# Patient Record
Sex: Male | Born: 1940
Health system: Southern US, Community
[De-identification: ages and names within clinical notes are randomized; demographics above are authoritative.]

## PROBLEM LIST (undated history)

## (undated) DIAGNOSIS — Z9889 Other specified postprocedural states: Secondary | ICD-10-CM

## (undated) DIAGNOSIS — I712 Thoracic aortic aneurysm, without rupture: Secondary | ICD-10-CM

## (undated) DIAGNOSIS — I10 Essential (primary) hypertension: Secondary | ICD-10-CM

## (undated) DIAGNOSIS — Z860100 Personal history of colon polyps, unspecified: Secondary | ICD-10-CM

## (undated) DIAGNOSIS — R42 Dizziness and giddiness: Secondary | ICD-10-CM

## (undated) DIAGNOSIS — R55 Syncope and collapse: Secondary | ICD-10-CM

## (undated) DIAGNOSIS — Z8679 Personal history of other diseases of the circulatory system: Secondary | ICD-10-CM

## (undated) DIAGNOSIS — I77810 Thoracic aortic ectasia: Secondary | ICD-10-CM

## (undated) DIAGNOSIS — M1990 Unspecified osteoarthritis, unspecified site: Secondary | ICD-10-CM

## (undated) DIAGNOSIS — I509 Heart failure, unspecified: Secondary | ICD-10-CM

## (undated) DIAGNOSIS — I351 Nonrheumatic aortic (valve) insufficiency: Secondary | ICD-10-CM

## (undated) DIAGNOSIS — Z953 Presence of xenogenic heart valve: Secondary | ICD-10-CM

## (undated) DIAGNOSIS — M549 Dorsalgia, unspecified: Secondary | ICD-10-CM

## (undated) DIAGNOSIS — I340 Nonrheumatic mitral (valve) insufficiency: Secondary | ICD-10-CM

## (undated) DIAGNOSIS — K219 Gastro-esophageal reflux disease without esophagitis: Secondary | ICD-10-CM

## (undated) DIAGNOSIS — N529 Male erectile dysfunction, unspecified: Secondary | ICD-10-CM

## (undated) DIAGNOSIS — M1712 Unilateral primary osteoarthritis, left knee: Secondary | ICD-10-CM

## (undated) DIAGNOSIS — E785 Hyperlipidemia, unspecified: Secondary | ICD-10-CM

## (undated) DIAGNOSIS — S82142Q Displaced bicondylar fracture of left tibia, subsequent encounter for open fracture type I or II with malunion: Secondary | ICD-10-CM

## (undated) DIAGNOSIS — M11262 Other chondrocalcinosis, left knee: Secondary | ICD-10-CM

## (undated) HISTORY — PX: TONSILLECTOMY: SUR1361

## (undated) HISTORY — DX: Nonrheumatic aortic (valve) insufficiency: I35.1

## (undated) HISTORY — PX: LUMBAR LAMINECTOMY: SHX95

## (undated) HISTORY — PX: LEG SURGERY: SHX1003

## (undated) HISTORY — DX: Male erectile dysfunction, unspecified: N52.9

## (undated) HISTORY — DX: Essential (primary) hypertension: I10

## (undated) HISTORY — DX: Nonrheumatic mitral (valve) insufficiency: I34.0

## (undated) HISTORY — PX: LUMBAR DISC SURGERY: SHX700

## (undated) HISTORY — PX: EYE SURGERY: SHX253

## (undated) HISTORY — DX: Syncope and collapse: R55

## (undated) HISTORY — DX: Other specified postprocedural states: Z98.890

## (undated) HISTORY — DX: Hyperlipidemia, unspecified: E78.5

## (undated) HISTORY — PX: COLONOSCOPY: SHX174

## (undated) HISTORY — DX: Thoracic aortic aneurysm, without rupture: I71.2

## (undated) HISTORY — DX: Heart failure, unspecified: I50.9

## (undated) HISTORY — DX: Thoracic aortic ectasia: I77.810

## (undated) HISTORY — DX: Presence of xenogenic heart valve: Z95.3

---

## 2004-07-31 ENCOUNTER — Ambulatory Visit: Payer: Self-pay | Admitting: Family Medicine

## 2005-01-18 ENCOUNTER — Ambulatory Visit: Payer: Self-pay | Admitting: Family Medicine

## 2006-06-20 ENCOUNTER — Ambulatory Visit: Payer: Self-pay | Admitting: Pulmonary Disease

## 2006-07-24 ENCOUNTER — Ambulatory Visit: Payer: Self-pay | Admitting: Cardiovascular Disease

## 2006-07-24 LAB — CONVERTED CEMR LAB
BUN: 15 mg/dL (ref 6–23)
Chloride: 107 meq/L (ref 96–112)
Creatinine, Ser: 0.8 mg/dL (ref 0.4–1.5)
GFR calc non Af Amer: 103 mL/min
INR: 0.9 (ref 0.9–2.0)
Potassium: 4.2 meq/L (ref 3.5–5.1)
Prothrombin Time: 11.5 s (ref 10.0–14.0)
aPTT: 27.8 s (ref 26.5–36.5)

## 2006-07-26 ENCOUNTER — Ambulatory Visit (HOSPITAL_COMMUNITY): Admission: RE | Admit: 2006-07-26 | Discharge: 2006-07-26 | Payer: Self-pay | Admitting: Cardiology

## 2006-07-26 ENCOUNTER — Encounter: Payer: Self-pay | Admitting: Cardiology

## 2006-07-26 ENCOUNTER — Ambulatory Visit: Payer: Self-pay | Admitting: Cardiology

## 2006-07-31 ENCOUNTER — Ambulatory Visit: Payer: Self-pay | Admitting: Cardiovascular Disease

## 2006-07-31 ENCOUNTER — Inpatient Hospital Stay (HOSPITAL_BASED_OUTPATIENT_CLINIC_OR_DEPARTMENT_OTHER): Admission: RE | Admit: 2006-07-31 | Discharge: 2006-07-31 | Payer: Self-pay | Admitting: Cardiovascular Disease

## 2006-07-31 ENCOUNTER — Encounter: Payer: Self-pay | Admitting: Vascular Surgery

## 2006-07-31 ENCOUNTER — Ambulatory Visit (HOSPITAL_COMMUNITY)
Admission: RE | Admit: 2006-07-31 | Discharge: 2006-07-31 | Payer: Self-pay | Admitting: Thoracic Surgery (Cardiothoracic Vascular Surgery)

## 2006-07-31 HISTORY — PX: CARDIAC CATHETERIZATION: SHX172

## 2006-08-05 ENCOUNTER — Ambulatory Visit: Payer: Self-pay | Admitting: Thoracic Surgery (Cardiothoracic Vascular Surgery)

## 2006-08-12 ENCOUNTER — Encounter
Admission: RE | Admit: 2006-08-12 | Discharge: 2006-08-12 | Payer: Self-pay | Admitting: Thoracic Surgery (Cardiothoracic Vascular Surgery)

## 2006-08-13 ENCOUNTER — Ambulatory Visit: Payer: Self-pay | Admitting: Thoracic Surgery (Cardiothoracic Vascular Surgery)

## 2006-08-13 ENCOUNTER — Inpatient Hospital Stay (HOSPITAL_COMMUNITY)
Admission: RE | Admit: 2006-08-13 | Discharge: 2006-08-18 | Payer: Self-pay | Admitting: Thoracic Surgery (Cardiothoracic Vascular Surgery)

## 2006-08-13 ENCOUNTER — Encounter (INDEPENDENT_AMBULATORY_CARE_PROVIDER_SITE_OTHER): Payer: Self-pay | Admitting: Specialist

## 2006-08-13 DIAGNOSIS — Z953 Presence of xenogenic heart valve: Secondary | ICD-10-CM

## 2006-08-13 DIAGNOSIS — Z9889 Other specified postprocedural states: Secondary | ICD-10-CM

## 2006-08-13 HISTORY — PX: MITRAL VALVE REPAIR: SHX2039

## 2006-08-13 HISTORY — DX: Presence of xenogenic heart valve: Z95.3

## 2006-08-13 HISTORY — DX: Other specified postprocedural states: Z98.890

## 2006-08-13 HISTORY — PX: AORTIC VALVE REPLACEMENT: SHX41

## 2006-08-21 ENCOUNTER — Ambulatory Visit: Payer: Self-pay | Admitting: Cardiology

## 2006-08-27 ENCOUNTER — Ambulatory Visit: Payer: Self-pay | Admitting: Cardiology

## 2006-09-02 ENCOUNTER — Ambulatory Visit: Payer: Self-pay | Admitting: Cardiovascular Disease

## 2006-09-02 LAB — CONVERTED CEMR LAB
BUN: 18 mg/dL (ref 6–23)
Basophils Relative: 0.7 % (ref 0.0–1.0)
CO2: 27 meq/L (ref 19–32)
Calcium: 9.6 mg/dL (ref 8.4–10.5)
Chloride: 102 meq/L (ref 96–112)
Creatinine, Ser: 0.9 mg/dL (ref 0.4–1.5)
Eosinophils Relative: 13 % — ABNORMAL HIGH (ref 0.0–5.0)
GFR calc Af Amer: 109 mL/min
Glucose, Bld: 104 mg/dL — ABNORMAL HIGH (ref 70–99)
Hemoglobin: 10.8 g/dL — ABNORMAL LOW (ref 13.0–17.0)
Lymphocytes Relative: 18.3 % (ref 12.0–46.0)
Monocytes Relative: 9.2 % (ref 3.0–11.0)
Neutro Abs: 4.1 10*3/uL (ref 1.4–7.7)
Platelets: 703 10*3/uL — ABNORMAL HIGH (ref 150–400)
RDW: 13.9 % (ref 11.5–14.6)
WBC: 6.8 10*3/uL (ref 4.5–10.5)

## 2006-09-06 ENCOUNTER — Ambulatory Visit: Payer: Self-pay | Admitting: Cardiology

## 2006-09-13 ENCOUNTER — Ambulatory Visit: Payer: Self-pay | Admitting: Cardiovascular Disease

## 2006-09-20 ENCOUNTER — Encounter: Payer: Self-pay | Admitting: Cardiovascular Disease

## 2006-09-20 ENCOUNTER — Ambulatory Visit: Payer: Self-pay | Admitting: Cardiovascular Disease

## 2006-09-20 ENCOUNTER — Ambulatory Visit: Payer: Self-pay

## 2006-09-27 ENCOUNTER — Ambulatory Visit: Payer: Self-pay | Admitting: Cardiology

## 2006-09-27 LAB — CONVERTED CEMR LAB: Prothrombin Time: 31.2 s (ref 10.0–14.0)

## 2006-09-30 ENCOUNTER — Ambulatory Visit: Payer: Self-pay | Admitting: Thoracic Surgery (Cardiothoracic Vascular Surgery)

## 2006-11-18 ENCOUNTER — Ambulatory Visit: Payer: Self-pay | Admitting: Thoracic Surgery (Cardiothoracic Vascular Surgery)

## 2006-11-21 ENCOUNTER — Ambulatory Visit: Payer: Self-pay | Admitting: Cardiovascular Disease

## 2007-02-10 ENCOUNTER — Ambulatory Visit: Payer: Self-pay | Admitting: Cardiovascular Disease

## 2007-05-09 ENCOUNTER — Ambulatory Visit: Payer: Self-pay | Admitting: Cardiovascular Disease

## 2007-12-24 ENCOUNTER — Encounter: Payer: Self-pay | Admitting: Cardiovascular Disease

## 2007-12-24 ENCOUNTER — Ambulatory Visit: Payer: Self-pay

## 2007-12-24 ENCOUNTER — Ambulatory Visit: Payer: Self-pay | Admitting: Cardiovascular Disease

## 2008-06-10 DIAGNOSIS — Z954 Presence of other heart-valve replacement: Secondary | ICD-10-CM

## 2008-06-10 DIAGNOSIS — I509 Heart failure, unspecified: Secondary | ICD-10-CM | POA: Insufficient documentation

## 2008-06-10 DIAGNOSIS — I38 Endocarditis, valve unspecified: Secondary | ICD-10-CM | POA: Insufficient documentation

## 2008-06-10 DIAGNOSIS — I080 Rheumatic disorders of both mitral and aortic valves: Secondary | ICD-10-CM | POA: Insufficient documentation

## 2008-06-10 DIAGNOSIS — Z9889 Other specified postprocedural states: Secondary | ICD-10-CM | POA: Insufficient documentation

## 2008-06-10 DIAGNOSIS — F528 Other sexual dysfunction not due to a substance or known physiological condition: Secondary | ICD-10-CM

## 2008-06-10 DIAGNOSIS — E785 Hyperlipidemia, unspecified: Secondary | ICD-10-CM | POA: Insufficient documentation

## 2008-06-10 DIAGNOSIS — I10 Essential (primary) hypertension: Secondary | ICD-10-CM | POA: Insufficient documentation

## 2008-06-10 DIAGNOSIS — Z9089 Acquired absence of other organs: Secondary | ICD-10-CM

## 2008-06-11 ENCOUNTER — Encounter: Payer: Self-pay | Admitting: Cardiovascular Disease

## 2008-06-11 ENCOUNTER — Ambulatory Visit: Payer: Self-pay | Admitting: Cardiovascular Disease

## 2009-01-17 ENCOUNTER — Ambulatory Visit: Payer: Self-pay | Admitting: Cardiovascular Disease

## 2009-07-05 ENCOUNTER — Ambulatory Visit: Payer: Self-pay | Admitting: Cardiovascular Disease

## 2010-04-23 ENCOUNTER — Encounter: Payer: Self-pay | Admitting: Thoracic Surgery (Cardiothoracic Vascular Surgery)

## 2010-05-04 NOTE — Assessment & Plan Note (Signed)
Summary: f79m   Visit Type:  6 months follow up Primary Provider:  [facility]  CC:  No cardiac complaints.  History of Present Illness: This is a 70 year old gentleman with mixed valvular heart disease. He underwent bioprosthetic aortic valve replacement and mitral valve repair in 2008. The primary valve lesion was aortic insufficiency with mitral regurgitation secondary to annular dilatation. His left ventricular function, which was originally depressed with LVEF 35-45%, has recovered. His most recent assessment by echocardiography September 2009 showed an LVEF of 55-60% with normal functioning bioprosthetic aortic valve and normal gradients across is mitral annular ring.  The patient is doing well at present.  He denies chest pain, dyspnea, orthopnea, PND, edema, palpitations, lightheadedness, or syncope.   Current Medications (verified): 1)  Hydrochlorothiazide 25 Mg Tabs (Hydrochlorothiazide) .... Take One Tablet By Mouth Daily. 2)  Lisinopril 40 Mg Tabs (Lisinopril) .... Take 1 Tablet By Mouth Once A Day 3)  Aspirin 81 Mg Tbec (Aspirin) .... Take One Tablet By Mouth Daily 4)  Metoprolol Succinate 50 Mg Xr24h-Tab (Metoprolol Succinate) .... Take 1/2 Tablet Daily 5)  Simvastatin 20 Mg Tabs (Simvastatin) .... Take One Tablet By Mouth Daily At Bedtime  Allergies (verified): No Known Drug Allergies  Past History:  Past medical history reviewed for relevance to current acute and chronic problems.  Past Medical History: Reviewed history from 01/17/2009 and no changes required. MITRAL REGURGITATION (ICD-396.3), s/p MV repair AORTIC INSUFFICIENCY, HX OF (ICD-424.1), s/p bioprosthetic AVR HYPERTENSION (ICD-401.9) CHF (ICD-428.0) DYSLIPIDEMIA (ICD-272.4) ERECTILE DYSFUNCTION (ICD-302.72) VALVULAR HEART DISEASE (ICD-424.90)    Review of Systems       Negative except as per HPI   Vital Signs:  Patient profile:   70 year old male Height:      66 inches Weight:       209.25 pounds BMI:     33.90 Pulse rate:   84 / minute Pulse rhythm:   regular Resp:     18 per minute BP sitting:   119 / 78  (left arm) Cuff size:   large  Vitals Entered By: Vikki Ports (July 05, 2009 4:21 PM)  Physical Exam  General:  Pt is alert and oriented, in no acute distress. HEENT: normal Neck: normal carotid upstrokes without bruits, JVP normal Lungs: CTA CV: RRR without murmur or gallop Abd: soft, NT, positive BS, no bruit, no organomegaly Ext: no clubbing, cyanosis, or edema. peripheral pulses 2+ and equal Skin: warm and dry without rash    EKG  Procedure date:  07/05/2009  Findings:      Normal sinus rhythm, borderline LVH, heart rate 84 beats per minute   Impression & Recommendations:  Problem # 1:  AORTIC INSUFFICIENCY, HX OF (ICD-424.1) Status post aortic valve replacement with bioprosthetic valve.  He remains New [place] Heart Association class I. Recommend continue current medical program at present. His updated medication list for this problem includes:    Hydrochlorothiazide 25 Mg Tabs (Hydrochlorothiazide) .Marland Kitchen... Take one tablet by mouth daily.    Lisinopril 40 Mg Tabs (Lisinopril) .Marland Kitchen... Take 1 tablet by mouth once a day    Metoprolol Succinate 50 Mg Xr24h-tab (Metoprolol succinate) .Marland Kitchen... Take 1/2 tablet daily  Problem # 2:  HYPERTENSION (ICD-401.9)  Well-controlled on current medical program. His updated medication list for this problem includes:    Hydrochlorothiazide 25 Mg Tabs (Hydrochlorothiazide) .Marland Kitchen... Take one tablet by mouth daily.    Lisinopril 40 Mg Tabs (Lisinopril) .Marland Kitchen... Take 1 tablet by mouth once a day  Aspirin 81 Mg Tbec (Aspirin) .Marland Kitchen... Take one tablet by mouth daily    Metoprolol Succinate 50 Mg Xr24h-tab (Metoprolol succinate) .Marland Kitchen... Take 1/2 tablet daily  Orders: EKG w/ Interpretation (93000)  BP today: 119/78 Prior BP: 114/74 (01/17/2009)  Labs Reviewed: K+: 4.0 (09/02/2006) Creat: : 0.9 (09/02/2006)     Patient  Instructions: 1)  Your physician recommends that you continue on your current medications as directed. Please refer to the Current Medication list given to you today. 2)  Your physician wants you to follow-up in:   1 YEAR. You will receive a reminder letter in the mail two months in advance. If you don't receive a letter, please call our office to schedule the follow-up appointment.

## 2010-07-17 ENCOUNTER — Other Ambulatory Visit: Payer: Self-pay | Admitting: Cardiovascular Disease

## 2010-07-18 ENCOUNTER — Encounter: Payer: Self-pay | Admitting: Cardiovascular Disease

## 2010-07-19 ENCOUNTER — Ambulatory Visit (INDEPENDENT_AMBULATORY_CARE_PROVIDER_SITE_OTHER): Payer: Medicare Other | Admitting: Cardiovascular Disease

## 2010-07-19 ENCOUNTER — Encounter: Payer: Self-pay | Admitting: Cardiovascular Disease

## 2010-07-19 VITALS — BP 114/78 | HR 74 | Resp 18 | Ht 67.0 in | Wt 205.8 lb

## 2010-07-19 DIAGNOSIS — I359 Nonrheumatic aortic valve disorder, unspecified: Secondary | ICD-10-CM

## 2010-07-19 DIAGNOSIS — I10 Essential (primary) hypertension: Secondary | ICD-10-CM

## 2010-07-19 DIAGNOSIS — I38 Endocarditis, valve unspecified: Secondary | ICD-10-CM

## 2010-07-19 NOTE — Assessment & Plan Note (Signed)
Patient with mixed valvular heart disease status post aortic valve replacement and mitral valve repair. He's had normalization of LV function and has no recurrent symptoms. We'll continue his current medical program and followup in one year. I've encouraged him to initiate an exercise program.

## 2010-07-19 NOTE — Patient Instructions (Signed)
Your physician wants you to follow-up in: 1 YEAR.  You will receive a reminder letter in the mail two months in advance. If you don't receive a letter, please call our office to schedule the follow-up appointment.  Your physician recommends that you continue on your current medications as directed. Please refer to the Current Medication list given to you today.  

## 2010-07-19 NOTE — Assessment & Plan Note (Signed)
Blood pressure is well controlled on current medical regimen.

## 2010-07-19 NOTE — Progress Notes (Signed)
HPI:  This is a 70 year old gentleman presented for follow up of valvular heart disease. He initially presented in 2008 with congestive heart failure. He was found to have severely reduced left ventricular systolic dysfunction, severe aortic insufficiency, and moderately severe mitral insufficiency. The patient underwent aortic valve replacement using a 25 mm pericardial tissue valve and also underwent mitral valve repair using a 26 mm annuloplasty ring.  He has done well since surgery. Patient has had no symptoms of recurrent congestive heart failure. He specifically denies chest pain, dyspnea, lightheadedness, edema, or palpitations. He denies leg swelling, orthopnea, or PND. He notes good control of his blood pressure. His lipids have been followed regularly by his primary care physician. He has no complaints today. He has not engaged in any regular exercise.  Outpatient Encounter Prescriptions as of 07/19/2010  Medication Sig Dispense Refill  . aspirin 81 MG tablet Take 81 mg by mouth daily.        . hydrochlorothiazide 25 MG tablet Take 25 mg by mouth daily.        Marland Kitchen lisinopril (PRINIVIL,ZESTRIL) 40 MG tablet Take 40 mg by mouth daily.        . metoprolol (TOPROL-XL) 50 MG 24 hr tablet TAKE ONE-HALF TABLET BY MOUTH EVERY DAY  30 tablet  5  . simvastatin (ZOCOR) 20 MG tablet Take 20 mg by mouth at bedtime.        Marland Kitchen DISCONTD: metoprolol (TOPROL-XL) 50 MG 24 hr tablet Take 25 mg by mouth daily.          No Known Allergies  Past Medical History  Diagnosis Date  . Mitral regurgitation     s/p MV repair  . Aortic insufficiency   . HTN (hypertension)   . CHF (congestive heart failure)   . Dyslipidemia   . ED (erectile dysfunction)   . Valvular heart disease     ROS: Negative except as per HPI  BP 114/78  Pulse 74  Resp 18  Ht 5\' 7"  (1.702 m)  Wt 205 lb 12.8 oz (93.35 kg)  BMI 32.23 kg/m2  PHYSICAL EXAM: Pt is alert and oriented, overweight male, NAD HEENT: normal Neck: JVP -  normal, carotids 2+= without bruits Lungs: CTA bilaterally CV: RRR with a grade 2/6 systolic ejection murmur along the left sternal border Abd: soft, obese, NT, Positive BS, no hepatomegaly Ext: no C/C/E, distal pulses intact and equal Skin: warm/dry no rash  EKG:  Normal sinus rhythm with sinus arrhythmia, 75 beats per minute, moderate voltage criteria for LVH maybe normal variant.  ASSESSMENT AND PLAN:

## 2010-08-15 NOTE — Assessment & Plan Note (Signed)
[facility]                            CARDIOLOGY OFFICE NOTE   JARRIS, KORTZ                       MRN:          [id number]  DATE:02/10/2007                            DOB:          [birth year]    Jhon Mallozzi was seen in followup at the [facility] Cardiology Office on  February 10, 2007.  Mr. Cozzolino is a very nice 70 year old gentleman with  aortic and mitral valve insufficiency status post aortic valve  replacement and mitral valve repair in May of this year.  Mr. Pelzer is  going quite well.  He is asymptomatic.  He specifically denies chest  pain, dyspnea, edema, orthopnea or PND.  His biggest problem has been  that of blood pressure control.  His blood pressure readings have been  markedly elevated with systolic pressures in the 170s and 180s and  diastolic pressures greater than 100.  He has misplaced his  hydrochlorothiazide and has not been taking that for the last 2 weeks.  Otherwise, he has had no problems.  He denies headaches, vision changes  or other complaints.   CURRENT MEDICATIONS:  1. Crestor 10 mg daily.  2. Aspirin 81 mg daily.  3. Hydrochlorothiazide 12.5 mg daily (not taking over past 2 weeks).  4. Metoprolol succinate 100 mg daily.  5. Diovan 160 mg daily.   ALLERGIES:  NKDA.   PHYSICAL EXAM:  The patient is alert and oriented and in no acute  distress.  Weight is 200 pounds.  Blood pressure 184/104, heart rate 51,  respiratory rate 16.  HEENT:  Normal.  NECK:  Normal carotid upstrokes without bruits.  Jugular venous pressure  normal.  LUNGS:  Clear to auscultation bilaterally.  HEART:  Bradycardic and regular.  There is an S4 gallop, there are no  diastolic murmurs.  ABDOMEN:  Soft, nontender, no organomegaly.  EXTREMITIES:  No clubbing, cyanosis or edema.  Peripheral pulses 2+ and  equal throughout.   EKG shows sinus bradycardia with left ventricular hypertrophy.  There  are no significant ST segment or T wave  changes.   ASSESSMENT:  1. Aortic and mitral valve insufficiency.  Patient status post      bioprosthetic aortic valve replacement and mitral valve repair.  He      is doing well from a cardiac standpoint.  His echocardiogram on      June 20th showed mild diffuse left ventricular hypokinesis with a      left ventricular ejection fraction estimated between 35 and 45      percent.  The transvalvular aortic gradient was normal at 8      millimeters of mercury and the mitral valve ring appeared normal      with mild mitral regurgitation.  2. Hypertension with poor control.  Mr. Gauthreaux blood pressures remain      markedly elevated.  I am going to resume hydrochlorothiazide at a      higher dose of 25 milligrams daily.  I also planned on increasing      his Diovan from 160 to 320 milligrams daily.  He tells me that  he      did not tolerate the 320 milligram dose of Diovan due to side      effects.  I am going to switch him to a more potent angiotensin      receptor blocker with Micardis at a dose of 40 milligrams daily to      start.  He will follow up closely next week with his primary care      physician, Dr. Francesca Jewett.  He is going to have labs drawn at that      visit.  Also, he will continue to record his blood pressures at      home, which he has been doing regularly.  I asked him to call the      office if he continues to have significant elevation of his blood      pressure readings.  He will return for followup in 3 months.  3. Dyslipidemia.  He is due for lipids and liver function tests.  He      is currently taking Crestor.  He was written a prescription so that      they could be drawn with his blood work at Dr. Jeanne Ivan office next      week.     [name]. [name], MD  Electronically Signed    MDC/MedQ  DD: 02/10/2007  DT: 02/10/2007  Job #: [id number]   cc:   Lise Auer

## 2010-08-15 NOTE — Assessment & Plan Note (Signed)
[facility]                            CARDIOLOGY OFFICE NOTE   Thomas Henderson, Thomas Henderson                       MRN:          [id number]  DATE:05/09/2007                            DOB:          [birth year]    REASON FOR VISIT:  Thomas Henderson was seen in followup at the [facility]  Cardiology office on May 09, 2007.  Thomas Henderson is a very, nice 70-  year-old gentleman with valvular heart disease and cardiomyopathy.  He  underwent aortic valve replacement of mitral valve repair by Dr. Cornelius Moras in  May 2008.  Thomas Henderson is doing very well from a symptomatic standpoint.  He has had LV dysfunction with an EF in the range of 35-45%.  He has had  difficulty controlling his blood pressure, but more recently has been  doing very well after some changes were made in his medical regimen.  His blood pressure readings have been in good range at home.  He had no  chest pain, dyspnea, orthopnea, PND or edema.  He does complain of some  lower leg pain that is not related to walking.  His pain is most  bothersome at night.   MEDICATIONS:  1. Hydrochlorothiazide 25 mg daily.  2. Lisinopril 40 mg daily.  3. Crestor 10 mg daily.  4. Aspirin 81 mg daily.  5. Metoprolol 50 mg twice daily.   ALLERGIES:  No known drug allergies.   PHYSICAL EXAMINATION:  GENERAL:  The patient is alert and oriented in no  acute distress.  HEENT:  Weight 202, blood pressure 118/70, heart rate 72.  HEENT:  Normal.  NECK:  Normal carotid upstrokes without bruits.  Jugular venous pressure  normal.  LUNGS:  Clear bilaterally.  HEART:  Regular rate and rhythm with a grade 2/6 systolic ejection  murmur along the left sternal border.  There are no gallops or diastolic  murmurs.  ABDOMEN:  Soft, nontender no organomegaly.  EXTREMITIES:  No clubbing, cyanosis or edema.  Peripheral pulses are 2+  and equal.   ASSESSMENT:  1. Valvular heart disease.  Thomas Henderson is stable from a cardiac      standpoint.   He is asymptomatic at present.  Continue current      medical therapy.  Reassess left ventricular function at his next      return visit in 6 months with a 2-D echocardiogram.  2. Congestive heart failure secondary to valvular heart disease.  As      above, he is stable from a cardiac standpoint.  Will continue his      lisinopril and long-acting metoprolol without changes.  3. Hypertension.  Blood pressure under ideal control.  4. Dyslipidemia.  He is treated with low-dose Crestor.  His lipids are      managed by Dr. Rande Lawman.  It is possible that he is having some      myalgias from his Crestor.  They do not seem to be too bothersome      at this point.  I told him he could try some Coenzyme Q-10 as this  can be      helpful.  5. For followup, I will see Thomas Henderson back in 6 months or sooner if      any new problems arise.     [name]. [name], MD  Electronically Signed    MDC/MedQ  DD: 05/09/2007  DT: 05/12/2007  Job #: [id number]   cc:   Lise Auer

## 2010-08-15 NOTE — Assessment & Plan Note (Signed)
OFFICE VISIT   JGUADALUPE, OPIELA  DOB:  [birth year]                                        September 30, 2006  CHART #:  [id number]   HISTORY OF PRESENT ILLNESS:  [name] returns to the office today for  routine followup, status post aortic valve replacement and mitral valve  repair on Aug 13, 2006.  His initial postoperative recovery was  uneventful.  Following hospital discharge, Mr. Heffley had been seen in  followup on 2 occasions by Dr. [name] at the [facility] office.  Initially, he was noted to have mild symptomatic hypotension with sinus  tachycardia, and his diuretics were discontinued and his Diovan was put  on hold.  He was seen again in followup on June 20 by Dr. [name] and  notably feeling much better.  At that point, he was back taking Diovan,  although he was no longer taking any beta blocker.  He was still  somewhat tachycardic and a low dose of Toprol-XL was prescribed at that  time.  Mr. Silveria returns to our office today for further followup.  At  this point, he feels well.  He denies any tachy-palpitations or dizzy  spells.  He has had very mild soreness in his chest.  He has not had any  significant shortness of breath, orthopnea, nor lower extremity edema.  His appetite is good.  He has had problems with getting his Coumadin  adjusted and apparently he has been severely supratherapeutic off and on  on at least 2 occasions.  However, he has not had any bleeding  complications.  Overall, he feels well and looks well.  He has no  complaints.   CURRENT MEDICATIONS:  1. Diovan 320 mg daily.  2. Crestor 10 mg daily.  3. Warfarin as directed.  4. Aspirin 81 mg daily.   Mr. Burgard was given a prescription for Toprol-XL 25 mg to be taken at  bedtime and his dose of Diovan was decreased to 160 mg daily at the time  of his last visit with Dr. [name].  He has still not gotten these  prescriptions in the mail, so he is still not taking a beta  blocker at  this point in time.   PHYSICAL EXAMINATION:  GENERAL/VITAL SIGNS:  Exam is notable for a well-  appearing male with blood pressure of 140/88, pulse 111 and regular,  oxygen saturation 99% on room air.  CHEST:  Examination of the chest reveals a median sternotomy incision  that has healed nicely.  The sternum is stable on palpation.  Breath  sounds are clear to auscultation bilaterally.  No wheezes or rhonchi are  noted.  CARDIOVASCULAR:  Regular rate and rhythm.  No murmurs, rubs, or gallops  are appreciated.  ABDOMEN:  Soft and nontender.  EXTREMITIES:  Warm and well-perfused.  There is no lower extremity  edema.   The remainder of his physical exam is unrevealing.   DIAGNOSTIC TESTS:  Report from 2-D echocardiogram performed at the  [facility] Cardiology office on June 20 is reviewed; this is notable for  moderate left ventricular dysfunction with ejection fraction estimated  between 35% and 45%.  There is a normally-functioning bioprosthetic  tissue valve in the aortic position with transvalvular gradient  estimated at 8 mmHg.  There is a mitral annuloplasty ring in position  with no mitral regurgitation.  No other abnormalities are noted of  significance.   Mr. Cansler also apparently had a chest x-ray performed at the [facility]  Cardiology office on June 20; the results of this test are not currently  available and he did not bring it with him to the office for [place] to  review today.   IMPRESSION:  Satisfactory progress following recent aortic valve  replacement and mitral valve repair.  Mr. Koskela appears to be doing  quite well.  He has still not filled his prescription for Toprol-XL as  previously prescribed by Dr. [name].  He is waiting for a 90-day mail  order supply that he ordered to arrive.  He continues to have trouble  with Coumadin and has had apparently significant trouble trying to get  his INR under control.  He is far enough out from the surgery at this   point that I suspect the potential benefits of Coumadin are less than  the associated risks under the circumstances.  He plans to start the  cardiac rehabilitation program tomorrow.  Overall, he looks well.   PLAN:  I have given Mr. Ratz a 2-week supply of Toprol-XL 25 mg daily  to take at bedtime as previously suggested by Dr. [name]; hopefully,  this will be enough to get him through until he receives his supply of  Toprol-XL in the mail as previously ordered.  I have instructed him to  go ahead and stop taking Coumadin.  Under the circumstances, I feel that  the potential risks probably outweigh the benefits at this point.  I  have encouraged Mr. Mells to continue to gradually increase his physical  activity as tolerated with his only limitation at this point remaining  that he refrain from heavy lifting or strenuous use of his arms or  shoulders for at least  another 6 weeks.  All of his questions have been addressed.  In the  future, he will call and return to see [place] here at [facility] as needed.   Salvatore Decent. Cornelius Moras, M.D.  Electronically Signed   CHO/MEDQ  D:  09/30/2006  T:  10/01/2006  Job:  [id number]   cc:   [name]. [name], MD  Lise Auer

## 2010-08-15 NOTE — Cardiovascular Report (Signed)
NAMEKEANDRE, LINDEN                ACCOUNT NO.:  [id number]   MEDICAL RECORD NO.:  [id number]          PATIENT TYPE:  OIB   LOCATION:  1962                         FACILITY:  [facility]   PHYSICIAN:  [name]. [name], MD  DATE OF BIRTH:  [birth year]   DATE OF PROCEDURE:  07/31/2006  DATE OF DISCHARGE:                            CARDIAC CATHETERIZATION   PROCEDURE:  Right heart catheterization, left heart catheterization,  selective coronary angiography, left ventricular angiography and aortic  root angiography.   INDICATIONS:  [name] is a 70 year old gentleman who was evaluated in  consultation on April23,2008.  He has recently presented with congestive  heart failure and was found to have valvular heart disease.  His  physical exam was suggestive of severe aortic insufficiency.  He  underwent a transesophageal echo on April25,2008 which showed moderate  global LV dysfunction with an LVEF estimated between 30 and 35%.  He was  also found to have moderate aortic regurgitation with an eccentric jet,  moderate aortic root dilatation of 5 cm at the level of the cusps and  moderate mitral regurgitation.  He was referred for right and left heart  catheterization to assess his hemodynamics and better assess his aortic  insufficiency as well as the presence of coronary artery disease in the  setting of his valvular heart disease.   DESCRIPTION OF PROCEDURE:  Risks and indications of the procedure were  explained to the patient.  Informed consent was obtained.  The right  groin was prepped, draped and anesthetized with 1% lidocaine.  Using  modified Seldinger technique a 4-French sheath was placed in the right  femoral artery and a 6-French sheath was placed in the right femoral  vein.  The right heart catheterization was performed using an end-hole  multipurpose catheter.  Pressures were recorded from the right atrium to  the pulmonary capillary wedge position.  Oxygen saturations were  drawn  from the superior vena cava, pulmonary artery and right femoral artery.  Fick cardiac output was calculated.  Following the right heart  catheterization, an angled pigtail catheter was inserted into the left  ventricle and pressures were recorded.  A left ventriculogram was  performed.  Pullback across the aortic valve was done and an aortic root  angiogram was done.  Following this, selective coronary angiography was  performed.  For the left coronary artery a 4-French JL-5 catheter was  used, for the right coronary artery a 4-French 3-D RC catheter was used.  At completion of the procedure, the sheaths were pulled and manual  pressure used for hemostasis.  All catheter exchanges were performed  over a guidewire.  There were no complications.   FINDINGS:  Right atrial pressure A-wave 2, V-wave 1, mean of 0.  Right  ventricular pressure is 35/0 with an end-diastolic pressure of 2.  Pulmonary artery pressure is 38/13 with a mean of 23.  Pulmonary  capillary wedge pressures A-wave 10, V-wave 8, mean of 6.  Left  ventricular pressure is 110/3 with an end-diastolic pressure of 7.  Aortic pressure is 111/49 with a mean of 82.  Oxygen  saturation's:  superior vena cava 72%, pulmonary artery 69%, aorta 96%.  Fick cardiac  output was 5.2 liters per minute.   Coronary angiography.  The left mainstem is large and there is no  disease present.  It bifurcates into the LAD and left circumflex.   The LAD is a large-caliber vessel that courses down and wraps around the  LV apex.  There is a large first diagonal branch present.  There is no  significant angiographic disease throughout the LAD or its diagonal  branches.   The left circumflex is large-caliber.  It is codominant and there are  two small marginal branches followed by a left PDA and a left  posterolateral branch.  There is no significant angiographic disease in  the left circumflex system.   The right coronary artery is also a  large-caliber vessel.  There is  diffuse nonobstructive disease in this vessel.  There is a right PDA as  well that has nonobstructive disease.  The PDA is small in caliber.  There is a very small posterolateral branch from the right coronary  artery.   Left ventriculography performed in a 30 degrees RAO projection  demonstrates moderate global left ventricular systolic dysfunction with  an estimated LVEF of 35%.   Aortic root angiography performed in the LAO projection demonstrates a  dilated aortic root involving the cusps as well as the proximal root of  the aorta.  Angiographically, there is severe aortic insufficiency  present.   ASSESSMENT:  1. Dilated aortic root with severe aortic insufficiency.  2. Moderate global left ventricular systolic dysfunction.  3. Minor nonobstructive coronary artery disease.  4. Normal right heart hemodynamics.   PLAN:  After reviewing Mr. Maye studies, I suspect that he has the LV  dysfunction from longstanding aortic root insufficiency.  He has a  significantly dilated aortic root as well as aortic valve insufficiency.  He has had some degree of mitral regurgitation seen by TEE and this may  be secondary to his dilated ventricle.  I am going to ask for a  consultation by one of our heart surgeons in anticipation of Mr. Revoir  requiring aortic root and valve surgery as well as the possibility of  mitral valve repair.  We will continue his current medical therapy with  a reduction in his Lasix dose as he has very low filling pressures.      [name]. [name], MD  Electronically Signed     MDC/MEDQ  D:  07/31/2006  T:  07/31/2006  Job:  [id number]   cc:   Lise Auer

## 2010-08-15 NOTE — Op Note (Signed)
[name], Thomas Henderson                ACCOUNT NO.:  [id number]   MEDICAL RECORD NO.:  [id number]          PATIENT TYPE:  INP   LOCATION:  2311                         FACILITY:  [facility]   PHYSICIAN:  Salvatore Decent. Cornelius Moras, M.D. DATE OF BIRTH:  Oct 19, 1940   DATE OF PROCEDURE:  08/13/2006  DATE OF DISCHARGE:                               OPERATIVE REPORT   PREOPERATIVE DIAGNOSES:  1. Severe aortic insufficiency.  2. Moderate to severe mitral regurgitation.   POSTOPERATIVE DIAGNOSES:  1. Severe aortic insufficiency.  2. Moderate to severe mitral regurgitation.   PROCEDURE:  Median sternotomy for aortic valve replacement (25-mm  Edwards Magna pericardial tissue valve) and mitral valve repair (26-mm  Edwards physio ring annuloplasty).   SURGEON:  Salvatore Decent. Cornelius Moras, MD   ASSISTANT:  Salvatore Decent. Dorris Fetch, MD   ANESTHESIA:  General.   BRIEF CLINICAL NOTE:  The patient is a 70 year old African American male  with a history of hypertension, who presents with progressive symptoms  of shortness of breath and congestive heart failure.  Echocardiogram  demonstrates severe aortic insufficiency with moderate to severe mitral  regurgitation.  There is left ventricular chamber dilatation and  moderate to severe left ventricular dysfunction.  Left and right heart  catheterization demonstrate no significant coronary artery disease with  normal coronary artery anatomy.  There is pulmonary hypertension.  CT  angiogram of the thoracic aorta demonstrates mild aneurysmal dilatation  of the ascending thoracic aorta and aortic root, but the maximum  transverse diameter and is less than or equal to 3.5 cm.  A full  consultation note has been dictated previously.  The patient and his  family have been counseled at length regarding the indications, risks,  and potential benefits of surgery.  Alternative treatment strategies  have been discussed.  They understand and accept all potential  associated risks  including but not limited to risk of death, stroke,  myocardial infarction, congestive heart failure, respiratory failure,  pneumonia, bleeding requiring blood transfusion, arrhythmia, heart block  or bradycardia requiring permanent pacemaker, late complications related  to valve replacement or valve repair.  After considerable discussion,  the patient specifically requests that a bioprosthetic tissue valve be  utilized for aortic valve replacement in an effort to avoid the need for  long-term anticoagulation with Coumadin.  He understands that associated  with this there is a small but significant risk of late structural valve  deterioration and possible failure.  All of his questions have been  addressed.   OPERATIVE FINDINGS:  1. Moderate left ventricular dysfunction with dilated and      hypertrophied left ventricle, ejection fraction estimated at 40%.  2. Severe aortic insufficiency.  3. Moderate mitral regurgitation.  4. Mild dilatation of the aortic root and the ascending thoracic      aorta.  5. No residual aortic insufficiency and no residual mitral      regurgitation after successful mitral valve repair and the aortic      valve replacement.   OPERATIVE NOTE IN DETAIL:  The patient was brought to the operating room  on the above-mentioned date and  central monitoring was established by  the anesthesia service under the care and direction of Dr. Bedelia Person.  Specifically, a Swan-Ganz catheter is placed through the right internal  jugular approach.  A radial arterial line is placed.  Intravenous  antibiotics are administered.  Following induction with general  endotracheal anesthesia, a Foley catheter is placed.  The patient's  chest, abdomen, both groins, and both lower extremities are prepared and  draped in a sterile manner.   Baseline transesophageal echocardiogram is performed by Dr. Gypsy Balsam.  Findings are similar to the echocardiogram performed preoperatively.  There is  severe aortic insufficiency.  There is moderate mitral  regurgitation.  There is dilatation and enlargement of the left  ventricular chamber with moderate left ventricular hypertrophy as well.  There is no mitral valve prolapse.  There is inadequate coaptation of  the mitral valve leaflets due to annular dilatation.  There is  enlargement of the sinuses of Valsalva and mild enlargement of the  ascending thoracic aorta.  No other abnormalities are noted.   A median sternotomy incision is performed.  The pericardium is opened.  The ascending aorta is mildly enlarged.  There is no atherosclerotic  plaque or calcification appreciated.  The patient is heparinized  systemically.  The ascending aorta is cannulated.  A venous cannula is  placed in the superior vena cava.  A second venous cannula is placed low  in the right atrium with tip extending down the inferior vena cava.  A  retrograde cardioplegia catheter is placed through the right atrium into  the coronary sinus.  Adequate heparinization is verified.   Cardiopulmonary bypass is begun.  A cardioplegia catheter is placed in  the ascending aorta.  The surface of the heart is inspected.  The left  ventricle is enlarged and dilated.  A temperature probe is placed in the  left ventricular septum.  The patient is cooled to 32 degrees systemic  temperature.  The aortic crossclamp is applied and cold blood  cardioplegia is administered initially in an antegrade fashion through  the aortic root.  Cardioplegia is subsequently alternated with  retrograde cardioplegia through the coronary sinus catheter.  Iced  saline slush is applied for topical hypothermia.  The initial  cardioplegic arrest and myocardial cooling is felt to be satisfactory.  Repeat doses of cardioplegia are administered intermittently throughout  the crossclamp portion of the operation through the aortic root, retrograde through the coronary sinus catheter, and ultimately using   handheld cannulas directly into the ostium of the right and left main  coronary arteries to maintain left ventricular septal temperature below  15 degrees centigrade.   The left atriotomy incision is performed posteriorly through the  interatrial groove.  The mitral valve is exposed using a self-retaining  retractor.  The mitral valve annulus is dilated.  The mitral valve  appears somewhat floppy, but there is no prolapse per se involving any  significant segment of either anterior or posterior leaflet.  The  anterior leaflet is relatively small in size.  The functional anatomy is  consistent primarily with annular dilatation (functional class I) as  well as some downward displacement of the papillary muscles due to the  enlarged left ventricular chamber with secondary type III-B restriction  of the posterior leaflet.   Ring annuloplasty is performed using interrupted 2-0 Ethibond horizontal  mattress sutures placed circumferentially around the entire mitral  annulus.  The mitral valve is sized to somewhere between a 26 and a 28  mm  ring based upon the surface area of the anterior leaflet.  This  smaller size is chosen given the functional anatomy.  A 26-mm Edwards  physio ring (model number [id number], serial number [id number]) is secured in  place uneventfully.  After completion of the valve repair, the valve is  tested with iced saline into the left ventricular chamber and appears to  be perfectly competent.   The left atrial appendage is oversewn from within the left atrium using  a two-layer closure of running 3-0 Prolene suture.   The left atriotomy is closed using a two-layer closure of running 3-0  Prolene suture.  A left ventricular vent is left across the mitral valve  to remain during the aortic valve replacement portion of the operation.   An oblique aortotomy incision is performed.  The aortic valve is  exposed.  The sinuses of Valsalva are somewhat dilated but there is no  true  aneurysm present.  The left main and the right coronary artery are  both in their normal anatomical orientation and well away from the  aortic valve.  The aortic valve is tricuspid.  The aortic valve is  severely insufficient primarily due to prolapse of the right cusp of the  aortic valve.  There is also a web and fenestration on the left cusp of  the aortic valve contributing to the aortic insufficiency.  The aortic  valve was excised sharply.  There is no significant calcification in the  aortic annulus.  The aortic annulus is sized to accept a 25-mm stented  bioprosthetic valve.   Aortic valve replacement is performed using interrupted 2-0 Ethibond  horizontal mattress pledgeted sutures with pledgets in the subannular  position.  An [employer] bovine pericardial tissue valve (model  number 3000, serial number [id number]) is secured in place uneventfully. After completion of the valve replacement, the valve is carefully  inspected make sure it is completely seated.  The valve is well away  from the left main and right coronary arteries.  Rewarming is begun.  The aortotomy is closed using a two-layer closure of running 4-0 Prolene  suture.   The patient is placed in Trendelenburg position.  One final dose of warm  retrograde hot-shot cardioplegia is administered.  The lungs are  ventilated and the heart allowed to fill to evacuate any residual air  through the aortic root.  The aortic crossclamp is removed after a total  crossclamp time of 129 minutes.   The heart begins to beat spontaneously without need for cardioversion.  The left ventricular vent is removed.  Epicardial pacing wires are fixed  to the right ventricular free wall and to the right atrial appendage.  The patient is rewarmed to 37 degrees centigrade temperature.  Normal  sinus rhythm resumes spontaneously.  The IVC cannula is removed and its  cannulation site oversewn with Prolene suture.  The patient is weaned  from  cardiopulmonary bypass without difficulty.  The patient's rhythm at  separation from bypass is normal sinus rhythm.  Total cardiopulmonary  bypass time for the operation is 151 minutes.  The patient is weaned  from bypass on low-dose dopamine and milrinone infusions.   Follow-up transesophageal echocardiogram performed by Dr. Gypsy Balsam after  separation from bypass demonstrates preserved left ventricular function  with no significant wall motion abnormalities.  There is a normally-  functioning bioprosthetic valve in the aortic position.  There is no  sign of aortic insufficiency, nor is there any sign of any perivalvular  leak.  There is a well-seated annuloplasty ring in the mitral valve.  There is no residual mitral regurgitation at all.  There is no  significant residual air.  No other abnormalities are noted.   The venous and arterial cannulae are removed uneventfully.  Protamine is  administered to reverse the anticoagulation.  The mediastinum is  irrigated with saline solution containing vancomycin.  Meticulous  surgical hemostasis is ascertained.  The mediastinum is drained with two  chest tubes exited through separate stab incisions inferiorly.  The  pericardium and soft tissues anterior to the aorta are reapproximated  loosely.  The On-Q continuous pain management system is utilized to  facilitate postoperative pain control.  Two 10-inch catheters supplied  with the On-Q kit are tunneled into the deep subcutaneous tissues and  positioned just lateral to the lateral border of the sternum on either  side.  Each catheter is flushed with 5 mL of 0.5% bupivacaine solution  and ultimately they are connected to a continuous infusion pump.  The  sternum is closed with double-strength sternal wire.  The soft tissues  anterior to the sternum are closed in multiple layers and the skin is  closed with a running subcuticular skin closure.  The patient tolerated the procedure well and is  transported to the  surgical intensive care unit in stable condition.  There are no  intraoperative complications.  All sponge, instrument and needle counts  are verified correct at completion of the operation.  No blood products  were administered.      Salvatore Decent. Cornelius Moras, M.D.  Electronically Signed     CHO/MEDQ  D:  08/13/2006  T:  08/13/2006  Job:  [id number]   cc:   [name]. [name], MD  Lise Auer

## 2010-08-15 NOTE — Assessment & Plan Note (Signed)
[facility]                            CARDIOLOGY OFFICE NOTE   Thomas, Henderson                       MRN:          [id number]  DATE:09/20/2006                            DOB:          21-Jan-1941    Thomas Henderson was seen in followup at the [facility] Cardiology office on  September 20, 2006. He is a 70 year old gentleman who is status post aortic  valve replacement and mitral valve repair in the setting of severe  aortic insufficiency with a dilated left ventricle and moderate mitral  insufficiency. His preop LVEF was 30%. I saw him just a few weeks ago in  clinic and at that time he was complaining of fatigue and light-  headedness. He was hypotensive and tachycardic and I cut back on his  antihypertensive medications. He is currently feeling much better. He  does not have any complaints at this time. He specifically denies chest  pain, dyspnea, orthopnea, PND, edema or light-headedness.   CURRENT MEDICATIONS:  1. Diovan 320 mg daily.  2. Crestor 10 mg daily.  3. Warfarin as directed.  4. Aspirin 81 mg daily.   ALLERGIES:  No known drug allergies.   PHYSICAL EXAMINATION:  VITAL SIGNS:  Weight is 177 pounds, blood  pressure is 120/80, heart rate is 100.  GENERAL:  He is alert and oriented, no acute distress.  HEENT:  Normal.  NECK:  Normal carotid upstrokes without bruits. Jugular venous pressure  is normal.  LUNGS:  Clear to auscultation bilaterally.  HEART:  Regular rate and rhythm. No murmurs or gallops.  ABDOMEN:  Soft, nontender. No organomegaly.  EXTREMITIES:  No clubbing, cyanosis or edema.   Echocardiogram performed in the office today demonstrated mild to  moderate reduction of LV systolic function with an EF estimated in the  range of 35-45% with mild diffuse left ventricular hypokinesis and  mildly increased wall thickness. The bioprosthetic aortic valve appeared  normal with a transaortic valve gradient of 8 mmHg. The mitral  annuloplasty ring had normal gradients as well.   ASSESSMENT:  Thomas Henderson is currently stable from a cardiac standpoint  following his valve surgery. His heart rate is 100 today which is still  higher than I would like to see it but it is better than at the time of  his last visit. His postoperative echo to reestablish a new baseline  looks good. I have asked him to reduce his Diovan to 160 mg daily so  that we can make room to add in metoprolol ER 25 mg at bedtime. I think  this combination will be beneficial in treating his left ventricular  dysfunction as well as adding a beta blocker to control his heart rate  better. He has Lasix at home but is not regularly taking it. At this  point, I do not see any need to have him on daily diuretic therapy but  he certainly could use it on an as needed basis. I would like to see Mr.  Henderson back in 2 months for followup. He is scheduled to see Dr. Cornelius Moras at  the end  of this month. I suspect his warfarin will be discontinued in  the near future under the care of Dr. Cornelius Moras.     [name]. [name], MD  Electronically Signed    MDC/MedQ  DD: 09/20/2006  DT: 09/20/2006  Job #: [id number]   cc:   Salvatore Decent. Cornelius Moras, M.D.  Lise Auer

## 2010-08-15 NOTE — Assessment & Plan Note (Signed)
[facility]                            CARDIOLOGY OFFICE NOTE   Thomas Henderson, Thomas Henderson                       MRN:          [id number]  DATE:09/02/2006                            DOB:          [birth year]    Thomas Henderson returns for hospital followup after his recent cardiac  surgery. He is a 70 year old African American gentleman who was  initially seen here on April 23rd with severe aortic insufficiency,  moderate mitral insufficiency, and congestive heart failure with reduced  left ventricular ejection fraction. He underwent a TEE as well as a  cardiac catheterization in anticipation of requiring valve surgery. He  had an estimated ejection fraction of 30% with severe eccentric aortic  insufficiency and moderate mitral regurgitation from a dilated annulus.  He had no obstructive coronary artery disease. He underwent valve  surgery on May 13th and had an aortitic valve replacement with a 25 mm  pericardial tissue valve and mitral valve repair with a 26 mm ring  annuloplasty. His postoperative course was relatively uneventful and he  has returned home and is on the road to recovery.   He complains of some fatigue and lightheadedness. He has not had any  chest pain or dyspnea. He complains of some symptoms of gastroesophageal  reflux and has vomited a few times after eating. He has no other  complaints at this point.   CURRENT MEDICATIONS:  Include;  1. Diovan/hydrochlorothiazide 160/12.5 mg daily.  2. Crestor 20 mg daily.  3. Furosemide 40 mg daily.  4. Warfarin as directed.   ALLERGIES:  No known drug allergies.   PHYSICAL EXAMINATION:  The patient is alert and oriented. He is in no  acute distress. Weight is 175 pounds, blood pressure was 94/70 and the  right arm, 84/70 in the left arm, heart rate is 115.  HEENT: Normal.  NECK: Normal carotid upstrokes without bruits. Jugular venous pressure  is normal.  LUNGS: Clear to auscultation  bilaterally.  HEART: Tachycardic and regular without murmurs or gallops.  ABDOMEN: Soft, nontender. No organomegaly.  EXTREMITIES: No clubbing, cyanosis, or edema. Peripheral pulses are 2 +  and equal throughout.   EKG: Shows sinus tachycardia with left ventricular hypertrophy and  associated repolarization changes.   ASSESSMENT:  Thomas Henderson is recovering from aortic valve replacement and  mitral valve repair. He has the following cardiac issues;  1. Sinus tachycardia. I am not sure of the etiology of this. This      could be in response to his low blood pressure and I have elected      to hold all of his antihypertensive medications in the setting of      symptomatic hypotension. He will continue only on Crestor and      warfarin and will not take furosemide or Diovan/hydrochlorothiazide      until he is seen back in follow up. He also had some postoperative      anemia and his hemoglobin at discharge was 9 milligrams per      deciliter. We will recheck a hemoglobin here in the office today.  Finally, I would like to check an echocardiogram to establish his      postoperative baseline. This will be done within the next few      weeks.  2. Symptomatic hypotension. As above, will hold antihypertensive      medications. I suspect his blood pressure will rebound nicely as he      otherwise is feeling fairly well. We will make sure that he does      not have anemia as detailed above.  3. His follow up, we will follow up with Thomas Henderson by telephone after      his lab work is complete. I would like to see him back within 2      weeks to recheck his blood pressure off of medication. He will have      a chest x-ray performed in the office today and will follow up with      Dr. Cornelius Moras within the next week. We will recheck an echocardiogram      for postoperative baseline as well. He was directed to call if he      has any further symptomatic problems. In addition I asked him to      resume  Prilosec which he has at home to try to improve with his      gastroesophageal reflux symptoms.     Thomas Henderson. [name], MD  Electronically Signed    MDC/MedQ  DD: 09/02/2006  DT: 09/02/2006  Job #: [id number]

## 2010-08-15 NOTE — Assessment & Plan Note (Signed)
[facility]                            CARDIOLOGY OFFICE NOTE   CARVEL, HUSKINS                       MRN:          [id number]  DATE:12/24/2007                            DOB:          Oct 21, 1940    REASON FOR VISIT:  Evaluate the patient with nonischemic cardiomyopathy  and valvular heart disease.   HISTORY OF PRESENT ILLNESS:  Mr. Vacha is a 70 year old gentleman who  underwent aortic valve replacement and mitral valve repair by Dr. Cornelius Moras  just over 1 year ago.  He had moderate LV dysfunction with an LVEF of 35-  45% prior to surgery.  His primary valvular lesion was aortic  insufficiency, and he likely had mitral regurgitation from annular  dilatation.  He is doing very well from a symptomatic standpoint.  He  denies chest pain, dyspnea, orthopnea, PND, or edema.  He complains of  generalized fatigue that he attributes to medication.  He also complains  of erectile dysfunction.  He has no other complaints today.   MEDICATIONS:  Hydrochlorothiazide 25 mg daily, lisinopril 40 mg daily,  Crestor 10 mg daily, aspirin 81 mg daily, and metoprolol ER 50 mg twice  daily.   ALLERGIES:  NKDA.   PHYSICAL EXAMINATION:  GENERAL:  On exam, the patient is alert and  oriented.  He is in no acute distress.  VITAL SIGNS:  Weight is 209 pounds, blood pressure 130/90, heart rate  50, respiratory rate 16.  HEENT:  Normal.  NECK:  Normal carotid upstrokes.  No bruits.  JVP normal.  LUNGS:  Clear bilaterally.  HEART:  Regular rate and rhythm.  No murmurs or gallops.  ABDOMEN:  Soft, nontender.  No organomegaly.  EXTREMITIES:  No clubbing, cyanosis, or edema.   EKG shows sinus bradycardia with LVH.  There are no significant ST or T-  wave changes.   ASSESSMENT:  1. Valvular heart disease status post aortic valve replacement with      pericardial tissue valve and mitral valve repair with ring      annuloplasty.  Mr. Kluth had an echocardiogram done today.   It has      not been officially interpreted, but upon my review he appears to      have completely recovered his LV function.  I would estimate his      LVEF at 60%.  I am very pleased with his recovery.  Continue      current medical program with lisinopril and long-acting metoprolol.      I have asked him to reduce his metoprolol dose to 50 mg once daily      and to be taken at bedtime.  This probably is contributing to his      fatigue since he has significant bradycardia.  I would expect his      heart rate to increase with this dose reduction.  2. Erectile dysfunction.  Prescribed Cialis 10 mg as needed.  3. Dyslipidemia.  The patient is on Crestor.  He is followed by Dr.      Francesca Jewett.   For followup, I would like  to see Mr. Murch back in 6 months.     [name]. [name], MD  Electronically Signed    MDC/MedQ  DD: 12/24/2007  DT: 12/25/2007  Job #: [id number]   cc:   Lise Auer

## 2010-08-15 NOTE — H&P (Signed)
NAMEFERD, HORRIGAN                ACCOUNT NO.:  [id number]   MEDICAL RECORD NO.:  [id number]            PATIENT TYPE:   LOCATION:                                 FACILITY:   PHYSICIAN:  Salvatore Decent. Cornelius Moras, M.D. DATE OF BIRTH:  08/13/2004   DATE OF ADMISSION:  DATE OF DISCHARGE:                              HISTORY & PHYSICAL   PRESENTING CHIEF COMPLAINT:  Shortness of breath.   HISTORY OF PRESENT ILLNESS:  Mr. Abee is a 70 year old African-American  male from [place] with longstanding history of a heart murmur as well  as history of hypertension.  The patient states that for the last 1-1/2  to 2 years he has had intermittent symptoms of worsening exertional  shortness of breath and occasional orthopnea and PND.  He had a severe  episode of shortness of breath last October for which he was evaluated  in the emergency room.  It was thought that this was probably related to  seasonal allergies.  His symptoms seemed to improve, and he saw a  variety of specialists, at one time being told he probably had GE reflux  disease.  The symptoms got better but then again eventually got worse.  Eventually he presented to [facility] emergency department on  March 24 where he was noted to be in class IV congestive heart failure.  He was given intravenous Lasix and started on oral Lasix as an  outpatient.  His symptoms dramatically improved.  Since then, he has  continued to do well, although he does report some mild residual  exertional shortness of breath.  He underwent a followup echocardiogram  on March 25 that reportedly showed dilated left ventricle with severe  left ventricular dysfunction, severe mitral regurgitation, and aortic  insufficiency.  He was referred to Dr. Tonny Bollman who saw him in  consultation on April 23.  He was subsequently scheduled for  transesophageal echocardiogram and left and right heart catheterization.  Transesophageal echocardiogram was performed April  25 by Dr. Jens Som.  This was notable for the presence of moderate to severe aortic  insufficiency with moderate mitral regurgitation and moderate to severe  left ventricular dysfunction with dilated left ventricle.  The aortic  root was dilated, measuring 5.0 cm in transverse diameter.  He  subsequently underwent left and right heart catheterization by Dr.  [name] on April 30.  This was notable for the absence of any significant  coronary artery disease.  This confirmed the presence of severe aortic  insufficiency with a dilated aortic root.  He was found to have moderate  global systolic left ventricular dysfunction.  Right heart pressures  were fairly normal with PA pressures measured 38/13 and a pulmonary  capillary wedge pressure of 6.  Cardiac output was normal.  Mr. Plotts  has now been referred to consider elective surgical intervention.   REVIEW OF SYSTEMS:  GENERAL:  The patient reports normal appetite.  He  has not been gaining or losing weight recently.  CARDIAC:  Notable for  stable exertional shortness of breath on medical treatment.  The patient  reported  severe exertional shortness of breath as well as PND and  orthopnea that prompted presentation in late March.  These symptoms have  gotten much better on medical treatment, although he still has some  exertional shortness of breath.  He denies any history of substernal  chest pain or chest tightness either with activity or at rest.  He has  had occasional palpitations, but he denies any dizzy spells or syncopal  episodes.  He denies any lower extremity edema.  RESPIRATORY:  Notable  for a cough that has improved with diuretic treatment.  Initially he was  coughing up some white, frothy sputum at the time of his presentation in  March, but this has improved.  He denies any purulent sputum production  or hemoptysis.  GASTROINTESTINAL:  Negative.  The patient has no  difficulty swallowing.  He denies any CT, hematemesis,  melena.  MUSCULOSKELETAL:  Negative.  The patient denies significant problems  with arthritis or arthralgias other than mild pain in his right  shoulder.  GENITOURINARY:  Negative.  The patient denies urinary urgency  or frequency.  PERIPHERAL VASCULAR:  Negative.  NEUROLOGIC:  Negative.  The patient denies symptoms suggestive of previous TIA or stroke.  HEENT:  Negative.  The patient sees his dentist regularly and reports  that he always is given routine antibiotics prior to any dental cleaning  or work.   PAST MEDICAL HISTORY:  1. Hypertension  2. Congestive heart failure   PAST SURGICAL HISTORY:  1. Lumbar laminectomy and diskectomy x2.  2. Left eye surgery.  3. Left leg surgery.  4. Tonsillectomy   FAMILY HISTORY:  Noncontributory.   SOCIAL HISTORY:  The patient is married and lives with his wife in  [place].  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  He is a nonsmoker, and he  denies alcohol consumption.  He denies any known history of rheumatic  fever.   CURRENT MEDICATIONS:  1. Crestor 20 mg daily  2. Lasix 40 mg daily  3. Diovan/hydrochlorothiazide 160/12.5 one tablet daily.   DRUG ALLERGIES:  None known.   PHYSICAL EXAMINATION:  GENERAL:  The patient is a well-appearing African-  American male who appears his stated age in no acute distress.  VITAL SIGNS:  Blood pressure 140/54, pulse 97 and regular with  occasional ectopic beat. Oxygen saturation 99% on room air.  HEENT: Exam is essentially unrevealing.  NECK:  The neck is supple.  There is no cervical or supraclavicular  lymphadenopathy.  There is no jugular venous distension.  No carotid  bruits are noted.  CHEST:  Auscultation of the chest demonstrates clear breath sounds which  are symmetrical bilaterally.  No wheezes or rhonchi are noted. CARDIOVASCULAR:  Exam includes regular rate and rhythm with frequent  ectopic beats.  There is a  prominent systolic murmur as well as  prominent early diastolic murmur heard best along the sternal border  with radiation towards the axilla into the apex.  ABDOMEN:  Soft, nontender.  The liver edge is not palpable.  There is no  ascites.  Bowel sounds are present.  EXTREMITIES:  Warm and adequately perfused.  There is no lower extremity  edema.  Distal pulses are palpable in both lower legs at the ankle.  SKIN:  The skin is clean, dry, and healthy appearing throughout.  RECTAL AND GU:  Exams are both deferred.   DIAGNOSTIC TESTS:  Transesophageal echocardiogram performed by Dr.  Crenshaw April 25 is reviewed.  This demonstrates dilated left ventricle  with severe left ventricular dysfunction, ejection fraction estimated  30%.  There is severe aortic insufficiency with an eccentric jet of  aortic insufficiency skirting across the ventricular surface of the  mitral valve anterior leaflet.  The aortic root itself appears somewhat  dilated, although the aortic annulus appears fairly normal in dimension.  There is moderate mitral regurgitation that appears to be completely  related to dilated mitral annulus with perhaps some systolic restriction  of the posterior leaflet.  This is a broad central jet of mitral  regurgitation.  The left atrium was enlarged.  No other significant  abnormalities are noted.   Left and right heart catheterization performed April 30 by Dr. [name] is  reviewed.  This is notable for normal coronary arteries with no  significant coronary artery disease.  There is severe aortic  insufficiency and moderate mitral regurgitation.  Pulmonary artery  pressures are as noted previously.   IMPRESSION:  Likely longstanding aortic insufficiency, now with severe  left ventricular dysfunction, dilated left ventricle and secondary  functional mitral regurgitation.  Long-term prognosis without surgical  intervention would be poor.  The risks associated with surgery are not   trivial due to the significant underlying left ventricular dysfunction.  However, clearly Mr. Wierzbicki best chance for long-term survival is with  surgery, and given findings of his recent catheterization, I suspect he  will probably do acceptably well.  We will need CT angiogram of the  thoracic aorta to further evaluate the size of the ascending aorta and  aortic root to see if concomitant aortic root replacement and/or  resection and grafting of the ascending thoracic aorta will be  necessary.   PLAN:  I have discussed options at length with Mr. Haq and his wife  here in the office today.  Alternative treatment strategies have been  discussed.  He understands and accepts all associated risks of surgery  including but not limited to risk of death, stroke, myocardial  infarction, congestive heart failure, respiratory failure, renal failure, pneumonia, bleeding requiring blood transfusion, arrhythmia,  heart block with bradycardia requiring permanent pacemaker.  We have  also discussed alternatives with respect to aortic valve replacement,  and after considerable discussion, Mr. Murdoch specifically requests that  we use a bioprosthetic tissue valve of some type to avoid the need for  long-term anticoagulation with Coumadin.  He understands that there may  be a significant associated risk of late structural valve deterioration  and failure depending upon his longevity.  However, given his age and  degree of left ventricular dysfunction, I support his decision, and the  risks associated with Coumadin if a mechanical valve were utilized are  not trivial.  I suspect that his mitral valve to be repaired, likely  using a simple annuloplasty ring.  All of his questions have been  addressed.  We tentatively plan to proceed with surgery on Tuesday, May  13.  We will obtain CT angiogram of the thoracic aorta later this week.  All of his questions have been addressed.      Salvatore Decent. Cornelius Moras,  M.D.  Electronically Signed     CHO/MEDQ  D:  08/05/2006  T:  08/05/2006  Job:  [id number]   cc:   [name]. [name], MD  Lise Auer

## 2010-08-15 NOTE — Op Note (Signed)
[name], Thomas Henderson                ACCOUNT NO.:  [id number]   MEDICAL RECORD NO.:  [id number]          PATIENT TYPE:  INP   LOCATION:  2311                         FACILITY:  [facility]   PHYSICIAN:  Bedelia Person, M.D.        DATE OF BIRTH:  05/13/1940   DATE OF PROCEDURE:  08/13/2006  DATE OF DISCHARGE:                               OPERATIVE REPORT   PROCEDURE:  Intraoperative transesophageal echocardiography.   Prior to the surgical procedure, the patient was quizzed about possible  esophageal or gastric medical conditions that would preclude the use of  transesophageal probe.  He denied all such conditions.  The patient  induced with general anesthesia.  The area was secured with an  endotracheal tube.  The gastric contents were suctioned with an  orogastric tube which was then removed.  The transesophageal probe was  placed in a sleeve, heavily lubricated and placed down the oropharynx  blindly with essentially no resistance.  The probe remained in the  neutral unflexed position at approximately the 45 cm mark throughout the  case.  It obtained the various Omniplane views.  The prebypass  examination revealed the left ventricle to be thickened with left  ventricular hypertrophy of 1.4 cm size measurement.  There was overall  left ventricular global dysfunction with an estimated ejection fraction  of 40-45%.  There were no segmental defects.  The left atrium was  moderately enlarged.  The appendage was clean.  The septum was intact.  The mitral valve appearance was normal with free motion of the anterior  and posterior leaflets.  Color Doppler revealed a diffuse central  regurgitant jet.  Pulse continuous wave Doppler of the pulmonary veins  revealed no reversal of flow.  The aortic valve had three leaflets that  opened and closed appropriately.  Annular measurement was 2.4 cm.  The  aortic root was slightly more dilated at 4.8 cm.  Thoracic aorta was  measured at 3.2 cm.  The aortic  valve had three leaflets that opened and  closed appropriately.  There was significant aortic insufficiency.  The  insufficient jet road under the anterior leaflet of the mitral valve.  There was no significant stenosis as measured by flow of the aortic  valve.  There was no calcification.  Tricuspid valve was normal in  appearance and function.  Swan-Ganz catheter was across the valve.  There was trace tricuspid insufficiency.  The patient was placed on  cardiopulmonary bypass and underwent aortic valve replacement and mitral  valve repair with ring.  At completion of the bypass period, there were  numerous air bubbles which were quickly cleared with the left  ventricular vent.  The aortic valve showed free three freely movable  leaflets.  They were opening and closing appropriately.  The valve  appeared to be well seated.  There was no perivalvular leaks.  There was  no aortic insufficiency.  The mitral valve was now significantly smaller  in diameter with the ring in place.  Color Doppler revealed on  regurgitant mitral flow.  The left ventricular function was essentially  unchanged from the prebypass period with mild to moderate overall left  ventricular dysfunction.  There were no other significant changes from  the prebypass period.           ______________________________  Bedelia Person, M.D.     LK/MEDQ  D:  08/13/2006  T:  08/13/2006  Job:  [id number]

## 2010-08-15 NOTE — Assessment & Plan Note (Signed)
OFFICE VISIT   Thomas Henderson, Thomas Henderson  DOB:  July 15, 1940                                        November 18, 2006  CHART #:  [id number]   HISTORY OF PRESENT ILLNESS:  The patient returns for followup status  post aortic valve replacement and mitral valve repair on Aug 13, 2006.  He was last seen here in the office on September 30, 2006. Since then, he has  continued to do well. His only issue at this point remains that he has  continued to have to go up on blood pressure medications to try to get  his hypertension under control. He is now taking Diovan 160 mg daily and  Toprol XL 100 mg daily. Otherwise, he has no problems or complaints.   He no longer has any soreness in his chest. He denies any shortness of  breath. He has not had any palpitations or dizzy spells. The remainder  of his review of systems is unrevealing. The remainder of his past  medical history is unchanged.   PHYSICAL EXAMINATION:  Is notable for a well-appearing male with blood  pressure 159/84, pulse 85. Oxygen saturation is 99% on room air.  Examination of the chest reveals a median sternotomy incision that has  healed nicely. The sternum is stable on palpation. Breath sounds are  clear to auscultation and symmetrically bilaterally. No wheezes or  rhonchi are noted. CARDIOVASCULAR: Includes a regular rate and rhythm.  No murmurs, rubs or gallops are noted.  ABDOMEN: Soft and nontender. EXTREMITIES: Are warm and well perfused.  There is no lower extremity edema.   IMPRESSION:  Continued progress now approximately three months following  aortic valve replacement and mitral valve repair. The patient seems to  be doing quite well. His blood pressure is still a little bit on the  high side. He plans to see Dr.  [name] later this week for further  followup.   PLAN:  I have instructed the patient that he can continue to gradually  increase his physical activity as tolerated without any specific  limitations at this point in time. I think he can go back to starting to  lift things and doing things more strenuous as long as he slowly  increases over time. Ultimately, he should have no physical restrictions  from the standpoint of his previous surgery. We will leave any further  adjustment in his medications to control his hypertension to the care  and discretion of Dr.  [name]. In the future, the patient will call or  return to see [place] here only should further problems or difficulties  arise.   Salvatore Decent. Cornelius Moras, M.D.  Electronically Signed   CHO/MEDQ  D:  11/18/2006  T:  11/18/2006  Job:  [id number]   cc:   [name]. [name], MD

## 2010-08-15 NOTE — Assessment & Plan Note (Signed)
[facility]                            CARDIOLOGY OFFICE NOTE   STEPHFON, BOVEY                       MRN:          [id number]  DATE:11/21/2006                            DOB:          September 28, 1940    Cindy Brindisi returns for followup at the [facility] Cardiology office on  November 21, 2006.  Mr. Happ is a very nice, 70 year old gentleman with  aortic and mitral valve insufficiency, who was treated surgically with  an aortic valve replacement and mitral valve repair by Dr. Cornelius Moras on Aug 13, 2006.  He continues to improve postoperatively.  Mr. Teare has been  doing very well recently and has been asymptomatic.  His activity level  has increased.  He is not engaged in regular exercise, but with normal  daily activities, he has no chest pain, dyspnea, orthopnea, PND, light-  headedness, syncope or edema.  He has been monitoring his blood pressure  closely at home and he has had elevated blood pressure readings, ranging  from the 140s up to a high of 170 systolic, and diastolic blood  pressures from the 80s up to 100.  His heart rate has been improved,  ranging from the 60s to 80s.   CURRENT MEDICATIONS INCLUDE:  1. Crestor 10 mg daily.  2. Aspirin 81 mg daily.  3. Hydrochlorothiazide 12.5 mg daily.  4. Metoprolol 100 mg daily.   ALLERGIES:  NKDA.   ON EXAM:  He is alert and oriented, in no acute distress.  Weight is 184 pounds, blood pressure is 140/90, heart rate was 100 on  presentation and on my check was 82.  HEENT:  Normal.  NECK:  Normal carotid upstrokes without bruits.  Jugular venous pressure  is normal.  LUNGS:  Clear to auscultation bilaterally.  HEART:  Regular rate and rhythm with a soft systolic ejection murmur at  the right upper sternal border.  There is a soft S4 gallop, as well, no  diastolic murmurs.  ABDOMEN:  Soft, nontender, no organomegaly.  EXTREMITIES:  No clubbing, cyanosis or edema.  Peripheral pulses are  intact and  equal throughout.   ASSESSMENT:  1. Multivalvular heart disease, status post aortic valve replacement      with a tissue valve and mitral valve repair.  His postoperative      echo showed mild to moderate reduction in LV systolic function with      an LVEF of 35-40%.  His postoperative valve gradients were within      the expected ranges.  Recommend serial followup and SBE      prophylaxis.  I would like to see Mr. Nabor back in three months to      see how he is progressing.  2. Hypertension with suboptimal control.  Currently, he is on a      combination of long-acting Metoprolol and low-dose      hydrochlorothiazide.  He has been on Diovan in the past and I am      going to resume this today at a dose of 160 mg.  He will continue  to monitor his blood pressure closely.  3. Dyslipidemia.  He is on Crestor 10 mg.  I would like to follow up      with lipids and LFTs in three months, at the time of his return      visit.   FOLLOWUP:  As above, I will see Mr. Nieto back in three months.     [name]. [name], MD  Electronically Signed    MDC/MedQ  DD: 11/21/2006  DT: 11/22/2006  Job #: [id number]   cc:   Lise Auer

## 2010-08-15 NOTE — Discharge Summary (Signed)
NAMEGABRIELL, Henderson                ACCOUNT NO.:  [id number]   MEDICAL RECORD NO.:  [id number]          PATIENT TYPE:  INP   LOCATION:  2001                         FACILITY:  [facility]   PHYSICIAN:  Salvatore Decent. Cornelius Moras, M.D. DATE OF BIRTH:  10/07/1940   DATE OF ADMISSION:  08/13/2006  DATE OF DISCHARGE:                               DISCHARGE SUMMARY   PRIMARY DIAGNOSES:  1. Severe aortic insufficiency.  2. Moderate to severe mitral regurgitation.   IN-HOSPITAL DIAGNOSES:  1. Acute blood loss anemia postoperatively.  2. Volume overload postoperatively.   SECONDARY DIAGNOSES:  1. Hypertension.  2. Congestive heart failure.  3. Lumbar laminectomy and diskectomy x2.  4. Status post left eye surgery.  5. Status post left leg surgery.  6. Status post tonsillectomy.   IN-HOSPITAL OPERATIONS AND PROCEDURES:  And also  1. Aortic valve replacement using a 25 mm Edwards Magna pericardial      tissue valve.  2. Mitral valve repair using a 26-mm Edwards physio ring annuloplasty.   HISTORY AND PHYSICAL AND HOSPITAL COURSE:  The patient is a 66-year  [place] male with a history of hypertension, who presents with  progressive symptoms of shortness of breath and congestive heart  failure.  Echocardiogram demonstrates severe aortic insufficiency with  moderate to severe mitral regurgitation.  There is left ventricular  chamber dilatation and moderate to severe left ventricular dysfunction.  Left and right heart catheterization demonstrated no significant  coronary artery disease with normal coronary artery anatomy.  There is  noted to be pulmonary hypertension.  CT angiogram of thoracic aorta  demonstrated mild aneurysmal dilatation of the ascending thoracic aorta  and aortic root but the maximum transverse diameter is less than or  equal to 3.5 cm.  Following all these studies, Dr. Cornelius Moras was consulted.  Dr. Cornelius Moras saw and evaluated the patient.  He discussed with the patient  undergoing  aortic valve replacement as well as mitral valve  repair/replacement and possible placement and grafting of the ascending  aorta.  Dr. Cornelius Moras discussed the risks and benefits of the procedure with  the patient.  The patient acknowledged understanding and agreed to  proceed.  Surgery was scheduled for Aug 13, 2006.   The patient was taken to the operating room on Aug 13, 2006, where he  underwent aortic valve replacement using a 25 mm Edwards Magna  pericardial tissue valve with mitral valve repair using a 26-mm Edwards  physio ring annuloplasty.  The patient tolerated this procedure well and  was transferred to the intensive care unit in stable condition.  Postoperatively the patient was noted to be hemodynamically stable.  The  patient was extubated the evening of surgery.  Following extubation, the  patient was noted to be alert and oriented x4, neuro intact.  The  patient's postoperative course was pretty much unremarkable.  Postop day  #1, the patient's cardiac standpoint was stable.  He was able to be  weaned off all drips.  The patient was noted to be in normal sinus  rhythm.  A postoperative chest x-ray was stable with minimal chest tube  drainage.  Chest tubes and lines were discontinued in the normal  fashion.  The patient was transferred out to 2000 in stable condition.  Postoperatively the patient did develop acute blood loss anemia with  hemoglobin and hematocrit dropping down to 8.4 and 25% postop day #1.  The patient was asymptomatic and this was followed.  By postop day #3 it  had increased to 9.0 and 26.6%.  Also, postoperatively the patient did  develop volume overload.  He was started on diuretics.  The patient's  weight was followed daily and was back near his baseline prior to  discharge home.  The patient's vital signs were also followed closely  postoperatively.  They remained stable.  The patient remained afebrile.  He was able to be weaned off oxygen, saturating  greater than 90% on room  air.  The patient's cardiac status remained stable.  He remained in  normal sinus rhythm.  The patient's incisions were clean, dry and intact  and healing well.  He was out of bed ambulating well postoperatively.  He the patient was tolerating a diet well, no nausea or vomiting noted.   Labs postop day #3 showed a white count of 12.7, hemoglobin of 9.0,  hematocrit 26.6% with platelet count of 165.  Sodium of 136, potassium  of 4.1, chloride of 104, bicarb of 27, BUN of 21, creatinine of 0.95,  glucose of 148, with an INR of 1.3.  The patient was started on Coumadin  secondary to undergoing mitral valve repair.  Daily PT/INRs were  obtained and Coumadin adjusted appropriately.   The patient is tentatively ready for discharge home in the next 1-2  days.   FOLLOW-UP APPOINTMENTS:  A follow-up appointment will be arranged with  Dr. Cornelius Moras for in 3 weeks.  Our office will contact the patient with this  information.  The patient will need to follow up with Dr. Earmon Phoenix  office in 2 weeks.  He will need to contact Dr. Earmon Phoenix office to make  these arrangements.  The patient will need to obtain PT/INR blood work  done 2 days post discharge at [facility].  He will need to  call to arrange this appointment.   ACTIVITY:  Patient instructed no driving until released to do so, no  heavy lifting over 10 pounds.  The patient is told to ambulate 3-4 times  per day, progress as tolerated, and continue his breathing exercises.   DIET:  Patient educated on a diet to be low-fat, low-salt.   INCISIONAL CARE:  The patient was told he is allowed to shower, washing  his incisions using soap and water.  He is to contact the office if he  develops any drainage or opening from any of his incision sites.   DISCHARGE MEDICATIONS:  1. Aspirin 81 mg daily.  2. Lopressor 25 mg b.i.d.  3. Crestor 10 mg at night.  4. Diovan 160 mg daily. 5. Lasix 40 mg daily x5 days.  6.  Potassium chloride 20 mEq daily x5 days.  7. Coumadin will be dosed based on the patient's discharge PT/INR      level.  8. Oxycodone 5 mg 1-2 tablets q.4-6h. p.r.n. pain.      Theda Belfast, PA      Salvatore Decent. Cornelius Moras, M.D.  Electronically Signed    KMD/MEDQ  D:  08/16/2006  T:  08/16/2006  Job:  [id number]   cc:   [name]. [name], MD

## 2010-08-18 NOTE — Assessment & Plan Note (Signed)
[facility]                             PULMONARY OFFICE NOTE   WALLIS, VANCOTT                       MRN:          [id number]  DATE:06/20/2006                            DOB:          05/11/1940    REFERRING PHYSICIAN:  Lise Auer   PULMONARY CONSULTATION   I saw Mr. Kenley today for evaluation of his cough.   He says this has been going on for the last 1 year.  He says it comes  and goes in spurts.  He also has been feeling like he is getting short  of breath with exertion, although it does not appear that his dyspnea is  actually causing him any significant limitation in his activity.  He  said that he feels like he gets a globus sensation in his throat and he  has to keep clearing his throat.  He notices that his symptoms are worse  at night when he is lying flat.  He only feels like he has something  draining down the back of his throat.  When he wakes up, he has to cough  it up.  He says it is usually clear, although it has occasionally been  yellowish in color.  He denies having any hemoptysis.  He denies having  any fever, chills, or sweats.  He has no recent sick exposures.  He did  go to [facility] approximately a month ago, but his symptoms predated  this.  He was on lisinopril previously and had this discontinued, but  there was no change in his symptoms with cough.  He said he has lost  about 10 pounds recently.  He says he gets an occasional wheeze, but  this is more coming from his throat rather than his lungs.  He denies  any chest pain or chest tightness.  He denies any symptoms of reflux.  He has been on several medications for reflux without significant change  in his cough symptoms.  He had also been tried on Asmanex and Proventil  without significant benefit for his cough.  He did try using Mucinex,  which he said helped to some degree.  Per the patient, he has been  evaluated by an allergist and the otolaryngologist in  [place].  He  thinks he may have had a chest x-ray and a breathing test done with  them, but cannot remember for sure, and he does not recall what their  eventual impression was.   PAST MEDICAL HISTORY:  Otherwise, significant for hypertension and  enlarged heart.  He has had back surgery x2, tonsillectomy, and a left  ocular implant.   He has no known drug allergies.   CURRENT MEDICATIONS:  1. Diovan hydrochlorothiazide 160/12.5 once daily.  2. Omeprazole 20 mg b.i.d.  3. Zantac 300 mg nightly.  4. Crestor 20 mg daily  5. Pulmicort 2 puffs b.i.d., which he has not used for the last      several weeks.  6. Proventil inhaler q.i.d. p.r.n., which he uses on a daily basis,      but does not really notice  any difference with his symptoms.   SOCIAL HISTORY:  He is married.  He has 3 children.  He is a retired  Curator.  He also used to work in [employer].  There is  no history of tobacco or alcohol use.   FAMILY HISTORY:  Significant for his mother who had heart problems.  Father had a brain aneurysm.  Sister had pancreatic cancer.   REVIEW OF SYSTEMS:  Essentially negative, except as stated above.   PHYSICAL EXAM:  He is 5 feet 8 inches tall, 189 pounds, temperature  97.9, blood pressure 160/86, heart rate is 96.  Oxygen saturation 99% on  room air.  HEENT:  Pupils are reactive.  Extraocular muscles are intact.  He has no  sinus tenderness.  He has a clear nasal discharge.  He has mild  cobblestoning of the posterior pharynx.  Otherwise, no oral lesions.  No  lymphadenopathy.  No thyromegaly.  HEART:  S1, S2.  CHEST:  No wheezing or rales.  ABDOMEN:  Soft, nontender.  Positive bowel sounds.  EXTREMITIES:  There was no edema, cyanosis, or clubbing.  NEUROLOGIC:  He is alert and oriented x3 with 5/5 strength.  No  cerebellar deficits were appreciated.   IMPRESSION:  Chronic cough.  I will attempt to obtain his old records  with the evaluations from his allergist  and otolaryngologist.  In the  meantime, I would have him repeat his chest x-ray and pulmonary function  test.  The primary concern that I have is that he may in fact have some  degree of post-nasal drip, which is exacerbating his cough symptoms.  I  have given him a sample and instructed him on the use of Nasonex, which  he is to use 2 sprays each nostril once daily.  I have also instructed  him on the use of nasal irrigation.  I have advised him that he can  discontinue the use of his Pulmicort for now.  He can continue to use  his albuterol on as needed basis, but certainly does not need to use  this on a regular basis.  I would also have him continue his reflux  medicines for now.  I then plan on following up with him in  approximately 2 to 3 weeks to assess his symptom response to his nasal  regimen and also to determine if any further interventions would be  necessary.     Coralyn Helling, MD  Electronically Signed    VS/MedQ  DD: 06/20/2006  DT: 06/20/2006  Job #: [id number]   cc:   Lise Auer
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
## 2011-05-31 ENCOUNTER — Encounter: Payer: Self-pay | Admitting: Cardiovascular Disease

## 2011-06-18 ENCOUNTER — Encounter: Payer: Self-pay | Admitting: Cardiovascular Disease

## 2011-07-17 ENCOUNTER — Ambulatory Visit: Payer: Medicare Other | Admitting: Cardiovascular Disease

## 2011-07-25 ENCOUNTER — Encounter: Payer: Self-pay | Admitting: Nurse Practitioner

## 2011-07-25 ENCOUNTER — Ambulatory Visit (INDEPENDENT_AMBULATORY_CARE_PROVIDER_SITE_OTHER): Payer: Medicare Other | Admitting: Nurse Practitioner

## 2011-07-25 VITALS — BP 108/70 | HR 74 | Ht 68.0 in | Wt 202.8 lb

## 2011-07-25 DIAGNOSIS — I340 Nonrheumatic mitral (valve) insufficiency: Secondary | ICD-10-CM

## 2011-07-25 DIAGNOSIS — I059 Rheumatic mitral valve disease, unspecified: Secondary | ICD-10-CM

## 2011-07-25 DIAGNOSIS — I77810 Thoracic aortic ectasia: Secondary | ICD-10-CM

## 2011-07-25 DIAGNOSIS — I77819 Aortic ectasia, unspecified site: Secondary | ICD-10-CM

## 2011-07-25 DIAGNOSIS — I351 Nonrheumatic aortic (valve) insufficiency: Secondary | ICD-10-CM

## 2011-07-25 DIAGNOSIS — I358 Other nonrheumatic aortic valve disorders: Secondary | ICD-10-CM

## 2011-07-25 DIAGNOSIS — I359 Nonrheumatic aortic valve disorder, unspecified: Secondary | ICD-10-CM

## 2011-07-25 NOTE — Progress Notes (Signed)
Patient Name: Thomas Henderson Date of Encounter: 07/25/2011  Primary Care Provider:  Jerry Caras, RN, RN Level II Primary Cardiologist:  Thomas Petit. [name], MD  Patient Profile  71 y/o male s/p AVR/MVR in 2008 who presents for annual f/u.  Problem List   Past Medical History  Diagnosis Date  . Mitral regurgitation     a. 2008 - MVR w/ 26mm annuloplasty ring;  b. 12/2007 Echo Triv MR  . Aortic insufficiency     a. 2008 - AVR w/ 25mm pericardial tissue valve;  a. 12/2007 Echo No AI.  Marland Kitchen HTN (hypertension)   . CHF (congestive heart failure)     a. h/o severe LV dysfxn in setting of valvular dzs in 2008;  b. 12/2007 Echo: EF 55-60%, No RWMA, No AI, Mod Ao Root Dil, Triv MR/PR/TR, NL RV.  Marland Kitchen Dyslipidemia   . ED (erectile dysfunction)   . Aortic root dilatation     a. 12/2007 Echo: Ao Root 48mm   Past Surgical History  Procedure Date  . Leg surgery     left  . Eye surgery     left  . Tonsillectomy   . Lumbar laminectomy   . Lumbar disc surgery   . Aortic valve replacement   . Cardiac catheterization 07/31/06    Allergies  No Known Allergies  HPI  71 y/o male with the above problem list.  Over the past year, he has done well.  He tries to remain active around the house and in the yard and has had no problems with c/p, sob, pnd, orthopnea, n, v, dizziness, syncope, or edema.  His wt has remained pretty stable (205 last year, 202 today).  Home Medications  Prior to Admission medications   Medication Sig Start Date End Date Taking? Authorizing Provider  aspirin 81 MG tablet Take 81 mg by mouth daily.     Yes Historical Provider, MD  hydrochlorothiazide 25 MG tablet Take 25 mg by mouth daily.     Yes Historical Provider, MD  lisinopril (PRINIVIL,ZESTRIL) 40 MG tablet Take 40 mg by mouth daily.     Yes Historical Provider, MD  metoprolol (TOPROL-XL) 50 MG 24 hr tablet TAKE ONE-HALF TABLET BY MOUTH EVERY DAY 07/17/10  Yes Thomas Bollman, MD  niacin 500 MG tablet Take 500 mg by  mouth daily with breakfast.   Yes Historical Provider, MD  pantoprazole (PROTONIX) 40 MG tablet Take 40 mg by mouth daily.   Yes Historical Provider, MD  predniSONE (DELTASONE) 5 MG tablet Take 5 mg by mouth daily.   Yes Historical Provider, MD  simvastatin (ZOCOR) 20 MG tablet Take 20 mg by mouth at bedtime.     Yes Historical Provider, MD   Review of Systems No chest pain, sob, n, v, dizziness, syncope, edema, early satiety, dysuria, dark stools, blood in stools, diarrhea, rash/skin changes, fevers, chills, wt loss/gain.  Otherwise all systems reviewed and negative.   Physical Exam  Blood pressure 108/70, pulse 74, height 5\' 8"  (1.727 m), weight 202 lb 12.8 oz (91.989 kg).  General: Pleasant, NAD Psych: Normal affect. Neuro: Alert and oriented X 3. Moves all extremities spontaneously. HEENT: Normal  Neck: Supple without bruits or JVD. Lungs:  Resp regular and unlabored, CTA. Heart: RRR no s3, s4.  2/6 SEM @ RUSB. Abdomen: Soft, non-tender, non-distended, BS + x 4.  Extremities: No clubbing, cyanosis or edema. DP/PT/Radials 2+ and equal bilaterally.  Onychomycosis noted.  Accessory Clinical Findings  ECG - sinus arrhythmia, 74,  w/o acute st/t changes.  Assessment & Plan  1.  AI/MR:  S/p bioprosthetic AVR and MVR (ring).  Doing well as above.  He does have a soft systolic murmur - will repeat echo (it's been 3 1/2 yrs) to f/u valves and Ao Root dilatation (48mm in 12/2007).  2.  Ao Root Dilatation: f/u echo as above.  3.  HTN:  Stable.  4.  HL:  On statin.  Followed by pcp.  5.  Arthritis:  Undergoing Rheum eval.  Currently on prednisone.  6.  Dispo:  F/u echo.  F/U Dr. Excell Henderson in 1 yr or sooner if necessary.   Thomas Ducking, NP 07/25/2011, 3:28 PM

## 2011-07-25 NOTE — Patient Instructions (Signed)
Your physician wants you to follow-up in: 1 year with Dr [name].  You will receive a reminder letter in the mail two months in advance. If you don't receive a letter, please call our office to schedule the follow-up appointment.  Your physician has requested that you have an echocardiogram. Echocardiography is a painless test that uses sound waves to create images of your heart. It provides your doctor with information about the size and shape of your heart and how well your heart's chambers and valves are working. This procedure takes approximately one hour. There are no restrictions for this procedure.

## 2011-08-07 ENCOUNTER — Ambulatory Visit (HOSPITAL_COMMUNITY): Payer: Medicare Other | Attending: Cardiology

## 2011-08-07 ENCOUNTER — Other Ambulatory Visit: Payer: Self-pay

## 2011-08-07 DIAGNOSIS — E785 Hyperlipidemia, unspecified: Secondary | ICD-10-CM | POA: Insufficient documentation

## 2011-08-07 DIAGNOSIS — I10 Essential (primary) hypertension: Secondary | ICD-10-CM | POA: Insufficient documentation

## 2011-08-07 DIAGNOSIS — I358 Other nonrheumatic aortic valve disorders: Secondary | ICD-10-CM

## 2011-08-07 DIAGNOSIS — I079 Rheumatic tricuspid valve disease, unspecified: Secondary | ICD-10-CM | POA: Insufficient documentation

## 2011-08-07 DIAGNOSIS — I359 Nonrheumatic aortic valve disorder, unspecified: Secondary | ICD-10-CM | POA: Insufficient documentation

## 2011-08-07 DIAGNOSIS — R011 Cardiac murmur, unspecified: Secondary | ICD-10-CM | POA: Insufficient documentation

## 2011-08-07 DIAGNOSIS — I059 Rheumatic mitral valve disease, unspecified: Secondary | ICD-10-CM | POA: Insufficient documentation

## 2011-08-07 DIAGNOSIS — I509 Heart failure, unspecified: Secondary | ICD-10-CM | POA: Insufficient documentation

## 2011-08-10 ENCOUNTER — Encounter: Payer: Self-pay | Admitting: *Deleted

## 2011-08-10 ENCOUNTER — Other Ambulatory Visit: Payer: Self-pay

## 2011-08-10 DIAGNOSIS — I77810 Thoracic aortic ectasia: Secondary | ICD-10-CM

## 2011-08-10 NOTE — Progress Notes (Signed)
This encounter was created in error - please disregard.

## 2011-08-13 ENCOUNTER — Telehealth: Payer: Self-pay | Admitting: *Deleted

## 2011-08-13 NOTE — Telephone Encounter (Signed)
Left message for patient to call me to have labs drawn and to schedule MRI of CHEST per Dr. [name].

## 2011-08-15 ENCOUNTER — Encounter: Payer: Self-pay | Admitting: *Deleted

## 2011-08-17 ENCOUNTER — Encounter: Payer: Self-pay | Admitting: Cardiovascular Disease

## 2011-08-23 ENCOUNTER — Ambulatory Visit (HOSPITAL_COMMUNITY)
Admission: RE | Admit: 2011-08-23 | Discharge: 2011-08-23 | Disposition: A | Discharge status: Home / Self Care | Payer: Medicare Other | Source: Ambulatory Visit | Attending: Cardiovascular Disease | Admitting: Cardiovascular Disease

## 2011-08-23 ENCOUNTER — Other Ambulatory Visit (HOSPITAL_COMMUNITY): Payer: Medicare Other

## 2011-08-23 DIAGNOSIS — I77810 Thoracic aortic ectasia: Secondary | ICD-10-CM

## 2011-08-23 DIAGNOSIS — I77819 Aortic ectasia, unspecified site: Secondary | ICD-10-CM | POA: Insufficient documentation

## 2011-08-23 MED ORDER — GADOBENATE DIMEGLUMINE 529 MG/ML IV SOLN
20.0000 mL | Freq: Once | INTRAVENOUS | Status: AC | PRN
  Administered 2011-08-23: 19 mL via INTRAVENOUS

## 2011-09-03 NOTE — Progress Notes (Signed)
Patient ID: Thomas Henderson, male   DOB: October 03, 1940, 71 y.o.   MRN: [id number]  The patient's MRA was reviewed. His ascending aorta is now 6 cm. I spoke with the patient on the telephone and he will followup with Dr. Cornelius Moras. We will make a formal referral. Dr. Cornelius Moras has preliminarily reviewed the patient's imaging studies.  Tonny Bollman 09/03/2011 4:51 PM

## 2011-09-04 NOTE — Progress Notes (Signed)
Addended by: Iona Coach on: 09/04/2011 09:27 AM   Modules accepted: Orders

## 2011-09-07 ENCOUNTER — Encounter: Payer: Self-pay | Admitting: Thoracic Surgery (Cardiothoracic Vascular Surgery)

## 2011-09-07 ENCOUNTER — Institutional Professional Consult (permissible substitution) (INDEPENDENT_AMBULATORY_CARE_PROVIDER_SITE_OTHER): Payer: Medicare Other | Admitting: Thoracic Surgery (Cardiothoracic Vascular Surgery)

## 2011-09-07 VITALS — BP 132/86 | HR 69 | Resp 18 | Ht 67.0 in | Wt 205.0 lb

## 2011-09-07 DIAGNOSIS — Z9889 Other specified postprocedural states: Secondary | ICD-10-CM

## 2011-09-07 DIAGNOSIS — I7121 Aneurysm of the ascending aorta, without rupture: Secondary | ICD-10-CM

## 2011-09-07 DIAGNOSIS — Z954 Presence of other heart-valve replacement: Secondary | ICD-10-CM

## 2011-09-07 DIAGNOSIS — Q2544 Congenital dilation of aorta: Secondary | ICD-10-CM | POA: Insufficient documentation

## 2011-09-07 DIAGNOSIS — I77810 Thoracic aortic ectasia: Secondary | ICD-10-CM

## 2011-09-07 DIAGNOSIS — Z953 Presence of xenogenic heart valve: Secondary | ICD-10-CM

## 2011-09-07 DIAGNOSIS — Q2549 Other congenital malformations of aorta: Secondary | ICD-10-CM | POA: Insufficient documentation

## 2011-09-07 DIAGNOSIS — I712 Thoracic aortic aneurysm, without rupture: Secondary | ICD-10-CM

## 2011-09-07 HISTORY — DX: Aneurysm of the ascending aorta, without rupture: I71.21

## 2011-09-07 HISTORY — DX: Thoracic aortic aneurysm, without rupture: I71.2

## 2011-09-07 NOTE — Progress Notes (Signed)
[address]            [name] [address]          [phone]     CARDIOTHORACIC SURGERY CONSULTATION REPORT  Referring Provider is Tonny Bollman, MD PCP is Irena Reichmann, DO, DO  Chief Complaint  Patient presents with  . Thoracic Aortic Aneurysm    Referral from Dr [name] for eval on dilatation of the aortic root, MRA Chest 08/23/11     HPI:  Patient is a 71 year old African American male from [place] underwent aortic valve replacement and mitral valve repair in May of 2008 for severe aortic regurgitation with moderate to severe mitral regurgitation associated with moderate left ventricular dysfunction and congestive heart failure. At the time he was noted to have only mild aneurysmal dilatation of the ascending thoracic aorta and aortic root. Postoperatively the patient did quite well. He has been followed since then on a regular basis by Dr. [name]. Recent followup echocardiogram suggested interval increase size of his aortic root. This prompted MRA of the thoracic aorta which also reveals findings suggestive of the interval increase size of this ascending thoracic aorta and aortic root. The patient has been referred for cardiothoracic surgical consultation.  Clinically the patient has been doing quite well from a cardiovascular standpoint. He denies any exertional shortness of breath. He has not had any chest pain either with activity or at rest. He has struggled with long-standing hypertension, but he claims his blood pressure has been under fairly good control recently.  Past Medical History  Diagnosis Date  . Mitral regurgitation     a. 2008 - MVR w/ 26mm annuloplasty ring;  b. 12/2007 Echo Triv MR  . Aortic insufficiency     a. 2008 - AVR w/ 25mm pericardial tissue valve;  a. 12/2007 Echo No AI.  Marland Kitchen HTN (hypertension)   . CHF (congestive heart failure)     a. h/o severe LV dysfxn in setting of valvular dzs in 2008;  b. 12/2007 Echo: EF 55-60%,  No RWMA, No AI, Mod Ao Root Dil, Triv MR/PR/TR, NL RV.  Marland Kitchen Dyslipidemia   . ED (erectile dysfunction)   . Aortic root dilatation     a. 12/2007 Echo: Ao Root 48mm  . S/P aortic valve replacement with bioprosthetic valve 08/13/2006    #9mm [facility] pericardial tissue valve  . S/P mitral valve repair 08/13/2006    #47mm [facility] ring annuloplasty  . Thoracic ascending aortic aneurysm 09/07/2011    Past Surgical History  Procedure Date  . Leg surgery     left  . Eye surgery     left  . Tonsillectomy   . Lumbar laminectomy   . Lumbar disc surgery   . Aortic valve replacement 08/13/2006    #32mm [facility] pericardial tissue valve  . Cardiac catheterization 07/31/06  . Mitral valve repair 08/13/2006    #58mm [facility] ring annuloplasty    Family History  Problem Relation Age of Onset  . Cardiomyopathy    . Heart attack Brother 65  . Other Mother 48    Enlarged heart    Social History History  Substance Use Topics  . Smoking status: Never Smoker   . Smokeless tobacco: Not on file  . Alcohol Use: No    Current Outpatient Prescriptions  Medication Sig Dispense Refill  . aspirin 81 MG tablet Take 81 mg  by mouth daily.        . hydrochlorothiazide 25 MG tablet Take 25 mg by mouth daily.        Marland Kitchen lisinopril (PRINIVIL,ZESTRIL) 40 MG tablet Take 40 mg by mouth daily.        . metoprolol (TOPROL-XL) 50 MG 24 hr tablet TAKE ONE-HALF TABLET BY MOUTH EVERY DAY  30 tablet  5  . niacin 500 MG tablet Take 500 mg by mouth daily with breakfast.      . pantoprazole (PROTONIX) 40 MG tablet Take 40 mg by mouth daily.      . simvastatin (ZOCOR) 20 MG tablet Take 20 mg by mouth at bedtime.          No Known Allergies  Review of Systems:  General:  normal appetite, normal energy   Respiratory:  no cough, no wheezing, no hemoptysis, no pain with inspiration or cough, no shortness of breath  Cardiac:   no chest pain or tightness, no exertional SOB, no resting SOB, no PND, no  orthopnea, no LE edema, no palpitations, no syncope  GI:   no difficulty swallowing, no hematochezia, no hematemesis, no melena, no constipation, no diarrhea   GU:   no dysuria, no urgency, no frequency   Musculoskeletal: + arthritis and arthralgia involving primarily lower back and fingers of both hands  Vascular:  no pain suggestive of claudication   Neuro:   no symptoms suggestive of TIA's, no seizures, no headaches, no peripheral neuropathy   Endocrine:  Negative   HEENT:  no loose teeth or painful teeth,  no recent vision changes  Psych:   no anxiety, no depression    Physical Exam:   BP 132/86  Pulse 69  Resp 18  Ht 5\' 7"  (1.702 m)  Wt 205 lb (92.987 kg)  BMI 32.11 kg/m2  SpO2 98%  General:    well-appearing  HEENT:  Unremarkable   Neck:   no JVD, no bruits, no adenopathy   Chest:   clear to auscultation, symmetrical breath sounds, no wheezes, no rhonchi   CV:   RRR, soft grade I-I/VI systolic murmur best at LLSB  Abdomen:  soft, non-tender, no masses   Extremities:  warm, well-perfused, pulses palpable, no LE edema  Rectal/GU  Deferred  Neuro:   Grossly non-focal and symmetrical throughout  Skin:   Clean and dry, no rashes, no breakdown   Diagnostic Tests:  Transthoracic Echocardiography  Patient:    Thomas Henderson, [place] MR #:       [id number] Study Date: 08/07/2011 ------------------------------------------------------------ LV EF: 65% ------------------------------------------------------------ ------------------------------------------------------------ Study Conclusions  - Left ventricle: The cavity size was normal. Wall thickness   was increased in a pattern of mild LVH. The estimated   ejection fraction was 65%. Wall motion was normal; there   were no regional wall motion abnormalities. Findings   consistent with left ventricular diastolic dysfunction.   Doppler parameters are consistent with high ventricular   filling pressure. - Aortic valve: The aortic  prosthesis is working well. Mean   gradient: 13mm Hg (S). Peak gradient: 29mm Hg (S). - Aorta: The sino-tobular junction and ascending aorta   measurements are 52mm. - Mitral valve: There was no evidence for stenosis. No   significant regurgitation. Mean gradient: 4mm Hg (D). Peak   gradient: 12mm Hg (D). Valve area by pressure half-time:   2.16cm^2. - Left atrium: The atrium was mildly to moderately dilated. - Right ventricle: The cavity size was mildly to moderately   dilated.  Systolic function was mildly reduced. - Right atrium: The atrium was mildly dilated. ------------------------------------------------------------ ------------------------------------------------------------ ----------------------------------------------------------- Left ventricle:  The cavity size was normal. Wall thickness was increased in a pattern of mild LVH. The estimated ejection fraction was 65%. Wall motion was normal; there were no regional wall motion abnormalities. Findings consistent with left ventricular diastolic dysfunction. Doppler parameters are consistent with high ventricular filling pressure. ------------------------------------------------------------ Aortic valve:  The aortic prosthesis is working well. Doppler:   No significant regurgitation.    VTI ratio of LVOT to aortic valve: 0.41. Peak velocity ratio of LVOT to aortic valve: 0.44.    Mean gradient: 13mm Hg (S). Peak gradient: 29mm Hg (S). ------------------------------------------------------------ Aorta:  The sino-tobular junction and ascending aorta measurements are 52mm. ------------------------------------------------------------ Mitral valve:   Doppler:   There was no evidence for stenosis.    No significant regurgitation.    Valve area by pressure half-time: 2.16cm^2. Indexed valve area by pressure half-time: 1.05cm^2/m^2.    Mean gradient: 4mm Hg (D). Peak gradient: 12mm Hg  (D). ------------------------------------------------------------ Left atrium:  The atrium was mildly to moderately dilated. ------------------------------------------------------------ Right ventricle:  The cavity size was mildly to moderately dilated. Systolic function was mildly reduced. ------------------------------------------------------------ Pulmonic valve:    The valve appears to be grossly normal.  Doppler:   No significant regurgitation. ------------------------------------------------------------ Tricuspid valve:   The valve appears to be grossly normal.  Doppler:   Mild regurgitation. ------------------------------------------------------------ Right atrium:  The atrium was mildly dilated. ------------------------------------------------------------ Pericardium:  There was no pericardial effusion. ------------------------------------------------------------ Post procedure conclusions Ascending Aorta:  - The sino-tobular junction and ascending aorta measurements   are 52mm.  ------------------------------------------------------------ Prepared and Electronically Authenticated by  Willa Rough, MD, [facility] 2013-05-07T18:15:05.277     MRA CHEST WITH OR WITHOUT CONTRAST  (08/23/2011)   Contrast: 19mL MULTIHANCE GADOBENATE DIMEGLUMINE 529 MG/ML IV SOLN   Comparison: CTA of chest dated 08/12/2006   Findings: There is significant interval dilatation of the proximal thoracic aorta and beginning just above the valve plane and reaching maximal caliber of 6 cm.  Dilatation does involve the sinuses of Valsalva.  Just beyond the sinotubular junction, the aorta measures approximately 5 cm in diameter.  Proximal arch measures 3.5 cm.  Descending thoracic aorta measures 2.7 cm.   There is no evidence of aortic dissection or intramural hemorrhage. Proximal great vessels show no significant occlusive disease. There is bovine anatomy present.  The innominate artery and  left common carotid artery are significantly tortuous.  No mediastinal or pericardial fluid collections are identified.  Gross appearance of the cardiac chambers is unremarkable. No pleural effusions are identified.  No incidental masses or enlarged lymph nodes.   Postcontrast MRA sequences visualize much of the abdominal aorta which shows normal caliber and no evidence of aneurysmal disease. Visualized origin of the celiac axis demonstrates approximately 50% stenosis.  The superior mesenteric artery trunk is normally patent. Single renal arteries are demonstrated and show normal patency.   IMPRESSION: Significant interval dilatation of the aortic root which now measures 6 cm in greatest diameter.  The dilatation involves the  sinuses of Valsalva and sinotubular junction.  The aortic arch and descending thoracic aorta show no evidence of aneurysmal disease.   Original Report Authenticated By: Reola Calkins, M.D.   Impression:  The patient has moderate aneurysmal enlargement of the aortic root and ascending thoracic aorta with history of previous aortic valve replacement and mitral valve repair for severe aortic insufficiency and mitral regurgitation. The patient's original aortic valve pathology  was consistent with type II dysfunction related to prolapse of the right cusp of aortic valve.  The patient's valve was tricuspid, and he did not have annuloaortic ectasia.  He was noted to have only mild dilatation of the sinuses of Valsalva at the time of his original surgery.  Followup echocardiogram and MRA do suggest interval increase size of the aortic root and proximal ascending thoracic aorta. However, there was considerable motion artifact on the MRA and I believe the maximum diameter is probably between 5.0 and 5.2 cm, which is consistent with findings of the echocardiogram. In my opinion this is not large enough to warrant surgical intervention at this time, but the fact that there has  been interval increase size since 2009 is certainly important and mandates close followup.   Plan:  I've discussed these findings at length with the patient here in the office today. We discussed the importance of continued close followup for long-term attention to management of hypertension and we discussed the attendant risk of acute aortic dissection. We will plan to have him return in 6 months for followup CT angiogram. I favor CT angiogram rather than MRA for imaging the aortic root do to concerns related to motion artifact and the difficulty ascertaining accurate measurements of the aortic root. It might be best to perform the CT angiogram using a cardiac gated study to eliminate all associated motion artifact. All of his questions been addressed.    Salvatore Decent. Cornelius Moras, MD 09/07/2011 1:51 PM

## 2011-09-07 NOTE — Patient Instructions (Signed)
A very close attention to management of high blood pressure and contact her primary care physician and/or cardiologist if your blood pressure is consistently remaining greater than 140 mm mercury systolic

## 2011-09-10 ENCOUNTER — Encounter: Payer: Medicare Other | Admitting: Thoracic Surgery (Cardiothoracic Vascular Surgery)

## 2011-10-24 ENCOUNTER — Other Ambulatory Visit: Payer: Self-pay | Admitting: Cardiovascular Disease

## 2012-02-18 ENCOUNTER — Other Ambulatory Visit: Payer: Self-pay | Admitting: Thoracic Surgery (Cardiothoracic Vascular Surgery)

## 2012-02-18 DIAGNOSIS — I712 Thoracic aortic aneurysm, without rupture: Secondary | ICD-10-CM

## 2012-02-25 ENCOUNTER — Other Ambulatory Visit: Payer: Self-pay | Admitting: Thoracic Surgery (Cardiothoracic Vascular Surgery)

## 2012-02-29 LAB — CREATININE, SERUM: Creat: 1.26 mg/dL (ref 0.50–1.35)

## 2012-02-29 LAB — BUN: BUN: 23 mg/dL (ref 6–23)

## 2012-03-10 ENCOUNTER — Ambulatory Visit (HOSPITAL_COMMUNITY)
Admission: RE | Admit: 2012-03-10 | Discharge: 2012-03-10 | Disposition: A | Discharge status: Home / Self Care | Payer: Medicare Other | Source: Ambulatory Visit | Attending: Thoracic Surgery (Cardiothoracic Vascular Surgery) | Admitting: Thoracic Surgery (Cardiothoracic Vascular Surgery)

## 2012-03-10 ENCOUNTER — Encounter (HOSPITAL_COMMUNITY): Payer: Self-pay

## 2012-03-10 DIAGNOSIS — I712 Thoracic aortic aneurysm, without rupture, unspecified: Secondary | ICD-10-CM | POA: Insufficient documentation

## 2012-03-10 DIAGNOSIS — I2510 Atherosclerotic heart disease of native coronary artery without angina pectoris: Secondary | ICD-10-CM | POA: Insufficient documentation

## 2012-03-10 MED ORDER — NITROGLYCERIN 0.4 MG SL SUBL
0.4000 mg | SUBLINGUAL_TABLET | Freq: Once | SUBLINGUAL | Status: AC
  Administered 2012-03-10: 0.4 mg via SUBLINGUAL

## 2012-03-10 MED ORDER — NITROGLYCERIN 0.4 MG SL SUBL
SUBLINGUAL_TABLET | SUBLINGUAL | Status: AC
  Filled 2012-03-10: qty 25

## 2012-03-10 MED ORDER — IOHEXOL 350 MG/ML SOLN
99.0000 mL | Freq: Once | INTRAVENOUS | Status: AC | PRN
  Administered 2012-03-10: 99 mL via INTRAVENOUS

## 2012-03-10 MED ORDER — METOPROLOL TARTRATE 1 MG/ML IV SOLN
5.0000 mg | Freq: Once | INTRAVENOUS | Status: AC
  Administered 2012-03-10: 5 mg via INTRAVENOUS

## 2012-03-10 MED ORDER — METOPROLOL TARTRATE 1 MG/ML IV SOLN
INTRAVENOUS | Status: AC
  Filled 2012-03-10: qty 5

## 2012-03-10 NOTE — Progress Notes (Signed)
Discharged walking to drive self home. States feels fine

## 2012-03-17 ENCOUNTER — Ambulatory Visit (INDEPENDENT_AMBULATORY_CARE_PROVIDER_SITE_OTHER): Payer: Medicare Other | Admitting: Thoracic Surgery (Cardiothoracic Vascular Surgery)

## 2012-03-17 ENCOUNTER — Encounter: Payer: Self-pay | Admitting: Thoracic Surgery (Cardiothoracic Vascular Surgery)

## 2012-03-17 VITALS — BP 134/88 | HR 80 | Resp 16 | Ht 67.0 in | Wt 205.0 lb

## 2012-03-17 DIAGNOSIS — I77810 Thoracic aortic ectasia: Secondary | ICD-10-CM

## 2012-03-17 DIAGNOSIS — I712 Thoracic aortic aneurysm, without rupture: Secondary | ICD-10-CM

## 2012-03-17 NOTE — Patient Instructions (Signed)
Avoid heavy lifting or straining.  Keep an eye on your blood pressure and keep it under good control.  Call or go directly to ER for any persistent pain in chest or upper back.

## 2012-03-17 NOTE — Progress Notes (Signed)
[address]            [name] [address]          [phone]     CARDIOTHORACIC SURGERY OFFICE NOTE  Referring Provider is Tonny Bollman, MD PCP is Irena Reichmann, DO   HPI:  Patient returns to the office for followup of ascending thoracic aortic aneurysm. He recently underwent aortic valve replacement and mitral valve repair in 2008. Last spring he underwent a followup echocardiogram that suggested interval increase size of the patient's aortic root. This prompted MRA of the thoracic aorta and subsequent referral here to our office. I saw the patient in consultation on 09/07/2011. He underwent followup CT angiogram last week and returns to our office for followup today. The patient reports that he continues to do very well from a cardiovascular standpoint. He reports no history of any chest pain or pain in his upper back. He denies any exertional shortness of breath. He reports that his blood pressure has been under fairly good control. His activity level is good and he is working several days a week.   Current Outpatient Prescriptions  Medication Sig Dispense Refill  . aspirin 81 MG tablet Take 81 mg by mouth daily.        . hydrochlorothiazide 25 MG tablet Take 25 mg by mouth daily.        Marland Kitchen lisinopril (PRINIVIL,ZESTRIL) 40 MG tablet Take 40 mg by mouth daily.        . metoprolol succinate (TOPROL-XL) 50 MG 24 hr tablet TAKE ONE-HALF TABLET BY MOUTH EVERY DAY  30 tablet  11  . niacin 500 MG tablet Take 500 mg by mouth daily with breakfast.      . pantoprazole (PROTONIX) 40 MG tablet Take 40 mg by mouth daily.      . simvastatin (ZOCOR) 20 MG tablet Take 20 mg by mouth at bedtime.            Physical Exam:   BP 134/88  Pulse 80  Resp 16  Ht 5\' 7"  (1.702 m)  Wt 205 lb (92.987 kg)  BMI 32.11 kg/m2  SpO2 100%  General:  Well-appearing  Chest:   Clear to auscultation with symmetrical breath sounds  CV:   Regular rate and rhythm with soft  grade 2/6 systolic murmur heard best at the right upper sternal border  Incisions:  Well-healed sternotomy scar  Abdomen:  Soft and nontender  Extremities:  Warm and well-perfused  Diagnostic Tests:  *RADIOLOGY REPORT*  INDICATION: 71 year old male with history of thoracic aortic  aneurysm.  CT ANGIOGRAPHY OF THE HEART, CORONARY ARTERY, STRUCTURE, AND  MORPHOLOGY  COMPARISON: Chest MRA 08/23/2011.  CONTRAST: 99mL OMNIPAQUE IOHEXOL 350 MG/ML SOLN  TECHNIQUE: CT angiography of the thoracic aorta and coronary  vessels was performed on a 256 channel system using prospective ECG  gating. A scout and ECG-gated noncontrast exam (for calcium  scoring) were performed. Appropriate delay was determined by bolus  tracking after injection of iodinated contrast, and an ECG-gated  coronary CTA was performed with sub-mm slice collimation during  late diastole. Imaging post processing was performed on an  independent workstation creating multiplanar and 3-D images,  allowing for quantitative analysis of the heart and coronary  arteries. Note that this exam targets the heart and the chest was  not imaged in its entirety.  PREMEDICATION:  Lopressor 5 mg, IV  Nitroglycerin 400 mcg, sublingual.  FINDINGS:  Technical quality: Excellent.  Heart rate: 53.  CORONARY ARTERIES:  Left Main: Mildly diseased with mixed calcified and  noncalcified atherosclerotic plaque resulting in only 0-25% ostial  stenosis.  LAD: Mildly diseased with a mixed calcified  noncalcified atherosclerotic plaque resulting in no significant  stenosis. Proximal LAD is aneurysmal measuring 9 mm in diameter.  Diagonals: Large D1 with two branches, and small  diagonals to 2, 3 and 4. Minimally diseased without any  significant stenosis.  LCx: Minimally diseased with calcified plaque  proximally resulting in no significant stenosis.  OMs: OM 1-4 are small caliber and negative for  significant disease.  RCA: Ectatic vessel with  minimal calcified plaque, but  no significant stenosis.  PDA: Negative.  Dominance: Codominant.  CORONARY CALCIUM:  Total Agatston Score: 189  MESA database percentile: 76th  AORTA AND PULMONARY MEASUREMENTS:  Aortic root (21 - 40 mm): 25 mm at the annulus  44 mm at the sinuses of Valsalva  52 mm at the sinotubular junction  (effaced)  Ascending aorta: (< 40 mm): 60 x 58 mm at the level 3.6 cm above  the annulus  Descending aorta: (< 40 mm): 29 mm  Main pulmonary artery: (< 30 mm): 23 mm  OTHER FINDINGS:  In the lateral aspect of the left upper lobe (image 7 of series 5)  there is a subsolid nodule measuring 11 x 11 mm. This is only  slightly larger compared to prior examination 08/12/2006 (at which  point it measured only 9 mm). However, today's study demonstrates  a central 4 mm solid component within the nodule (image 7 of series  4). No other suspicious appearing pulmonary nodules or masses are  otherwise noted. Some ground glass attenuation is noted in the  dependent portions of the lower lobes of the lungs bilaterally,  favored to represent some subsegmental atelectasis. No acute  consolidative air space disease. No pleural effusions. No  definite pathologically enlarged mediastinal or hilar lymph nodes  are identified. There are several small foci of high attenuation  associated with the wall of the ascending thoracic aorta which are  high attenuation on the noncontrast images, compatible with  pledgets at sites of prior cannulation. Small hiatal hernia. There  are no aggressive appearing lytic or blastic lesions noted in the  visualized portions of the skeleton. Sternotomy wires. Status  post aortic valve replacement with a stented bioprosthesis and  mitral annuloplasty. Aortic arch was incompletely visualized,  however, there appears to be a bovine type arch (normal variant).  IMPRESSION:  1. Ascending thoracic aortic aneurysm measuring up to 6.0 x 5.8 cm  at a level  approximately 3.6 cm above the aortic annulus.  2. Aneurysmal dilatation of the aortic root with complete  effacement of the sided tubular junction, as detailed above.  3. Mild nonobstructive coronary artery disease, as detailed above.  In addition, the proximal LAD is aneurysmal (9 mm), and there is  mild diffuse ectasia of the right coronary artery. The patient's  total coronary artery calcium score is 189 which is 76 percentile  for patient's of matched age, gender and race/ethnicity.  4. Interval enlargement of a mixed subsolid and solid nodule in  the left upper lobe. This has shown only slow growth compared to  remote prior study from 08/12/2006, however, the interval growth of  the subsolid portion, and the development of the central solid  nodule is concerning for potential slow-growing adenocarcinoma.  Initial follow-up by chest  CT without contrast is recommended in 3  months to confirm persistence. This recommendation follows the  consensus statement: Recommendations for the Management of Subsolid  Pulmonary Nodules Detected at CT: A Statement from the Fleischner  Society as published in Radiology 2013; 266:304-317.  5. Additional incidental findings, as above.  Original Report Authenticated By: Trudie Reed, M.D.    Impression:  The patient has asymptomatic aneurysmal enlargement of the aortic root and proximal ascending thoracic aorta with maximal transverse diameter approaching 6 cm. Comparison with the MRA performed last spring the aneurysm appears essentially stable or perhaps slightly larger in size, and the size is clearly confirmed on this cardiac gated imaging study.  There is only mild nonobstructive coronary artery disease in the proximal coronary arteries.  As an incidental finding the patient also was noted to have a small indeterminant pulmonary nodule in the left upper lobe which will need to be followed in the future.   Plan:  I've discussed the implications  of the patient's aneurysm involving the aortic root and proximal ascending thoracic aorta at length with Mr. Creeden here in the office today. I explained that because of the size of the aneurysm and the fact that it has perhaps increase slightly further in size over the past 8 months that there is a small but significant risk of acute aortic dissection or rupture which would care with it very risk high mortality.  Surgical repair would likely require aortic root replacement in this patient who has previously undergone aortic valve replacement with mitral valve repair. Risks of surgery will be considerable but certainly not insurmountable. The patient does not wish to consider elective surgery at this time.   Under the circumstances this is reasonable, but I warned him that there remains a small but significant risk of sudden death. I've also suggested that if the aneurysm continues to enlarge on subsequent followup exams we will more emphatically recommend urgent surgical intervention. In addition, we have discussed how important it will remain for him to keep his blood pressure under very tight control.  I have counseled him regarding signs and symptoms to be watchful for during the interval period of time. We will have him return in 3 months for repeat CT angiogram.   Salvatore Decent. Cornelius Moras, MD 03/17/2012 5:33 PM

## 2012-05-12 ENCOUNTER — Other Ambulatory Visit: Payer: Self-pay | Admitting: *Deleted

## 2012-05-12 DIAGNOSIS — I712 Thoracic aortic aneurysm, without rupture: Secondary | ICD-10-CM

## 2012-06-10 ENCOUNTER — Encounter: Payer: Self-pay | Admitting: Thoracic Surgery (Cardiothoracic Vascular Surgery)

## 2012-06-13 ENCOUNTER — Ambulatory Visit (INDEPENDENT_AMBULATORY_CARE_PROVIDER_SITE_OTHER): Payer: Medicare Other | Admitting: Thoracic Surgery (Cardiothoracic Vascular Surgery)

## 2012-06-13 ENCOUNTER — Encounter: Payer: Self-pay | Admitting: Thoracic Surgery (Cardiothoracic Vascular Surgery)

## 2012-06-13 ENCOUNTER — Ambulatory Visit
Admission: RE | Admit: 2012-06-13 | Discharge: 2012-06-13 | Disposition: A | Discharge status: Home / Self Care | Payer: Medicare Other | Source: Ambulatory Visit | Attending: Thoracic Surgery (Cardiothoracic Vascular Surgery) | Admitting: Thoracic Surgery (Cardiothoracic Vascular Surgery)

## 2012-06-13 VITALS — BP 124/78 | HR 65 | Resp 16 | Ht 67.0 in | Wt 205.0 lb

## 2012-06-13 DIAGNOSIS — I719 Aortic aneurysm of unspecified site, without rupture: Secondary | ICD-10-CM

## 2012-06-13 DIAGNOSIS — I712 Thoracic aortic aneurysm, without rupture: Secondary | ICD-10-CM

## 2012-06-13 DIAGNOSIS — R911 Solitary pulmonary nodule: Secondary | ICD-10-CM

## 2012-06-13 DIAGNOSIS — I77810 Thoracic aortic ectasia: Secondary | ICD-10-CM

## 2012-06-13 MED ORDER — IOHEXOL 300 MG/ML  SOLN
80.0000 mL | Freq: Once | INTRAMUSCULAR | Status: AC | PRN
  Administered 2012-06-13: 80 mL via INTRAVENOUS

## 2012-06-13 NOTE — Patient Instructions (Signed)
Call or go directly to the emergency room if you develop any significant pain in your chest or back

## 2012-06-13 NOTE — Progress Notes (Signed)
[address]            [name] [address]          [phone]     CARDIOTHORACIC SURGERY OFFICE NOTE  Referring Provider is Tonny Bollman, MD PCP is Irena Reichmann, DO   HPI:  Patient returns for followup of the ascending thoracic aortic aneurysm. He originally underwent aortic valve replacement and mitral valve repair in 2008. At that time his aorta was only mildly dilated. Followup echocardiogram performed last year suggested the interval increase in size of the patient's aortic root. This was confirmed on MRA of the thoracic aorta and I saw the patient in consultation on 09/07/2011. I last saw the patient on 03/17/2012 at which time he did not wish to consider elective surgical repair despite concerns regarding the risk of aortic dissection or rupture. He returns to the office today for further followup. He reports that over the last 3 months he has felt well clinically other problems that he is having is with his knee. He specifically denies any chest pain or back pain that could be in any way related to his aortic aneurysm. He has not had any shortness of breath either with activity or at rest. His activity level is pretty good and he continues to work. The remainder of his review of systems is unchanged. He reports that he has kept a close eye on his blood pressure management.   Current Outpatient Prescriptions  Medication Sig Dispense Refill  . aspirin 81 MG tablet Take 81 mg by mouth daily.        . hydrochlorothiazide 25 MG tablet Take 25 mg by mouth daily.        Marland Kitchen lisinopril (PRINIVIL,ZESTRIL) 40 MG tablet Take 40 mg by mouth daily.        . metoprolol succinate (TOPROL-XL) 50 MG 24 hr tablet TAKE ONE-HALF TABLET BY MOUTH EVERY DAY  30 tablet  11  . naproxen sodium (ANAPROX) 220 MG tablet Take 220 mg by mouth 2 (two) times daily with a meal.      . niacin 500 MG tablet Take 500 mg by mouth daily with breakfast.      . pantoprazole (PROTONIX)  40 MG tablet Take 40 mg by mouth daily.      . simvastatin (ZOCOR) 20 MG tablet Take 20 mg by mouth at bedtime.         No current facility-administered medications for this visit.      Physical Exam:   BP 124/78  Pulse 65  Resp 16  Ht 5\' 7"  (1.702 m)  Wt 205 lb (92.987 kg)  BMI 32.1 kg/m2  SpO2 98%  General:  Well-appearing  Chest:   Clear to auscultation  CV:   Regular rate and rhythm  Incisions:  n/a  Abdomen:  Soft and nontender  Extremities:  Warm and well-perfused  Diagnostic Tests:  *RADIOLOGY REPORT*  Clinical Data: Ascending aortic aneurysm  CT ANGIOGRAPHY CHEST  Technique: Multidetector CT imaging of the chest using the  standard protocol during bolus administration of intravenous  contrast. Multiplanar reconstructed images including MIPs were  obtained and reviewed to evaluate the vascular anatomy.  Contrast: 80mL OMNIPAQUE IOHEXOL 300 MG/ML SOLN  Comparison: 03/10/2012  Findings: Prosthetic aortic valve remains in place. Aortic  diameters at the sinus of Valsalva, sinotubular junction, and  ascending aorta are 4.7 cm, 5.2 cm, and  6.5 cm. This is not  appreciably changed compared the prior study and likely reflects a  variation in measurement technique. Using a similar technique, my  direct measurements of the maximal diameter of the ascending aorta  on the prior study was 6.5 cm.  Innominate artery, right common carotid artery, left common carotid  artery with a bovine arch, and left subclavian artery are patent.  Vertebral arteries are not clearly identified on today's study.  Negative abnormal mediastinal adenopathy.  Negative pericardial effusion.  No pneumothorax. No pleural effusion.  The irregular focal parenchymal density in the left upper lobe  measures 10 x 10 mm and is not significantly changed. No new  pulmonary nodule. Dependent atelectasis is present at the lung  bases.  No destructive bony process.  Simple cyst in the upper pole of the  right kidney. Cholelithiasis.  Punctate hyperdensity in the right lobe of the liver at table  position negative 203.75 may simply represent a calcification.  IMPRESSION:  Stable aneurysmal dilatation of the ascending aorta status post  prosthetic graft placement. Maximal diameter of the ascending  aorta based on my measuring technique is 6.5 cm and is without  change.  Vertebral arteries are not clearly opacified or visualized on this  study.  Stable left upper lobe irregular pulmonary parenchymal density.  Stability over 2 years supports benign etiology. Continued  followup at the 39-month interval is recommended.  Cholelithiasis.  Original Report Authenticated By: Jolaine Click, M.D.     Impression:  Stable aneurysmal enlargement of the ascending thoracic aorta. Over the past 3 months there does not appear to be any significant interval change in size. The patient remains asymptomatic year however, because of the somewhat large size of this aneurysm there is no question the patient is at risk for acute aortic dissection and/or rupture. At present if we assume the maximum transverse diameter of the aneurysm to measure 6.5 cm than his calculated aortic size index normalized to his right surface area would be 3.186, placing him that moderate risk for acute event with perhaps as high as 8% per year risk of acute rupture or dissection.  The left upper lobe irregular lung parenchymal lesion has remained stable.    Plan:  I spent in excess of 30 minutes again discussing matters with Mr. Jean in the office today. He clearly understands that there is risk of acute aortic dissection or rupture. We also discussed the associated risks of surgical intervention which although significant would still be less than the risk of not fixing his aneurysm in my opinion.  He is not interested in considering elective surgery at this time and he requests to continue to follow this closely with repeat CT angiogram in  3 months. He will call and return sooner if he develops any symptoms of chest pain or back pain or if he has further questions and desires to talk about surgery any further. All of his questions been addressed.   Salvatore Decent. Cornelius Moras, MD 06/13/2012 2:01 PM

## 2012-06-16 ENCOUNTER — Ambulatory Visit: Payer: Medicare Other | Admitting: Thoracic Surgery (Cardiothoracic Vascular Surgery)

## 2012-08-22 ENCOUNTER — Other Ambulatory Visit: Payer: Self-pay | Admitting: *Deleted

## 2012-08-22 DIAGNOSIS — I712 Thoracic aortic aneurysm, without rupture: Secondary | ICD-10-CM

## 2012-09-26 ENCOUNTER — Other Ambulatory Visit: Payer: Medicare Other

## 2012-09-29 ENCOUNTER — Ambulatory Visit (INDEPENDENT_AMBULATORY_CARE_PROVIDER_SITE_OTHER): Payer: Medicare Other | Admitting: Thoracic Surgery (Cardiothoracic Vascular Surgery)

## 2012-09-29 ENCOUNTER — Other Ambulatory Visit: Payer: Self-pay | Admitting: *Deleted

## 2012-09-29 ENCOUNTER — Ambulatory Visit
Admission: RE | Admit: 2012-09-29 | Discharge: 2012-09-29 | Disposition: A | Discharge status: Home / Self Care | Payer: Medicare Other | Source: Ambulatory Visit | Attending: Thoracic Surgery (Cardiothoracic Vascular Surgery) | Admitting: Thoracic Surgery (Cardiothoracic Vascular Surgery)

## 2012-09-29 ENCOUNTER — Encounter: Payer: Self-pay | Admitting: Thoracic Surgery (Cardiothoracic Vascular Surgery)

## 2012-09-29 VITALS — BP 126/84 | HR 84 | Resp 20 | Ht 67.0 in | Wt 214.0 lb

## 2012-09-29 DIAGNOSIS — R0602 Shortness of breath: Secondary | ICD-10-CM

## 2012-09-29 DIAGNOSIS — I712 Thoracic aortic aneurysm, without rupture, unspecified: Secondary | ICD-10-CM | POA: Insufficient documentation

## 2012-09-29 MED ORDER — IOHEXOL 350 MG/ML SOLN
80.0000 mL | Freq: Once | INTRAVENOUS | Status: AC | PRN
  Administered 2012-09-29: 80 mL via INTRAVENOUS

## 2012-09-29 NOTE — Progress Notes (Signed)
[address]       [name] [address]             [phone]     CARDIOTHORACIC SURGERY OFFICE NOTE  Referring Provider is Tonny Bollman, MD PCP is Irena Reichmann, DO   HPI:  Patient returns for followup of ascending thoracic aortic aneurysm. He originally underwent aortic valve replacement and mitral valve repair in 2008. At that time his proximal ascending aorta was only mildly dilated. Followup echocardiograms suggested the interval increase in size of the patient's aortic root, which was confirmed on MRA of the thoracic aorta.  I saw the patient in consultation on 09/07/2011 and have been following him closely ever since, most recently on 06/13/2012.  Thomas Henderson has remained reluctant to consider elective surgical repair despite concerns regarding the risk of aortic dissection or rupture. He has remained clinically stable and he specifically denies any pain in his chest or back that might be any way related to his ascending thoracic aortic aneurysm. He reports that his blood pressure has remained under reasonably good control, although he notes that time systolic pressure may get up to a size 140 or 150 mmHg.  He reports stable symptoms of chronic exertional shortness of breath and fatigue. He continues to work and overall he is getting along fairly well. He returns to the office today for followup with his wife present, per my request at the time of his last office visit.    Current Outpatient Prescriptions  Medication Sig Dispense Refill  . aspirin 81 MG tablet Take 81 mg by mouth daily.        Marland Kitchen gabapentin (NEURONTIN) 300 MG capsule Take 300 mg by mouth 3 (three) times daily.       . hydrochlorothiazide 25 MG tablet Take 25 mg by mouth daily.        . hydroxychloroquine (PLAQUENIL) 200 MG tablet Take 200 mg by mouth daily.       Marland Kitchen lisinopril (PRINIVIL,ZESTRIL) 40 MG tablet Take 40 mg by mouth daily.        . metoprolol succinate (TOPROL-XL) 50 MG 24 hr tablet  TAKE ONE-HALF TABLET BY MOUTH EVERY DAY  30 tablet  11  . niacin 500 MG tablet Take 500 mg by mouth daily with breakfast.      . pantoprazole (PROTONIX) 40 MG tablet Take 40 mg by mouth daily.      . simvastatin (ZOCOR) 20 MG tablet Take 20 mg by mouth at bedtime.         No current facility-administered medications for this visit.      Physical Exam:   BP 126/84  Pulse 84  Resp 20  Ht 5\' 7"  (1.702 m)  Wt 214 lb (97.07 kg)  BMI 33.51 kg/m2  SpO2 97%  General:  Well-appearing  Chest:   Clear to auscultation  CV:   Regular rate and rhythm without murmur  Incisions:  n/a  Abdomen:  Soft and nontender  Extremities:  Warm and well-perfused  Diagnostic Tests:  *RADIOLOGY REPORT*  Clinical Data: 68-month follow-up for thoracic aortic aneurysm.  CT ANGIOGRAPHY CHEST  Technique: Multidetector CT imaging of the chest using the  standard protocol during bolus administration of intravenous  contrast. Multiplanar reconstructed images including MIPs were  obtained and reviewed to evaluate the vascular anatomy.  Contrast: 80mL OMNIPAQUE IOHEXOL 350 MG/ML SOLN  Comparison: CTA of the thorax 06/13/2012.  Findings:  Mediastinum: Postoperative changes of median sternotomy for aortic  valve replacement and mitral valve annuloplasty noted. There is  persistent aneurysmal dilatation of the aortic root. This was  measured on the double oblique images through the aortic root, with  estimated measurements on this non gated CT examination of 30 mm at  the annulus, the 58 mm at the sinuses of Valsalva, and  approximately 54 mm at the sinotubular junction. No evidence of  thoracic aortic dissection at this time. The arch and descending  thoracic aorta are normal in caliber measuring 2.8 and 2.6 cm in  diameter respectively. Additionally, the ascending thoracic aorta  measures approximately 6.5 x 5.6 cm when measured orthogonal to the  direction of blood flow at the level of the pulmonic try Heart  size  is normal. There is no significant pericardial fluid, thickening or  pericardial calcification. Multiple reactive size mediastinal lymph  nodes, similar to the prior examination, the largest of which  measures up to 1 cm in short axis in the low right paratracheal  region, presumably reactive. No definite pathologic mediastinal or  hilar lymphadenopathy. Small hiatal hernia.  Lungs/Pleura: No acute consolidative airspace disease. No pleural  effusions. No suspicious appearing pulmonary nodules or masses.  Multifocal areas of mild pleural thickening, presumably some mild  postoperative scarring. A small Bochdalek hernia in the medial  aspect of the right hemithorax incidentally noted.  Upper Abdomen: A small gallstone noted gallbladder. 4.1 cm simple  cyst in the upper pole of the right kidney again noted.  Musculoskeletal: Sternotomy wires. There are no aggressive  appearing lytic or blastic lesions noted in the visualized portions  of the skeleton.  IMPRESSION:  1. Aneurysmal dilatation of the aortic root and descending thoracic  aorta, as above. Direct comparison with prior examinations is  challenging on this non gated CT examination, however, the size of  the aneurysm is favored to be unchanged.  2. Small hiatal hernia.  3. Cholelithiasis.  4. Additional incidental findings, as above.  Original Report Authenticated By: Trudie Reed, M.D.    Impression:  Stable aneurysmal enlargement of the aortic root and proximal ascending thoracic aorta with maximal transverse diameter approaching 6 cm. Over the past 3 months there does not appear to be any significant interval change in size and the patient remains asymptomatic.  However, because of the somewhat large size of this aneurysm there is no question the patient is at risk for acute aortic dissection and/or rupture.  Blood pressure control seems acceptable.   Plan:  I've discussed matters at length with Thomas Henderson and his  wife in the office today. He is now more amenable to the idea of elective surgical replacement of his aortic root and proximal ascending thoracic aorta.  Cardiac gated CT angiogram of the heart performed last December did not reveal any signs of significant proximal coronary artery disease, and in the distant past cardiac catheterization was notable for nonobstructive disease.  We will plan to discuss with Dr. [name] in colleagues whether or not diagnostic catheterization should be performed with elective surgical intervention were planned. We will obtain a followup echocardiogram to reassess the function of his aortic valve prosthesis, mitral valve repair, and left ventricular function. The patient will return in 2 weeks to discuss options further and possibly make final plans for surgery.   Thomas Decent. Cornelius Moras, MD 09/29/2012 3:49 PM

## 2012-10-01 ENCOUNTER — Ambulatory Visit (HOSPITAL_COMMUNITY)
Admission: RE | Admit: 2012-10-01 | Discharge: 2012-10-01 | Disposition: A | Discharge status: Home / Self Care | Payer: Medicare Other | Source: Ambulatory Visit | Attending: Cardiothoracic Surgery | Admitting: Cardiothoracic Surgery

## 2012-10-01 ENCOUNTER — Telehealth: Payer: Self-pay | Admitting: Cardiovascular Disease

## 2012-10-01 DIAGNOSIS — I509 Heart failure, unspecified: Secondary | ICD-10-CM | POA: Insufficient documentation

## 2012-10-01 DIAGNOSIS — E785 Hyperlipidemia, unspecified: Secondary | ICD-10-CM | POA: Insufficient documentation

## 2012-10-01 DIAGNOSIS — I10 Essential (primary) hypertension: Secondary | ICD-10-CM | POA: Insufficient documentation

## 2012-10-01 DIAGNOSIS — I712 Thoracic aortic aneurysm, without rupture, unspecified: Secondary | ICD-10-CM | POA: Insufficient documentation

## 2012-10-01 DIAGNOSIS — I519 Heart disease, unspecified: Secondary | ICD-10-CM

## 2012-10-01 DIAGNOSIS — R0602 Shortness of breath: Secondary | ICD-10-CM

## 2012-10-01 NOTE — Telephone Encounter (Signed)
Message sent to Dr [name] for review of previous cath films.

## 2012-10-01 NOTE — Progress Notes (Signed)
  Echocardiogram 2D Echocardiogram has been performed.  Thomas Henderson 10/01/2012, 11:43 AM 

## 2012-10-01 NOTE — Telephone Encounter (Signed)
New problem    Cindy/Dr Cornelius Moras wanting to know when Cath is going to be scheduled. Please call

## 2012-10-01 NOTE — Telephone Encounter (Signed)
I spoke with Thomas Henderson and made her aware of the message that Dr [name] sent to Dr Cornelius Moras through Epic. If Dr Cornelius Moras would like to proceed with cardiac cath then she will contact our office.

## 2012-10-02 ENCOUNTER — Other Ambulatory Visit (HOSPITAL_COMMUNITY): Payer: Medicare Other

## 2012-10-07 ENCOUNTER — Telehealth: Payer: Self-pay | Admitting: Cardiovascular Disease

## 2012-10-07 NOTE — Telephone Encounter (Signed)
Dr Cornelius Moras would like the pt to proceed with cardiac catheterization.  Procedure scheduled on 10/13/12 and instructions given to the patient by phone. Instructions also mailed to the pt.

## 2012-10-08 ENCOUNTER — Encounter (HOSPITAL_COMMUNITY): Payer: Self-pay | Admitting: Respiratory Therapy

## 2012-10-13 ENCOUNTER — Encounter (HOSPITAL_COMMUNITY)
Admission: RE | Disposition: A | Discharge status: Home / Self Care | Payer: Self-pay | Source: Ambulatory Visit | Attending: Cardiovascular Disease

## 2012-10-13 ENCOUNTER — Other Ambulatory Visit: Payer: Self-pay | Admitting: Cardiovascular Disease

## 2012-10-13 ENCOUNTER — Ambulatory Visit (HOSPITAL_COMMUNITY)
Admission: RE | Admit: 2012-10-13 | Discharge: 2012-10-13 | Disposition: A | Discharge status: Home / Self Care | Payer: Medicare Other | Source: Ambulatory Visit | Attending: Cardiovascular Disease | Admitting: Cardiovascular Disease

## 2012-10-13 ENCOUNTER — Other Ambulatory Visit: Payer: Self-pay

## 2012-10-13 DIAGNOSIS — Z954 Presence of other heart-valve replacement: Secondary | ICD-10-CM | POA: Insufficient documentation

## 2012-10-13 DIAGNOSIS — I080 Rheumatic disorders of both mitral and aortic valves: Secondary | ICD-10-CM | POA: Insufficient documentation

## 2012-10-13 DIAGNOSIS — I491 Atrial premature depolarization: Secondary | ICD-10-CM | POA: Insufficient documentation

## 2012-10-13 DIAGNOSIS — Z79899 Other long term (current) drug therapy: Secondary | ICD-10-CM | POA: Insufficient documentation

## 2012-10-13 DIAGNOSIS — Z7982 Long term (current) use of aspirin: Secondary | ICD-10-CM | POA: Insufficient documentation

## 2012-10-13 DIAGNOSIS — I712 Thoracic aortic aneurysm, without rupture, unspecified: Secondary | ICD-10-CM | POA: Insufficient documentation

## 2012-10-13 DIAGNOSIS — I509 Heart failure, unspecified: Secondary | ICD-10-CM | POA: Insufficient documentation

## 2012-10-13 DIAGNOSIS — N529 Male erectile dysfunction, unspecified: Secondary | ICD-10-CM | POA: Insufficient documentation

## 2012-10-13 DIAGNOSIS — I70209 Unspecified atherosclerosis of native arteries of extremities, unspecified extremity: Secondary | ICD-10-CM

## 2012-10-13 DIAGNOSIS — E785 Hyperlipidemia, unspecified: Secondary | ICD-10-CM | POA: Insufficient documentation

## 2012-10-13 DIAGNOSIS — I10 Essential (primary) hypertension: Secondary | ICD-10-CM | POA: Insufficient documentation

## 2012-10-13 HISTORY — PX: LEFT AND RIGHT HEART CATHETERIZATION WITH CORONARY ANGIOGRAM: SHX5449

## 2012-10-13 LAB — POCT I-STAT 3, VENOUS BLOOD GAS (G3P V)
Acid-base deficit: 4 mmol/L — ABNORMAL HIGH (ref 0.0–2.0)
Acid-base deficit: 5 mmol/L — ABNORMAL HIGH (ref 0.0–2.0)
Bicarbonate: 20.4 mEq/L (ref 20.0–24.0)
O2 Saturation: 76 %
TCO2: 22 mmol/L (ref 0–100)
TCO2: 23 mmol/L (ref 0–100)
pCO2, Ven: 41.5 mmHg — ABNORMAL LOW (ref 45.0–50.0)
pH, Ven: 7.313 — ABNORMAL HIGH (ref 7.250–7.300)
pO2, Ven: 40 mmHg (ref 30.0–45.0)
pO2, Ven: 44 mmHg (ref 30.0–45.0)

## 2012-10-13 LAB — POCT I-STAT 3, ART BLOOD GAS (G3+)
O2 Saturation: 95 %
TCO2: 23 mmol/L (ref 0–100)

## 2012-10-13 LAB — CBC
Hemoglobin: 13.4 g/dL (ref 13.0–17.0)
MCH: 30.9 pg (ref 26.0–34.0)
MCV: 90.3 fL (ref 78.0–100.0)
Platelets: 225 10*3/uL (ref 150–400)
RBC: 4.33 MIL/uL (ref 4.22–5.81)
WBC: 4.2 10*3/uL (ref 4.0–10.5)

## 2012-10-13 LAB — BASIC METABOLIC PANEL
CO2: 22 mEq/L (ref 19–32)
Calcium: 9.7 mg/dL (ref 8.4–10.5)
Glucose, Bld: 91 mg/dL (ref 70–99)
Potassium: 3.7 mEq/L (ref 3.5–5.1)
Sodium: 135 mEq/L (ref 135–145)

## 2012-10-13 LAB — PROTIME-INR: INR: 1.01 (ref 0.00–1.49)

## 2012-10-13 SURGERY — LEFT AND RIGHT HEART CATHETERIZATION WITH CORONARY ANGIOGRAM
Anesthesia: LOCAL

## 2012-10-13 MED ORDER — LIDOCAINE HCL (PF) 1 % IJ SOLN
INTRAMUSCULAR | Status: AC
  Filled 2012-10-13: qty 30

## 2012-10-13 MED ORDER — SODIUM CHLORIDE 0.9 % IJ SOLN: 3.0000 mL | Freq: Two times a day (BID) | INTRAMUSCULAR | Status: DC

## 2012-10-13 MED ORDER — MIDAZOLAM HCL 2 MG/2ML IJ SOLN
INTRAMUSCULAR | Status: AC
  Filled 2012-10-13: qty 2

## 2012-10-13 MED ORDER — NITROGLYCERIN 0.2 MG/ML ON CALL CATH LAB
INTRAVENOUS | Status: AC
  Filled 2012-10-13: qty 1

## 2012-10-13 MED ORDER — SODIUM CHLORIDE 0.9 % IV SOLN: 250.0000 mL | INTRAVENOUS | Status: DC | PRN

## 2012-10-13 MED ORDER — FENTANYL CITRATE 0.05 MG/ML IJ SOLN
INTRAMUSCULAR | Status: AC
  Filled 2012-10-13: qty 2

## 2012-10-13 MED ORDER — SODIUM CHLORIDE 0.9 % IV SOLN
INTRAVENOUS | Status: DC
  Administered 2012-10-13: 11:00:00 via INTRAVENOUS

## 2012-10-13 MED ORDER — HEPARIN (PORCINE) IN NACL 2-0.9 UNIT/ML-% IJ SOLN
INTRAMUSCULAR | Status: AC
  Filled 2012-10-13: qty 1000

## 2012-10-13 MED ORDER — DIAZEPAM 5 MG PO TABS
5.0000 mg | ORAL_TABLET | ORAL | Status: AC
  Administered 2012-10-13: 5 mg via ORAL
  Filled 2012-10-13: qty 1

## 2012-10-13 MED ORDER — ASPIRIN 81 MG PO CHEW
324.0000 mg | CHEWABLE_TABLET | ORAL | Status: AC
  Administered 2012-10-13: 324 mg via ORAL
  Filled 2012-10-13: qty 4

## 2012-10-13 MED ORDER — ACETAMINOPHEN 325 MG PO TABS: 650.0000 mg | ORAL_TABLET | ORAL | Status: DC | PRN

## 2012-10-13 MED ORDER — ONDANSETRON HCL 4 MG/2ML IJ SOLN: 4.0000 mg | Freq: Four times a day (QID) | INTRAMUSCULAR | Status: DC | PRN

## 2012-10-13 MED ORDER — SODIUM CHLORIDE 0.9 % IV SOLN: 1.0000 mL/kg/h | INTRAVENOUS | Status: DC

## 2012-10-13 MED ORDER — SODIUM CHLORIDE 0.9 % IJ SOLN: 3.0000 mL | INTRAMUSCULAR | Status: DC | PRN

## 2012-10-13 NOTE — Progress Notes (Signed)
CLIENT UP AND WALKED AND TOL WELL AND VOIDED; RIGHT GROIN STABLE; NO BLEEDING OR HEMATOMA

## 2012-10-13 NOTE — H&P (Signed)
History and Physical  Patient ID: Thomas Henderson MRN: [id number], SOB: 12-28-1940 72 y.o. Date of Encounter: 10/13/2012, 1:48 PM  Primary Physician: Irena Reichmann, DO Primary Cardiologist: Tonny Bollman, MD  Chief Complaint: Ascending aortic aneurysm  HPI: 72 y.o. male w/ PMHx significant for aortic and mitral insufficiency who presented to [facility] on 10/13/2012 for cardiac catheterization as part of preop eval for upcoming repair of an ascending aortic aneurysm.  The patient has been followed with serial CT angiograms of the chest. This is demonstrated enlargement of his aortic root and proximal ascending thoracic aorta. The patient is contemplating aortic root replacement and he has been carefully evaluated by Dr. Cornelius Moras with cardiac surgery. He presents today for preoperative cardiac catheterization.  From a symptomatic perspective, he is doing well. He denies chest pain or pressure, edema, or palpitations. He's had no lightheadedness or syncope. He has mild dyspnea with exertion and this is unchanged over time. He is also followed for essential hypertension and his blood pressure control has been good.  Past Medical History  Diagnosis Date  . Mitral regurgitation     a. 2008 - MVR w/ 26mm annuloplasty ring;  b. 12/2007 Echo Triv MR  . Aortic insufficiency     a. 2008 - AVR w/ 25mm pericardial tissue valve;  a. 12/2007 Echo No AI.  Marland Kitchen HTN (hypertension)   . CHF (congestive heart failure)     a. h/o severe LV dysfxn in setting of valvular dzs in 2008;  b. 12/2007 Echo: EF 55-60%, No RWMA, No AI, Mod Ao Root Dil, Triv MR/PR/TR, NL RV.  Marland Kitchen Dyslipidemia   . ED (erectile dysfunction)   . Aortic root dilatation     a. 12/2007 Echo: Ao Root 48mm  . S/P aortic valve replacement with bioprosthetic valve 08/13/2006    #79mm [facility] Magna pericardial tissue valve  . S/P mitral valve repair 08/13/2006    #51mm [facility] Physio ring annuloplasty  . Thoracic ascending aortic aneurysm 09/07/2011      Surgical History:  Past Surgical History  Procedure Laterality Date  . Leg surgery      left  . Eye surgery      left  . Tonsillectomy    . Lumbar laminectomy    . Lumbar disc surgery    . Aortic valve replacement  08/13/2006    #81mm [facility] pericardial tissue valve  . Cardiac catheterization  07/31/06  . Mitral valve repair  08/13/2006    #98mm Edwards Physio ring annuloplasty     Home Meds: Prior to Admission medications   Medication Sig Start Date End Date Taking? Authorizing Provider  aspirin 81 MG tablet Take 81 mg by mouth daily.     Yes Historical Provider, MD  gabapentin (NEURONTIN) 300 MG capsule Take 300 mg by mouth daily.  09/09/12  Yes Historical Provider, MD  hydrochlorothiazide 25 MG tablet Take 25 mg by mouth daily.     Yes Historical Provider, MD  hydroxychloroquine (PLAQUENIL) 200 MG tablet Take 200 mg by mouth daily.  07/24/12  Yes Historical Provider, MD  lisinopril (PRINIVIL,ZESTRIL) 40 MG tablet Take 40 mg by mouth daily.     Yes Historical Provider, MD  metoprolol succinate (TOPROL-XL) 50 MG 24 hr tablet Take 25 mg by mouth daily. Take with or immediately following a meal.   Yes Historical Provider, MD  niacin 500 MG tablet Take 500 mg by mouth daily with breakfast.   Yes Historical Provider, MD  pantoprazole (PROTONIX) 40  MG tablet Take 40 mg by mouth daily.   Yes Historical Provider, MD  simvastatin (ZOCOR) 20 MG tablet Take 20 mg by mouth at bedtime.     Yes Historical Provider, MD    Allergies: No Known Allergies  History   Social History  . Marital Status: Married    Spouse Name: N/A    Number of Children: 3  . Years of Education: N/A   Occupational History  . retired Curator    Social History Main Topics  . Smoking status: Never Smoker   . Smokeless tobacco: Not on file  . Alcohol Use: No  . Drug Use: Not on file  . Sexually Active: Not on file   Other Topics Concern  . Not on file   Social History Narrative  . No narrative  on file     Family History  Problem Relation Age of Onset  . Cardiomyopathy    . Heart attack Brother 65  . Other Mother 42    Enlarged heart    Review of Systems: General: negative for chills, fever, night sweats or weight changes.  ENT: negative for rhinorrhea or epistaxis Cardiovascular: negative for chest pain, shortness of breath, dyspnea on exertion, edema, orthopnea, palpitations, or paroxysmal nocturnal dyspnea Dermatological: negative for rash Respiratory: negative for cough or wheezing GI: negative for nausea, vomiting, diarrhea, bright red blood per rectum, melena, or hematemesis GU: no hematuria, urgency, or frequency Neurologic: negative for visual changes, syncope, headache, or dizziness Heme: no easy bruising or bleeding Endo: negative for excessive thirst, thyroid disorder, or flushing Musculoskeletal: negative for joint pain or swelling, negative for myalgias All other systems reviewed and are otherwise negative except as noted above.  Physical Exam: Blood pressure 130/91, pulse 61, temperature 98.1 F (36.7 C), temperature source Oral, resp. rate 20, height 5\' 6"  (1.676 m), weight 96.163 kg (212 lb), SpO2 100.00%. General: Well developed, well nourished, alert and oriented, in no acute distress. HEENT: Normocephalic, atraumatic, sclera non-icteric, no xanthomas, nares are without discharge.  Neck: Supple. Carotids 2+ without bruits. JVP normal Lungs: Clear bilaterally to auscultation without wheezes, rales, or rhonchi. Breathing is unlabored. Heart: RRR with normal S1 and S2. No murmurs, rubs, or gallops appreciated. Abdomen: Soft, non-tender, non-distended with normoactive bowel sounds. No hepatomegaly. No rebound/guarding. No obvious abdominal masses. Back: No CVA tenderness Msk:  Strength and tone appear normal for age. Extremities: No clubbing, cyanosis, or edema.  Distal pedal pulses are 2+ and equal bilaterally. Neuro: CNII-XII intact, moves all  extremities spontaneously. Psych:  Responds to questions appropriately with a normal affect.   Labs:   Lab Results  Component Value Date   WBC 4.2 10/13/2012   HGB 13.4 10/13/2012   HCT 39.1 10/13/2012   MCV 90.3 10/13/2012   PLT 225 10/13/2012    Recent Labs Lab 10/13/12 1144  NA 135  K 3.7  CL 102  CO2 22  BUN 28*  CREATININE 1.15  CALCIUM 9.7  GLUCOSE 91   No results found for this basename: CKTOTAL, CKMB, TROPONINI,  in the last 72 hours No results found for this basename: CHOL, HDL, LDLCALC, TRIG   No results found for this basename: DDIMER    Radiology/Studies:  Ct Angio Chest Aorta W/cm &/or Wo/cm  09/29/2012   *RADIOLOGY REPORT*  Clinical Data: 84-month follow-up for thoracic aortic aneurysm.  CT ANGIOGRAPHY CHEST  Technique:  Multidetector CT imaging of the chest using the standard protocol during bolus administration of intravenous contrast. Multiplanar reconstructed images  including MIPs were obtained and reviewed to evaluate the vascular anatomy.  Contrast: 80mL OMNIPAQUE IOHEXOL 350 MG/ML SOLN  Comparison: CTA of the thorax 06/13/2012.  Findings:  Mediastinum: Postoperative changes of median sternotomy for aortic valve replacement and mitral valve annuloplasty noted.  There is persistent aneurysmal dilatation of the aortic root.  This was measured on the double oblique images through the aortic root, with estimated measurements on this non gated CT examination of 30 mm at the annulus, the 58 mm at the sinuses of Valsalva, and approximately 54 mm at the sinotubular junction.  No evidence of thoracic aortic dissection at this time.  The arch and descending thoracic aorta are normal in caliber measuring 2.8 and 2.6 cm in diameter respectively.  Additionally, the ascending thoracic aorta measures approximately 6.5 x 5.6 cm when measured orthogonal to the direction of blood flow at the level of the pulmonic try Heart size is normal. There is no significant pericardial fluid,  thickening or pericardial calcification. Multiple reactive size mediastinal lymph nodes, similar to the prior examination, the largest of which measures up to 1 cm in short axis in the low right paratracheal region, presumably reactive.  No definite pathologic mediastinal or hilar lymphadenopathy.  Small hiatal hernia.  Lungs/Pleura: No acute consolidative airspace disease.  No pleural effusions.  No suspicious appearing pulmonary nodules or masses. Multifocal areas of mild pleural thickening, presumably some mild postoperative scarring.  A small Bochdalek hernia in the medial aspect of the right hemithorax incidentally noted.  Upper Abdomen: A small gallstone noted gallbladder.  4.1 cm simple cyst in the upper pole of the right kidney again noted.  Musculoskeletal: Sternotomy wires. There are no aggressive appearing lytic or blastic lesions noted in the visualized portions of the skeleton.  IMPRESSION: 1. Aneurysmal dilatation of the aortic root and descending thoracic aorta, as above.  Direct comparison with prior examinations is challenging on this non gated CT examination, however, the size of the aneurysm is favored to be unchanged. 2.  Small hiatal hernia.  3.  Cholelithiasis. 4.  Additional incidental findings, as above.   Original Report Authenticated By: Trudie Reed, M.D.     EKG: Normal sinus rhythm with left ventricular hypertrophy, PAC, no significant ST or T wave changes  ASSESSMENT AND PLAN:  72 year old gentleman with ascending thoracic aortic aneurysm. He presents today for preoperative cardiac catheterization. Will plan on a right and left heart catheterization for full hemodynamic and angiographic assessment. I have reviewed the risks, indications, and alternatives to the procedure with the patient. He understands and agrees to proceed.  Signed, Tonny Bollman, MD 10/13/2012, 1:48 PM

## 2012-10-13 NOTE — CV Procedure (Signed)
   Cardiac Catheterization Procedure Note  Name: Thomas Henderson MRN: [id number] DOB: [birth year]  Procedure: Right Heart Cath, Left Heart Cath, Selective Coronary Angiography, aortic root angiogram  Indication: Preoperative cardiac cath for ascending thoracic aortic surgery   Procedural Details: The right groin was prepped, draped, and anesthetized with 1% lidocaine. Using the modified Seldinger technique a 5 French sheath was placed in the right femoral artery and a 7 French sheath was placed in the right femoral vein. A Swan-Ganz catheter was used for the right heart catheterization. Standard protocol was followed for recording of right heart pressures and sampling of oxygen saturations. Fick cardiac output was calculated. Standard Judkins catheters were used for selective coronary angiography and  Aortic root angiography. A JL 6 catheter was used for imaging of the left coronary artery, a JR 4 catheter was used for the right coronary artery. There were no immediate procedural complications. The patient was transferred to the post catheterization recovery area for further monitoring.  Procedural Findings: Hemodynamics RA 7 RV 29/7 PA 32/16 with a mean of 23 PCWP 15 LV not recorded AO 115/77 with a mean of 94  Oxygen saturations: PA 70 SVC 76 AO 95  Cardiac Output (Fick) 7.9  Cardiac Index (Fick) 3.8   Coronary angiography: Coronary dominance: right  Left mainstem: Arises from the left cusp. The distal left main is dilated. There is no obstructive disease. There is swirling of contrast likely due to the large caliber of the left main.  Left anterior descending (LAD): The LAD is dilated throughout its proximal aspect with significant diffuse ectasia. The mid LAD after the first diagonal tapers to a normal caliber vessel without significant obstructive disease. However, there is diffuse irregularity throughout the coronaries suggestive of nonobstructive plaque. There are no significant  stenoses throughout the distribution of the LAD.  Left circumflex (LCx): The left circumflex shows diffuse irregularity. The obtuse marginal branches are small without significant stenoses.  Right coronary artery (RCA): The RCA is dilated throughout its proximal and mid portions. At the junction of the mid and distal vessel, the RCA tapers into a normal caliber vessel. The distal RCA has diffuse irregularity extending into the PDA and PLA branches, both of which are small in caliber.  Left ventriculography: Deferred  Aortic root angiography: The aortic root and proximal and ascending aorta are markedly dilated. There is no aortic valve insufficiency identified.   Final Conclusions:   1. Markedly dilated aortic root and ascending thoracic aorta 2. Ectatic coronary arteries with diffuse nonobstructive CAD 3. Normal right heart hemodynamics  Tonny Bollman 10/13/2012, 3:14 PM

## 2012-10-16 ENCOUNTER — Ambulatory Visit (INDEPENDENT_AMBULATORY_CARE_PROVIDER_SITE_OTHER): Payer: Medicare Other | Admitting: Thoracic Surgery (Cardiothoracic Vascular Surgery)

## 2012-10-16 ENCOUNTER — Encounter: Payer: Self-pay | Admitting: Thoracic Surgery (Cardiothoracic Vascular Surgery)

## 2012-10-16 VITALS — BP 119/79 | HR 74 | Resp 16 | Ht 67.0 in | Wt 214.0 lb

## 2012-10-16 DIAGNOSIS — I712 Thoracic aortic aneurysm, without rupture, unspecified: Secondary | ICD-10-CM

## 2012-10-16 DIAGNOSIS — I77810 Thoracic aortic ectasia: Secondary | ICD-10-CM

## 2012-10-16 NOTE — Progress Notes (Signed)
[address]       [name] [address]             [phone]     CARDIOTHORACIC SURGERY OFFICE NOTE  Referring Provider is Tonny Bollman, MD PCP is [name], DANA, DO   HPI:  Patient returns for followup of the ascending thoracic aortic aneurysm. He was last seen here in the office on 09/29/2012. His then he underwent followup echocardiogram and cardiac catheterization.  He returns to the office today to further discuss the results of these tests and make final plans for surgery. He notes that he has had some mild hoarseness of his voice. He otherwise feels fine with no new problems or complaints. Specifically denies any pain in his chest or back which could be related to his aorta.   Current Outpatient Prescriptions  Medication Sig Dispense Refill  . aspirin 81 MG tablet Take 81 mg by mouth daily.        Marland Kitchen gabapentin (NEURONTIN) 300 MG capsule Take 300 mg by mouth daily.       . hydrochlorothiazide 25 MG tablet Take 25 mg by mouth daily.        . hydroxychloroquine (PLAQUENIL) 200 MG tablet Take 200 mg by mouth daily.       Marland Kitchen lisinopril (PRINIVIL,ZESTRIL) 40 MG tablet Take 40 mg by mouth daily.        . metoprolol succinate (TOPROL-XL) 50 MG 24 hr tablet Take 25 mg by mouth daily. Take with or immediately following a meal.      . niacin 500 MG tablet Take 500 mg by mouth daily with breakfast.      . pantoprazole (PROTONIX) 40 MG tablet Take 40 mg by mouth daily.      . simvastatin (ZOCOR) 20 MG tablet Take 20 mg by mouth at bedtime.         No current facility-administered medications for this visit.      Physical Exam:   BP 119/79  Pulse 74  Resp 16  Ht 5\' 7"  (1.702 m)  Wt 214 lb (97.07 kg)  BMI 33.51 kg/m2  SpO2 96%  General:  Well-appearing  Chest:   Clear to auscultation with symmetrical breath sounds  CV:   Regular rate and rhythm without murmur  Incisions:  n/a  Abdomen:  Soft and nontender  Extremities:  Warm and  well-perfused  Diagnostic Tests:  Transthoracic Echocardiography  Patient: Thomas Henderson, Thomas Henderson MR #: [id number] Study Date: 10/01/2012 Gender: M Age: 72 Height: 170.2cm Weight: 97.1kg BSA: 2.81m^2 Pt. Status: Room:  PERFORMING [place], [facility] MD REFERRING Tressie Stalker MD ATTENDING Lovett Sox SONOGRAPHER [address] RDCS REFERRING Irena Reichmann cc:  ------------------------------------------------------------ LV EF: 60% - 65%  ------------------------------------------------------------ Indications: Aneurysm - thoracic 441.2. CHF - 428.0.  ------------------------------------------------------------ History: Risk factors: Hypertension. Dyslipidemia.  ------------------------------------------------------------ Study Conclusions  - Left ventricle: The cavity size was normal. Wall thickness was normal. Systolic function was normal. The estimated ejection fraction was in the range of 60% to 65%. Wall motion was normal; there were no regional wall motion abnormalities. Indeterminant diastolic function. - Aortic valve: Bioprosthetic aortic valve. No significant bioprosthetic valve stenosis. No regurgitation. Mean gradient: 8mm Hg (S). - Aorta: Severely dilated aortic root and ascending aorta. Aortic root diameter: 62 mm. Ascending aortic diameter: 56mm (S). - Mitral valve: Bioprosthetic mitral valve. No significant stenosis. No significant regurgitation. - Left atrium: The atrium was mildly dilated. - Right ventricle: The cavity size was  mildly to moderately dilated. Systolic function was normal. - Right atrium: The atrium was mildly dilated. - Pulmonary arteries: No complete TR doppler jet so unable to estimate PA systolic pressure. - Inferior vena cava: The vessel was normal in size; the respirophasic diameter changes were in the normal range (= 50%); findings are consistent with normal central venous pressure. Impressions:  -  Normal LV size and systolic function, EF 60-65%. Bioprosthetic aortic and mitral valves appeared to be functioning normally. Mild to moderate RV dilation with normal systolic function. Severe dilation of aortic root and ascending aorta.  ------------------------------------------------------------ Labs, prior tests, procedures, and surgery: Valve surgery. Mitral valve replacement with a bioprosthetic valve. Aortic valve replacement with a bioprosthetic valve.  Transthoracic echocardiography. M-mode, complete 2D, spectral Doppler, and color Doppler. Height: Height: 170.2cm. Height: 67in. Weight: Weight: 97.1kg. Weight: 213.6lb. Body mass index: BMI: 33.5kg/m^2. Body surface area: BSA: 2.40m^2. Blood pressure: 126/84. Patient status: Outpatient. Location: Echo laboratory.  ------------------------------------------------------------  ------------------------------------------------------------ Left ventricle: The cavity size was normal. Wall thickness was normal. Systolic function was normal. The estimated ejection fraction was in the range of 60% to 65%. Wall motion was normal; there were no regional wall motion abnormalities. Indeterminant diastolic function.  ------------------------------------------------------------ Aortic valve: Bioprosthetic aortic valve. No significant bioprosthetic valve stenosis. Doppler: No regurgitation. Mean gradient: 8mm Hg (S). Peak gradient: 14mm Hg (S).  ------------------------------------------------------------ Aorta: Severely dilated aortic root and ascending aorta. Aortic root diameter: 62 mm.  ------------------------------------------------------------ Mitral valve: Bioprosthetic mitral valve. No significant stenosis. Doppler: No significant regurgitation. Valve area by pressure half-time: 2.53cm^2. Indexed valve area by pressure half-time: 1.22cm^2/m^2. Peak gradient: 9mm  Hg (D).  ------------------------------------------------------------ Left atrium: The atrium was mildly dilated.  ------------------------------------------------------------ Right ventricle: The cavity size was mildly to moderately dilated. Systolic function was normal.  ------------------------------------------------------------ Pulmonic valve: Structurally normal valve. Cusp separation was normal. Doppler: Transvalvular velocity was within the normal range. No regurgitation.  ------------------------------------------------------------ Tricuspid valve: Doppler: No significant regurgitation.  ------------------------------------------------------------ Pulmonary artery: No complete TR doppler jet so unable to estimate PA systolic pressure.  ------------------------------------------------------------ Right atrium: The atrium was mildly dilated.  ------------------------------------------------------------ Pericardium: There was no pericardial effusion.  ------------------------------------------------------------ Systemic veins: Inferior vena cava: The vessel was normal in size; the respirophasic diameter changes were in the normal range (= 50%); findings are consistent with normal central venous pressure.  ------------------------------------------------------------ Post procedure conclusions Ascending Aorta:  - Severely dilated aortic root and ascending aorta. Aortic root diameter: 62 mm.  ------------------------------------------------------------  2D measurements Normal Doppler measurements Normal Left ventricle Left ventricle LVID ED, 42.5 mm 43-52 Ea, lat 5.66 cm/s ------ chord, ann, tiss PLAX DP LVID ES, 30 mm 23-38 E/Ea, lat 26.5 ------ chord, ann, tiss PLAX DP FS, chord, 29 % >29 Ea, med 5 cm/s ------ PLAX ann, tiss LVPW, ED 11.7 mm ------ DP IVS/LVPW 0.97 <1.3 E/Ea, med 30 ------ ratio, ED ann, tiss Ventricular septum DP IVS, ED 11.3 mm ------  Aortic valve Aorta Peak vel, 185 cm/s ------ AAo AP 56 mm ------ S diam, S Mean vel, 131 cm/s ------ Left atrium S AP dim 44 mm ------ VTI, S 47.6 cm ------ AP dim 2.12 cm/m^2 <2.2 Mean 8 mm Hg ------ index gradient, Vol, S 76.8 ml ------ S Vol index, 36.9 ml/m^2 ------ Peak 14 mm Hg ------ S gradient, S Mitral valve Peak E vel 150 cm/s ------ Peak A vel 114 cm/s ------ Decelerati 250 ms 150-23 on time 0 Pressure 87 ms ------ half-time Peak 9 mm Hg ------ gradient, D  Peak E/A 1.3 ------ ratio Area (PHT) 2.53 cm^2 ------ Area index 1.22 cm^2/m ------ (PHT) ^2 Systemic veins Estimated 10 mm Hg ------ CVP Right ventricle Sa vel, 12.1 cm/s ------ lat ann, tiss DP  ------------------------------------------------------------ Prepared and Electronically Authenticated by  Marca Ancona [id number]:[id number]   Cardiac Catheterization Procedure Note   Name: Thomas Henderson  MRN: [id number]  DOB: Aug 12, 1940  Procedure: Right Heart Cath, Left Heart Cath, Selective Coronary Angiography, aortic root angiogram  Indication: Preoperative cardiac cath for ascending thoracic aortic surgery  Procedural Details: The right groin was prepped, draped, and anesthetized with 1% lidocaine. Using the modified Seldinger technique a 5 French sheath was placed in the right femoral artery and a 7 French sheath was placed in the right femoral vein. A Swan-Ganz catheter was used for the right heart catheterization. Standard protocol was followed for recording of right heart pressures and sampling of oxygen saturations. Fick cardiac output was calculated. Standard Judkins catheters were used for selective coronary angiography and Aortic root angiography. A JL 6 catheter was used for imaging of the left coronary artery, a JR 4 catheter was used for the right coronary artery. There were no immediate procedural complications. The patient was transferred to the post catheterization recovery area for  further monitoring.  Procedural Findings:  Hemodynamics  RA 7  RV 29/7  PA 32/16 with a mean of 23  PCWP 15  LV not recorded  AO 115/77 with a mean of 94  Oxygen saturations:  PA 70  SVC 76  AO 95  Cardiac Output (Fick) 7.9  Cardiac Index (Fick) 3.8  Coronary angiography:  Coronary dominance: right  Left mainstem: Arises from the left cusp. The distal left main is dilated. There is no obstructive disease. There is swirling of contrast likely due to the large caliber of the left main.  Left anterior descending (LAD): The LAD is dilated throughout its proximal aspect with significant diffuse ectasia. The mid LAD after the first diagonal tapers to a normal caliber vessel without significant obstructive disease. However, there is diffuse irregularity throughout the coronaries suggestive of nonobstructive plaque. There are no significant stenoses throughout the distribution of the LAD.  Left circumflex (LCx): The left circumflex shows diffuse irregularity. The obtuse marginal branches are small without significant stenoses.  Right coronary artery (RCA): The RCA is dilated throughout its proximal and mid portions. At the junction of the mid and distal vessel, the RCA tapers into a normal caliber vessel. The distal RCA has diffuse irregularity extending into the PDA and PLA branches, both of which are small in caliber.  Left ventriculography: Deferred  Aortic root angiography: The aortic root and proximal and ascending aorta are markedly dilated. There is no aortic valve insufficiency identified.  Final Conclusions:  1. Markedly dilated aortic root and ascending thoracic aorta  2. Ectatic coronary arteries with diffuse nonobstructive CAD  3. Normal right heart hemodynamics  Tonny Bollman  10/13/2012, 3:14 PM       Impression:  Patient has large aneurysm involving the aortic root and proximal descending thoracic aorta having recently under gone aortic valve replacement using a  bioprosthetic tissue valve and mitral valve repair in 2008.  The maximum transverse diameter of the proximal aorta is now greater than 6 cm. Clinically the patient is doing very well with no symptoms of chest pain nor exertional shortness of breath. Followup echocardiogram demonstrates normal left ventricular function with normal functioning bioprosthetic tissue valve in the aortic position and intact mitral valve repair with  no mitral regurgitation. Left and right heart catheterization demonstrates nonobstructive coronary artery disease with normal right heart pressures.   Plan:  We plan to proceed with redo median sternotomy for redo aortic valve replacement with Bentall aortic root replacement and replacement of the ascending thoracic aortic aneurysm on Thursday, 10/30/2012. The patient's old bioprosthetic tissue valve will have to be replaced, and we will plan to use another bioprosthetic tissue valve within the valve conduit.  This will likely require right axillary artery cannulation to establish arterial access and it may require short duration of low flow antegrade cerebral perfusion partial circulatory arrest.  I've reviewed the indications, risks, and potential benefits of surgery with the patient again here in the office today.  The patient understands and accepts all potential associated risks of surgery including but not limited to risk of death, stroke, myocardial infarction, congestive heart failure, respiratory failure, renal failure, bleeding requiring blood transfusion and/or reexploration, arrhythmia, heart block or bradycardia requiring permanent pacemaker, pneumonia, pleural effusion, wound infection, pulmonary embolus or other thromboembolic complication, chronic pain, injury to the nerves innervating the vocal cords, injury to the nerves going to the right arm or other delayed complications.  All questions answered.    Salvatore Decent. Cornelius Moras, MD 10/16/2012 4:10 PM

## 2012-10-16 NOTE — Patient Instructions (Signed)
Stop taking Plaquenil  Continue all other medications without change through the day before surgery.  On the morning of surgery take only metoprolol (Toprol) with a sip of water

## 2012-10-17 ENCOUNTER — Other Ambulatory Visit: Payer: Self-pay | Admitting: *Deleted

## 2012-10-17 ENCOUNTER — Ambulatory Visit: Payer: Medicare Other | Admitting: Cardiovascular Disease

## 2012-10-17 DIAGNOSIS — I712 Thoracic aortic aneurysm, without rupture: Secondary | ICD-10-CM

## 2012-10-20 ENCOUNTER — Encounter (HOSPITAL_COMMUNITY): Payer: Self-pay | Admitting: Pharmacy Technician

## 2012-10-28 ENCOUNTER — Ambulatory Visit (HOSPITAL_COMMUNITY)
Admission: RE | Admit: 2012-10-28 | Discharge: 2012-10-28 | Disposition: A | Discharge status: Home / Self Care | Payer: Medicare Other | Source: Ambulatory Visit | Attending: Thoracic Surgery (Cardiothoracic Vascular Surgery) | Admitting: Thoracic Surgery (Cardiothoracic Vascular Surgery)

## 2012-10-28 ENCOUNTER — Encounter (HOSPITAL_COMMUNITY): Payer: Self-pay

## 2012-10-28 ENCOUNTER — Inpatient Hospital Stay (HOSPITAL_COMMUNITY)
Admission: RE | Admit: 2012-10-28 | Discharge: 2012-10-28 | Disposition: A | Discharge status: Home / Self Care | Payer: Medicare Other | Source: Ambulatory Visit | Attending: Thoracic Surgery (Cardiothoracic Vascular Surgery) | Admitting: Thoracic Surgery (Cardiothoracic Vascular Surgery)

## 2012-10-28 ENCOUNTER — Encounter (HOSPITAL_COMMUNITY)
Admission: RE | Admit: 2012-10-28 | Discharge: 2012-10-28 | Disposition: A | Discharge status: Home / Self Care | Payer: Medicare Other | Source: Ambulatory Visit | Attending: Thoracic Surgery (Cardiothoracic Vascular Surgery) | Admitting: Thoracic Surgery (Cardiothoracic Vascular Surgery)

## 2012-10-28 VITALS — BP 126/75 | HR 61 | Temp 97.5°F | Resp 18 | Ht 66.0 in | Wt 210.3 lb

## 2012-10-28 DIAGNOSIS — Z01811 Encounter for preprocedural respiratory examination: Secondary | ICD-10-CM | POA: Insufficient documentation

## 2012-10-28 DIAGNOSIS — Z01818 Encounter for other preprocedural examination: Secondary | ICD-10-CM | POA: Insufficient documentation

## 2012-10-28 DIAGNOSIS — Z01812 Encounter for preprocedural laboratory examination: Secondary | ICD-10-CM | POA: Insufficient documentation

## 2012-10-28 DIAGNOSIS — I712 Thoracic aortic aneurysm, without rupture, unspecified: Secondary | ICD-10-CM | POA: Insufficient documentation

## 2012-10-28 DIAGNOSIS — I359 Nonrheumatic aortic valve disorder, unspecified: Secondary | ICD-10-CM | POA: Insufficient documentation

## 2012-10-28 DIAGNOSIS — Z954 Presence of other heart-valve replacement: Secondary | ICD-10-CM | POA: Insufficient documentation

## 2012-10-28 DIAGNOSIS — Z01810 Encounter for preprocedural cardiovascular examination: Secondary | ICD-10-CM | POA: Insufficient documentation

## 2012-10-28 HISTORY — DX: Personal history of colonic polyps: Z86.010

## 2012-10-28 HISTORY — DX: Dorsalgia, unspecified: M54.9

## 2012-10-28 HISTORY — DX: Gastro-esophageal reflux disease without esophagitis: K21.9

## 2012-10-28 HISTORY — DX: Personal history of colon polyps, unspecified: Z86.0100

## 2012-10-28 HISTORY — DX: Dizziness and giddiness: R42

## 2012-10-28 HISTORY — DX: Unspecified osteoarthritis, unspecified site: M19.90

## 2012-10-28 LAB — BLOOD GAS, ARTERIAL
Bicarbonate: 21.7 mEq/L (ref 20.0–24.0)
Patient temperature: 98.6
TCO2: 22.7 mmol/L (ref 0–100)
pCO2 arterial: 34 mmHg — ABNORMAL LOW (ref 35.0–45.0)
pH, Arterial: 7.422 (ref 7.350–7.450)
pO2, Arterial: 98.9 mmHg (ref 80.0–100.0)

## 2012-10-28 LAB — COMPREHENSIVE METABOLIC PANEL
ALT: 18 U/L (ref 0–53)
Albumin: 3.9 g/dL (ref 3.5–5.2)
Alkaline Phosphatase: 60 U/L (ref 39–117)
BUN: 33 mg/dL — ABNORMAL HIGH (ref 6–23)
Chloride: 104 mEq/L (ref 96–112)
Glucose, Bld: 92 mg/dL (ref 70–99)
Potassium: 4 mEq/L (ref 3.5–5.1)
Sodium: 137 mEq/L (ref 135–145)
Total Bilirubin: 0.3 mg/dL (ref 0.3–1.2)
Total Protein: 7.8 g/dL (ref 6.0–8.3)

## 2012-10-28 LAB — CBC
HCT: 39.3 % (ref 39.0–52.0)
Hemoglobin: 13.2 g/dL (ref 13.0–17.0)
MCHC: 33.6 g/dL (ref 30.0–36.0)
WBC: 5.5 10*3/uL (ref 4.0–10.5)

## 2012-10-28 LAB — PROTIME-INR: INR: 0.97 (ref 0.00–1.49)

## 2012-10-28 LAB — URINALYSIS, ROUTINE W REFLEX MICROSCOPIC
Ketones, ur: NEGATIVE mg/dL
Protein, ur: NEGATIVE mg/dL
Specific Gravity, Urine: 1.022 (ref 1.005–1.030)

## 2012-10-28 LAB — PULMONARY FUNCTION TEST

## 2012-10-28 MED ORDER — CHLORHEXIDINE GLUCONATE 4 % EX LIQD: 30.0000 mL | CUTANEOUS | Status: DC

## 2012-10-28 NOTE — Pre-Procedure Instructions (Signed)
RAMESH MOAN  10/28/2012   Your procedure is scheduled on:  Thurs, July 31 @ 7:30 AM  Report to [facility] at 5:30 AM.  Call this number if you have problems the morning of surgery: [phone]   Remember:   Do not eat food or drink liquids after midnight.   Take these medicines the morning of surgery with A SIP OF WATER: Gabapentin(Neurontin),Metoprolol(Toprol),and Pantoprazole(Protonix)               No Goody's,BC's,Aleve,Ibuprofen,Fish Oil,or any Herbal Medications   Do not wear jewelry  Do not wear lotions, powders, or colognes. You may wear deodorant.  Men may shave face and neck.  Do not bring valuables to the hospital.  [facility] is not responsible                   for any belongings or valuables.  Contacts, dentures or bridgework may not be worn into surgery.  Leave suitcase in the car. After surgery it may be brought to your room.  For patients admitted to the hospital, checkout time is 11:00 AM the day of  discharge.     Special Instructions: Shower using CHG 2 nights before surgery and the night before surgery.  If you shower the day of surgery use CHG.  Use special wash - you have one bottle of CHG for all showers.  You should use approximately 1/3 of the bottle for each shower.   Please read over the following fact sheets that you were given: Pain Booklet, Coughing and Deep Breathing, Blood Transfusion Information, MRSA Information and Surgical Site Infection Prevention

## 2012-10-28 NOTE — Progress Notes (Signed)
PFT was done today at 1200  Dopplers were done @ 1100

## 2012-10-28 NOTE — Progress Notes (Signed)
Cardiologist is Dr.Nikesh Teschner with [facility]  Multiple echo reports in epic with most recent one 10/01/12  Stress test done around 2001  Heart cath report in epic from 10-13-12  Medical Md is Dr.Dana Thomasena Edis in [place]  EKG report in epic from 10-13-12  Denies CXR in past yr

## 2012-10-28 NOTE — Progress Notes (Signed)
VASCULAR LAB PRELIMINARY  PRELIMINARY  PRELIMINARY  PRELIMINARY  Pre-op Cardiac Surgery  Carotid Findings:  Bilateral:  Less than 40% ICA stenosis.  Vertebral artery flow is antegrade.     Upper Extremity Right Left  Brachial Pressures 148 Triphasic 112 Triphasic  Radial Waveforms Triphasic Triphasic  Ulnar Waveforms Triphasic Triphasic  Palmar Arch (Allen's Test) Abnormal Abnormal   Findings:  Palmar arch evaluation - Doppler waveforms diminished 50% with radial compression and reman normal with ulnar compressions bilaterally.   Atiyana Welte, RVS 10/28/2012, 6:20 PM

## 2012-10-29 MED ORDER — PHENYLEPHRINE HCL 10 MG/ML IJ SOLN
30.0000 ug/min | INTRAVENOUS | Status: DC
  Filled 2012-10-29: qty 2

## 2012-10-29 MED ORDER — VANCOMYCIN HCL 10 G IV SOLR
1500.0000 mg | INTRAVENOUS | Status: AC
  Administered 2012-10-30: 1500 mg via INTRAVENOUS
  Filled 2012-10-29: qty 1500

## 2012-10-29 MED ORDER — DOPAMINE-DEXTROSE 3.2-5 MG/ML-% IV SOLN
2.0000 ug/kg/min | INTRAVENOUS | Status: DC
  Filled 2012-10-29: qty 250

## 2012-10-29 MED ORDER — CEFUROXIME SODIUM 750 MG IJ SOLR
750.0000 mg | INTRAMUSCULAR | Status: DC
  Filled 2012-10-29: qty 750

## 2012-10-29 MED ORDER — MAGNESIUM SULFATE 50 % IJ SOLN
40.0000 meq | INTRAMUSCULAR | Status: DC
  Filled 2012-10-29: qty 10

## 2012-10-29 MED ORDER — METOPROLOL TARTRATE 12.5 MG HALF TABLET
12.5000 mg | ORAL_TABLET | Freq: Once | ORAL | Status: AC
  Administered 2012-10-30: 12.5 mg via ORAL
  Filled 2012-10-29: qty 1

## 2012-10-29 MED ORDER — PLASMA-LYTE 148 IV SOLN
INTRAVENOUS | Status: AC
  Administered 2012-10-30: 08:00:00
  Filled 2012-10-29: qty 2.5

## 2012-10-29 MED ORDER — DEXTROSE 5 % IV SOLN
1.5000 g | INTRAVENOUS | Status: AC
  Administered 2012-10-30: 1.5 g via INTRAVENOUS
  Administered 2012-10-30: .75 g via INTRAVENOUS
  Filled 2012-10-29 (×2): qty 1.5

## 2012-10-29 MED ORDER — NITROGLYCERIN IN D5W 200-5 MCG/ML-% IV SOLN
2.0000 ug/min | INTRAVENOUS | Status: DC
  Filled 2012-10-29: qty 250

## 2012-10-29 MED ORDER — POTASSIUM CHLORIDE 2 MEQ/ML IV SOLN
80.0000 meq | INTRAVENOUS | Status: DC
  Filled 2012-10-29: qty 40

## 2012-10-29 MED ORDER — VANCOMYCIN HCL 1000 MG IV SOLR
INTRAVENOUS | Status: AC
  Administered 2012-10-30: 08:00:00
  Filled 2012-10-29: qty 1000

## 2012-10-29 MED ORDER — SODIUM CHLORIDE 0.9 % IV SOLN
INTRAVENOUS | Status: AC
  Administered 2012-10-30: 1.8 [IU]/h via INTRAVENOUS
  Filled 2012-10-29: qty 1

## 2012-10-29 MED ORDER — SODIUM CHLORIDE 0.9 % IV SOLN
INTRAVENOUS | Status: AC
  Administered 2012-10-30: 12:00:00 via INTRAVENOUS
  Administered 2012-10-30: 69.8 mL/h via INTRAVENOUS
  Filled 2012-10-29: qty 40

## 2012-10-29 MED ORDER — SODIUM CHLORIDE 0.9 % IV SOLN
INTRAVENOUS | Status: DC
  Filled 2012-10-29: qty 30

## 2012-10-29 MED ORDER — DEXMEDETOMIDINE HCL IN NACL 400 MCG/100ML IV SOLN
0.1000 ug/kg/h | INTRAVENOUS | Status: AC
  Administered 2012-10-30: 0.3 ug/kg/h via INTRAVENOUS
  Administered 2012-10-30: 16:00:00 via INTRAVENOUS
  Filled 2012-10-29: qty 100

## 2012-10-29 MED ORDER — EPINEPHRINE HCL 1 MG/ML IJ SOLN
0.5000 ug/min | INTRAMUSCULAR | Status: DC
  Filled 2012-10-29: qty 4

## 2012-10-29 NOTE — H&P (Signed)
[address]       [name] [address]             [phone]          CARDIOTHORACIC SURGERY HISTORY AND PHYSICAL EXAM  PCP is [name], DANA, DO Referring Provider is Tonny Bollman, MD   Chief Complaint:  Aortic aneurysm  HPI:  Patient is a 72 yo male who returns for followup of ascending thoracic aortic aneurysm. He originally underwent aortic valve replacement using a bioprosthetic tissue valve and mitral valve repair in 2008. At that time his proximal ascending aorta was only mildly dilated. Followup echocardiograms suggested the interval increase in size of the patient's aortic root, which was confirmed on MRA of the thoracic aorta. I saw the patient in consultation on 09/07/2011 and have been following him closely ever since. Mr Toro has previously remained reluctant to consider elective surgical repair despite concerns regarding the risk of aortic dissection or rupture. However, his aneurysm has continued to enlarge over time and he now desires to proceed with elective repair.  He has remained clinically stable and he specifically denies any pain in his chest or back that might be any way related to his ascending thoracic aortic aneurysm. He reports stable symptoms of chronic exertional shortness of breath and fatigue. He continues to work and overall he is getting along fairly well.    Past Medical History  Diagnosis Date  . Mitral regurgitation     a. 2008 - MVR w/ 26mm annuloplasty ring;  b. 12/2007 Echo Triv MR  . Aortic insufficiency     a. 2008 - AVR w/ 25mm pericardial tissue valve;  a. 12/2007 Echo No AI.  Marland Kitchen Dyslipidemia     takes Simvastatin daily  . ED (erectile dysfunction)   . Aortic root dilatation     a. 12/2007 Echo: Ao Root 48mm  . S/P aortic valve replacement with bioprosthetic valve 08/13/2006    #55mm Edwards Magna pericardial tissue valve  . S/P mitral valve repair 08/13/2006    #3mm Edwards Physio ring annuloplasty  . Thoracic  ascending aortic aneurysm 09/07/2011  . HTN (hypertension)     takes Lisinopril and Metoprolol daily  . Intermittent lightheadedness   . Arthritis     legs  . Back pain     several back surgeries  . GERD (gastroesophageal reflux disease)     takes Protonix daily  . History of colon polyps   . CHF (congestive heart failure)     a. h/o severe LV dysfxn in setting of valvular dzs in 2008;  b. 12/2007 Echo: EF 55-60%, No RWMA, No AI, Mod Ao Root Dil, Triv MR/PR/TR, NL RV.    Past Surgical History  Procedure Laterality Date  . Leg surgery      left  . Eye surgery      left  . Tonsillectomy    . Lumbar laminectomy    . Lumbar disc surgery    . Aortic valve replacement  08/13/2006    #44mm [facility] pericardial tissue valve  . Mitral valve repair  08/13/2006    #59mm Edwards Physio ring annuloplasty  . Cardiac catheterization  07/31/06    10/13/12  . Colonoscopy      Family History  Problem Relation Age of Onset  . Cardiomyopathy    . Heart attack Brother 65  . Other Mother 4    Enlarged heart    Social History History  Substance Use  Topics  . Smoking status: Never Smoker   . Smokeless tobacco: Not on file  . Alcohol Use: No    Prior to Admission medications   Medication Sig Start Date End Date Taking? Authorizing Provider  aspirin 81 MG tablet Take 81 mg by mouth daily.     Yes Historical Provider, MD  gabapentin (NEURONTIN) 300 MG capsule Take 300 mg by mouth daily.  09/09/12  Yes Historical Provider, MD  hydrochlorothiazide 25 MG tablet Take 25 mg by mouth daily.     Yes Historical Provider, MD  lisinopril (PRINIVIL,ZESTRIL) 40 MG tablet Take 40 mg by mouth daily.     Yes Historical Provider, MD  metoprolol succinate (TOPROL-XL) 50 MG 24 hr tablet Take 25 mg by mouth daily. Take with or immediately following a meal.   Yes Historical Provider, MD  niacin 500 MG tablet Take 500 mg by mouth daily with breakfast.   Yes Historical Provider, MD  pantoprazole (PROTONIX) 40  MG tablet Take 40 mg by mouth daily.   Yes Historical Provider, MD  simvastatin (ZOCOR) 20 MG tablet Take 20 mg by mouth at bedtime.     Yes Historical Provider, MD    No Known Allergies  Review of Systems:  General:  normal appetite, normal energy   Respiratory:  no cough, no wheezing, no hemoptysis, no pain with inspiration or cough, no shortness of breath   Cardiac:   no chest pain or tightness, + mild exertional SOB, no resting SOB, no PND, no orthopnea, no LE edema, no palpitations, no syncope  GI:   no difficulty swallowing, no hematochezia, no hematemesis, no melena, no constipation, no diarrhea   GU:   no dysuria, no urgency, no frequency   Musculoskeletal: no arthritis, no arthralgia   Vascular:  no pain suggestive of claudication   Neuro:   no symptoms suggestive of TIA's, no seizures, no headaches, no peripheral neuropathy   Endocrine:  Negative   HEENT:  no loose teeth or painful teeth,  no recent vision changes  Psych:   no anxiety, no depression    Physical Exam:   Wt 95.397 kg (210 lb 5 oz)  BMI 33.96 kg/m2  General:    well-appearing  HEENT:  Unremarkable   Neck:   no JVD, no bruits, no adenopathy   Chest:   clear to auscultation, symmetrical breath sounds, no wheezes, no rhonchi  CV:   RRR, no murmur   Abdomen:  soft, non-tender, no masses   Extremities:  warm, well-perfused, pulses palpable  Rectal/GU  Deferred  Neuro:   Grossly non-focal and symmetrical throughout  Skin:   Clean and dry, no rashes, no breakdown  Diagnostic Tests:   CT ANGIOGRAPHY CHEST   Technique: Multidetector CT imaging of the chest using the  standard protocol during bolus administration of intravenous  contrast. Multiplanar reconstructed images including MIPs were  obtained and reviewed to evaluate the vascular anatomy.  Contrast: 80mL OMNIPAQUE IOHEXOL 350 MG/ML SOLN  Comparison: CTA of the thorax 06/13/2012.  Findings:  Mediastinum: Postoperative changes of median sternotomy for  aortic  valve replacement and mitral valve annuloplasty noted. There is  persistent aneurysmal dilatation of the aortic root. This was  measured on the double oblique images through the aortic root, with  estimated measurements on this non gated CT examination of 30 mm at  the annulus, the 58 mm at the sinuses of Valsalva, and  approximately 54 mm at the sinotubular junction. No evidence of  thoracic aortic dissection  at this time. The arch and descending  thoracic aorta are normal in caliber measuring 2.8 and 2.6 cm in  diameter respectively. Additionally, the ascending thoracic aorta  measures approximately 6.5 x 5.6 cm when measured orthogonal to the  direction of blood flow at the level of the pulmonic try Heart size  is normal. There is no significant pericardial fluid, thickening or  pericardial calcification. Multiple reactive size mediastinal lymph  nodes, similar to the prior examination, the largest of which  measures up to 1 cm in short axis in the low right paratracheal  region, presumably reactive. No definite pathologic mediastinal or  hilar lymphadenopathy. Small hiatal hernia.  Lungs/Pleura: No acute consolidative airspace disease. No pleural  effusions. No suspicious appearing pulmonary nodules or masses.  Multifocal areas of mild pleural thickening, presumably some mild  postoperative scarring. A small Bochdalek hernia in the medial  aspect of the right hemithorax incidentally noted.  Upper Abdomen: A small gallstone noted gallbladder. 4.1 cm simple  cyst in the upper pole of the right kidney again noted.  Musculoskeletal: Sternotomy wires. There are no aggressive  appearing lytic or blastic lesions noted in the visualized portions  of the skeleton.  IMPRESSION:  1. Aneurysmal dilatation of the aortic root and descending thoracic  aorta, as above. Direct comparison with prior examinations is  challenging on this non gated CT examination, however, the size of  the  aneurysm is favored to be unchanged.  2. Small hiatal hernia.  3. Cholelithiasis.  4. Additional incidental findings, as above.  Original Report Authenticated By: Trudie Reed, M.D.       Transthoracic Echocardiography  Patient: Larance, Ratledge MR #: [id number] Study Date: 10/01/2012 Gender: M Age: 38 Height: 170.2cm Weight: 97.1kg BSA: 2.80m^2 Pt. Status: Room:  PERFORMING [place], [facility] MD REFERRING Tressie Stalker MD ATTENDING Lovett Sox SONOGRAPHER [address], RDCS REFERRING Irena Reichmann cc:  ------------------------------------------------------------ LV EF: 60% - 65%  ------------------------------------------------------------ Indications: Aneurysm - thoracic 441.2. CHF - 428.0.  ------------------------------------------------------------ History: Risk factors: Hypertension. Dyslipidemia.  ------------------------------------------------------------ Study Conclusions  - Left ventricle: The cavity size was normal. Wall thickness was normal. Systolic function was normal. The estimated ejection fraction was in the range of 60% to 65%. Wall motion was normal; there were no regional wall motion abnormalities. Indeterminant diastolic function. - Aortic valve: Bioprosthetic aortic valve. No significant bioprosthetic valve stenosis. No regurgitation. Mean gradient: 8mm Hg (S). - Aorta: Severely dilated aortic root and ascending aorta. Aortic root diameter: 62 mm. Ascending aortic diameter: 56mm (S). - Mitral valve: Bioprosthetic mitral valve. No significant stenosis. No significant regurgitation. - Left atrium: The atrium was mildly dilated. - Right ventricle: The cavity size was mildly to moderately dilated. Systolic function was normal. - Right atrium: The atrium was mildly dilated. - Pulmonary arteries: No complete TR doppler jet so unable to estimate PA systolic pressure. - Inferior vena cava: The vessel was normal in  size; the respirophasic diameter changes were in the normal range (= 50%); findings are consistent with normal central venous pressure. Impressions:  - Normal LV size and systolic function, EF 60-65%. Bioprosthetic aortic and mitral valves appeared to be functioning normally. Mild to moderate RV dilation with normal systolic function. Severe dilation of aortic root and ascending aorta.  ------------------------------------------------------------ Labs, prior tests, procedures, and surgery: Valve surgery. Mitral valve replacement with a bioprosthetic valve. Aortic valve replacement with a bioprosthetic valve.  Transthoracic echocardiography. M-mode, complete 2D, spectral Doppler, and color Doppler. Height: Height: 170.2cm. Height:  67in. Weight: Weight: 97.1kg. Weight: 213.6lb. Body mass index: BMI: 33.5kg/m^2. Body surface area: BSA: 2.45m^2. Blood pressure: 126/84. Patient status: Outpatient. Location: Echo laboratory.  ------------------------------------------------------------  ------------------------------------------------------------ Left ventricle: The cavity size was normal. Wall thickness was normal. Systolic function was normal. The estimated ejection fraction was in the range of 60% to 65%. Wall motion was normal; there were no regional wall motion abnormalities. Indeterminant diastolic function.  ------------------------------------------------------------ Aortic valve: Bioprosthetic aortic valve. No significant bioprosthetic valve stenosis. Doppler: No regurgitation. Mean gradient: 8mm Hg (S). Peak gradient: 14mm Hg (S).  ------------------------------------------------------------ Aorta: Severely dilated aortic root and ascending aorta. Aortic root diameter: 62 mm.  ------------------------------------------------------------ Mitral valve: Bioprosthetic mitral valve. No significant stenosis. Doppler: No significant regurgitation. Valve area by pressure  half-time: 2.53cm^2. Indexed valve area by pressure half-time: 1.22cm^2/m^2. Peak gradient: 9mm Hg (D).  ------------------------------------------------------------ Left atrium: The atrium was mildly dilated.  ------------------------------------------------------------ Right ventricle: The cavity size was mildly to moderately dilated. Systolic function was normal.  ------------------------------------------------------------ Pulmonic valve: Structurally normal valve. Cusp separation was normal. Doppler: Transvalvular velocity was within the normal range. No regurgitation.  ------------------------------------------------------------ Tricuspid valve: Doppler: No significant regurgitation.  ------------------------------------------------------------ Pulmonary artery: No complete TR doppler jet so unable to estimate PA systolic pressure.  ------------------------------------------------------------ Right atrium: The atrium was mildly dilated.  ------------------------------------------------------------ Pericardium: There was no pericardial effusion.  ------------------------------------------------------------ Systemic veins: Inferior vena cava: The vessel was normal in size; the respirophasic diameter changes were in the normal range (= 50%); findings are consistent with normal central venous pressure.  ------------------------------------------------------------ Post procedure conclusions Ascending Aorta:  - Severely dilated aortic root and ascending aorta. Aortic root diameter: 62 mm.  ------------------------------------------------------------  2D measurements Normal Doppler measurements Normal Left ventricle Left ventricle LVID ED, 42.5 mm 43-52 Ea, lat 5.66 cm/s ------ chord, ann, tiss PLAX DP LVID ES, 30 mm 23-38 E/Ea, lat 26.5 ------ chord, ann, tiss PLAX DP FS, chord, 29 % >29 Ea, med 5 cm/s ------ PLAX ann, tiss LVPW, ED 11.7 mm ------ DP IVS/LVPW  0.97 <1.3 E/Ea, med 30 ------ ratio, ED ann, tiss Ventricular septum DP IVS, ED 11.3 mm ------ Aortic valve Aorta Peak vel, 185 cm/s ------ AAo AP 56 mm ------ S diam, S Mean vel, 131 cm/s ------ Left atrium S AP dim 44 mm ------ VTI, S 47.6 cm ------ AP dim 2.12 cm/m^2 <2.2 Mean 8 mm Hg ------ index gradient, Vol, S 76.8 ml ------ S Vol index, 36.9 ml/m^2 ------ Peak 14 mm Hg ------ S gradient, S Mitral valve Peak E vel 150 cm/s ------ Peak A vel 114 cm/s ------ Decelerati 250 ms 150-23 on time 0 Pressure 87 ms ------ half-time Peak 9 mm Hg ------ gradient, D Peak E/A 1.3 ------ ratio Area (PHT) 2.53 cm^2 ------ Area index 1.22 cm^2/m ------ (PHT) ^2 Systemic veins Estimated 10 mm Hg ------ CVP Right ventricle Sa vel, 12.1 cm/s ------ lat ann, tiss DP  ------------------------------------------------------------ Prepared and Electronically Authenticated by  Marca Ancona [id number]:[id number]    Cardiac Catheterization Procedure Note   Name: OJAS COONE  MRN: [id number]  DOB: 1941/02/19  Procedure: Right Heart Cath, Left Heart Cath, Selective Coronary Angiography, aortic root angiogram  Indication: Preoperative cardiac cath for ascending thoracic aortic surgery  Procedural Details: The right groin was prepped, draped, and anesthetized with 1% lidocaine. Using the modified Seldinger technique a 5 French sheath was placed in the right femoral artery and a 7 French sheath was placed in the right femoral vein. A Swan-Ganz catheter was used for  the right heart catheterization. Standard protocol was followed for recording of right heart pressures and sampling of oxygen saturations. Fick cardiac output was calculated. Standard Judkins catheters were used for selective coronary angiography and Aortic root angiography. A JL 6 catheter was used for imaging of the left coronary artery, a JR 4 catheter was used for the right coronary artery. There were no immediate  procedural complications. The patient was transferred to the post catheterization recovery area for further monitoring.  Procedural Findings:  Hemodynamics  RA 7  RV 29/7  PA 32/16 with a mean of 23  PCWP 15  LV not recorded  AO 115/77 with a mean of 94  Oxygen saturations:  PA 70  SVC 76  AO 95  Cardiac Output (Fick) 7.9  Cardiac Index (Fick) 3.8  Coronary angiography:  Coronary dominance: right  Left mainstem: Arises from the left cusp. The distal left main is dilated. There is no obstructive disease. There is swirling of contrast likely due to the large caliber of the left main.  Left anterior descending (LAD): The LAD is dilated throughout its proximal aspect with significant diffuse ectasia. The mid LAD after the first diagonal tapers to a normal caliber vessel without significant obstructive disease. However, there is diffuse irregularity throughout the coronaries suggestive of nonobstructive plaque. There are no significant stenoses throughout the distribution of the LAD.  Left circumflex (LCx): The left circumflex shows diffuse irregularity. The obtuse marginal branches are small without significant stenoses.  Right coronary artery (RCA): The RCA is dilated throughout its proximal and mid portions. At the junction of the mid and distal vessel, the RCA tapers into a normal caliber vessel. The distal RCA has diffuse irregularity extending into the PDA and PLA branches, both of which are small in caliber.  Left ventriculography: Deferred  Aortic root angiography: The aortic root and proximal and ascending aorta are markedly dilated. There is no aortic valve insufficiency identified.  Final Conclusions:  1. Markedly dilated aortic root and ascending thoracic aorta  2. Ectatic coronary arteries with diffuse nonobstructive CAD  3. Normal right heart hemodynamics  Tonny Bollman  10/13/2012, 3:14 PM        Impression:  Patient has large aneurysm involving the aortic root and  proximal ascending thoracic aorta having previously under gone aortic valve replacement using a bioprosthetic tissue valve and mitral valve repair in 2008.  The maximum transverse diameter of the proximal aorta is now greater than 6 cm. Clinically the patient is doing very well with no symptoms of chest pain nor exertional shortness of breath. Followup echocardiogram demonstrates normal left ventricular function with normal functioning bioprosthetic tissue valve in the aortic position and intact mitral valve repair with no mitral regurgitation. Left and right heart catheterization demonstrates nonobstructive coronary artery disease with normal right heart pressures.   Plan:  We plan to proceed with redo median sternotomy for redo aortic valve replacement with Bentall aortic root replacement and replacement of the ascending thoracic aortic aneurysm on Thursday, 10/30/2012. The patient's old bioprosthetic tissue valve will have to be replaced, and we will plan to use another bioprosthetic tissue valve within the valve conduit.  This will likely require right axillary artery cannulation to establish arterial access and it may require short duration of low flow antegrade cerebral perfusion partial circulatory arrest.  I've reviewed the indications, risks, and potential benefits of surgery with the patient again here in the office today.  The patient understands and accepts all potential associated risks of surgery  including but not limited to risk of death, stroke, myocardial infarction, congestive heart failure, respiratory failure, renal failure, bleeding requiring blood transfusion and/or reexploration, arrhythmia, heart block or bradycardia requiring permanent pacemaker, pneumonia, pleural effusion, wound infection, pulmonary embolus or other thromboembolic complication, chronic pain, injury to the nerves innervating the vocal cords, injury to the nerves going to the right arm or other delayed complications.   All questions answered.    Salvatore Decent. Cornelius Moras, MD 10/16/2012 4:10 PM

## 2012-10-30 ENCOUNTER — Encounter (HOSPITAL_COMMUNITY): Payer: Self-pay | Admitting: Certified Registered"

## 2012-10-30 ENCOUNTER — Inpatient Hospital Stay (HOSPITAL_COMMUNITY): Payer: Medicare Other | Admitting: Certified Registered"

## 2012-10-30 ENCOUNTER — Inpatient Hospital Stay (HOSPITAL_COMMUNITY): Payer: Medicare Other

## 2012-10-30 ENCOUNTER — Inpatient Hospital Stay (HOSPITAL_COMMUNITY)
Admission: RE | Admit: 2012-10-30 | Discharge: 2012-11-10 | DRG: 220 | Disposition: A | Discharge status: Home / Self Care | Payer: Medicare Other | Source: Ambulatory Visit | Attending: Thoracic Surgery (Cardiothoracic Vascular Surgery) | Admitting: Thoracic Surgery (Cardiothoracic Vascular Surgery)

## 2012-10-30 ENCOUNTER — Encounter (HOSPITAL_COMMUNITY)
Admission: RE | Disposition: A | Discharge status: Home / Self Care | Payer: Self-pay | Source: Ambulatory Visit | Attending: Thoracic Surgery (Cardiothoracic Vascular Surgery)

## 2012-10-30 ENCOUNTER — Encounter (HOSPITAL_COMMUNITY): Payer: Self-pay | Admitting: Surgery

## 2012-10-30 DIAGNOSIS — S8290XS Unspecified fracture of unspecified lower leg, sequela: Secondary | ICD-10-CM

## 2012-10-30 DIAGNOSIS — I10 Essential (primary) hypertension: Secondary | ICD-10-CM | POA: Diagnosis present

## 2012-10-30 DIAGNOSIS — S82142Q Displaced bicondylar fracture of left tibia, subsequent encounter for open fracture type I or II with malunion: Secondary | ICD-10-CM | POA: Diagnosis present

## 2012-10-30 DIAGNOSIS — G8929 Other chronic pain: Secondary | ICD-10-CM | POA: Diagnosis present

## 2012-10-30 DIAGNOSIS — J9819 Other pulmonary collapse: Secondary | ICD-10-CM | POA: Diagnosis not present

## 2012-10-30 DIAGNOSIS — I359 Nonrheumatic aortic valve disorder, unspecified: Secondary | ICD-10-CM

## 2012-10-30 DIAGNOSIS — Z79899 Other long term (current) drug therapy: Secondary | ICD-10-CM

## 2012-10-30 DIAGNOSIS — D6959 Other secondary thrombocytopenia: Secondary | ICD-10-CM | POA: Diagnosis not present

## 2012-10-30 DIAGNOSIS — IMO0002 Reserved for concepts with insufficient information to code with codable children: Secondary | ICD-10-CM | POA: Diagnosis present

## 2012-10-30 DIAGNOSIS — Q2544 Congenital dilation of aorta: Secondary | ICD-10-CM

## 2012-10-30 DIAGNOSIS — E8779 Other fluid overload: Secondary | ICD-10-CM | POA: Diagnosis not present

## 2012-10-30 DIAGNOSIS — I712 Thoracic aortic aneurysm, without rupture, unspecified: Principal | ICD-10-CM | POA: Diagnosis present

## 2012-10-30 DIAGNOSIS — M11262 Other chondrocalcinosis, left knee: Secondary | ICD-10-CM

## 2012-10-30 DIAGNOSIS — M1710 Unilateral primary osteoarthritis, unspecified knee: Secondary | ICD-10-CM | POA: Diagnosis present

## 2012-10-30 DIAGNOSIS — Z860100 Personal history of colon polyps, unspecified: Secondary | ICD-10-CM

## 2012-10-30 DIAGNOSIS — Z952 Presence of prosthetic heart valve: Secondary | ICD-10-CM

## 2012-10-30 DIAGNOSIS — E785 Hyperlipidemia, unspecified: Secondary | ICD-10-CM | POA: Diagnosis present

## 2012-10-30 DIAGNOSIS — Z8679 Personal history of other diseases of the circulatory system: Secondary | ICD-10-CM

## 2012-10-30 DIAGNOSIS — I509 Heart failure, unspecified: Secondary | ICD-10-CM | POA: Diagnosis present

## 2012-10-30 DIAGNOSIS — Z7982 Long term (current) use of aspirin: Secondary | ICD-10-CM

## 2012-10-30 DIAGNOSIS — I2510 Atherosclerotic heart disease of native coronary artery without angina pectoris: Secondary | ICD-10-CM | POA: Diagnosis present

## 2012-10-30 DIAGNOSIS — N529 Male erectile dysfunction, unspecified: Secondary | ICD-10-CM | POA: Diagnosis present

## 2012-10-30 DIAGNOSIS — D62 Acute posthemorrhagic anemia: Secondary | ICD-10-CM | POA: Diagnosis not present

## 2012-10-30 DIAGNOSIS — K219 Gastro-esophageal reflux disease without esophagitis: Secondary | ICD-10-CM | POA: Diagnosis present

## 2012-10-30 DIAGNOSIS — K449 Diaphragmatic hernia without obstruction or gangrene: Secondary | ICD-10-CM | POA: Diagnosis present

## 2012-10-30 DIAGNOSIS — I498 Other specified cardiac arrhythmias: Secondary | ICD-10-CM | POA: Diagnosis not present

## 2012-10-30 DIAGNOSIS — Z8249 Family history of ischemic heart disease and other diseases of the circulatory system: Secondary | ICD-10-CM

## 2012-10-30 DIAGNOSIS — M11869 Other specified crystal arthropathies, unspecified knee: Secondary | ICD-10-CM | POA: Diagnosis present

## 2012-10-30 DIAGNOSIS — M1712 Unilateral primary osteoarthritis, left knee: Secondary | ICD-10-CM | POA: Diagnosis present

## 2012-10-30 DIAGNOSIS — Z9889 Other specified postprocedural states: Secondary | ICD-10-CM

## 2012-10-30 HISTORY — DX: Displaced bicondylar fracture of left tibia, subsequent encounter for open fracture type I or II with malunion: S82.142Q

## 2012-10-30 HISTORY — DX: Personal history of other diseases of the circulatory system: Z86.79

## 2012-10-30 HISTORY — PX: REPLACEMENT ASCENDING AORTA: SHX6068

## 2012-10-30 HISTORY — DX: Other chondrocalcinosis, left knee: M11.262

## 2012-10-30 HISTORY — DX: Unilateral primary osteoarthritis, left knee: M17.12

## 2012-10-30 HISTORY — PX: AORTIC VALVE REPLACEMENT: SHX41

## 2012-10-30 HISTORY — DX: Other specified postprocedural states: Z98.890

## 2012-10-30 HISTORY — PX: BENTALL PROCEDURE: SHX5058

## 2012-10-30 LAB — POCT I-STAT 4, (NA,K, GLUC, HGB,HCT)
Glucose, Bld: 119 mg/dL — ABNORMAL HIGH (ref 70–99)
Glucose, Bld: 114 mg/dL — ABNORMAL HIGH (ref 70–99)
Glucose, Bld: 121 mg/dL — ABNORMAL HIGH (ref 70–99)
Glucose, Bld: 126 mg/dL — ABNORMAL HIGH (ref 70–99)
HCT: 35 % — ABNORMAL LOW (ref 39.0–52.0)
HCT: 25 % — ABNORMAL LOW (ref 39.0–52.0)
HCT: 24 % — ABNORMAL LOW (ref 39.0–52.0)
HCT: 25 % — ABNORMAL LOW (ref 39.0–52.0)
Hemoglobin: 11.9 g/dL — ABNORMAL LOW (ref 13.0–17.0)
Hemoglobin: 8.8 g/dL — ABNORMAL LOW (ref 13.0–17.0)
Hemoglobin: 8.5 g/dL — ABNORMAL LOW (ref 13.0–17.0)
Hemoglobin: 8.2 g/dL — ABNORMAL LOW (ref 13.0–17.0)
Hemoglobin: 8.5 g/dL — ABNORMAL LOW (ref 13.0–17.0)
Hemoglobin: 7.1 g/dL — ABNORMAL LOW (ref 13.0–17.0)
Potassium: 4.1 mEq/L (ref 3.5–5.1)
Potassium: 4.4 mEq/L (ref 3.5–5.1)
Potassium: 5.9 mEq/L — ABNORMAL HIGH (ref 3.5–5.1)
Sodium: 140 mEq/L (ref 135–145)
Sodium: 141 mEq/L (ref 135–145)
Sodium: 136 mEq/L (ref 135–145)
Sodium: 136 mEq/L (ref 135–145)
Sodium: 139 mEq/L (ref 135–145)

## 2012-10-30 LAB — POCT I-STAT 3, ART BLOOD GAS (G3+)
Acid-base deficit: 2 mmol/L (ref 0.0–2.0)
Bicarbonate: 22.4 mEq/L (ref 20.0–24.0)
Bicarbonate: 25.9 mEq/L — ABNORMAL HIGH (ref 20.0–24.0)
O2 Saturation: 100 %
Patient temperature: 34.6
TCO2: 27 mmol/L (ref 0–100)
pCO2 arterial: 34.7 mmHg — ABNORMAL LOW (ref 35.0–45.0)
pCO2 arterial: 36.9 mmHg (ref 35.0–45.0)
pH, Arterial: 7.444 (ref 7.350–7.450)
pO2, Arterial: 432 mmHg — ABNORMAL HIGH (ref 80.0–100.0)

## 2012-10-30 LAB — CBC
HCT: 21.1 % — ABNORMAL LOW (ref 39.0–52.0)
MCH: 31.5 pg (ref 26.0–34.0)
MCHC: 35.5 g/dL (ref 30.0–36.0)
MCV: 89 fL (ref 78.0–100.0)
MCV: 88.7 fL (ref 78.0–100.0)
Platelets: 115 10*3/uL — ABNORMAL LOW (ref 150–400)
Platelets: 133 10*3/uL — ABNORMAL LOW (ref 150–400)
RBC: 2.92 MIL/uL — ABNORMAL LOW (ref 4.22–5.81)
RDW: 14 % (ref 11.5–15.5)
RDW: 14.2 % (ref 11.5–15.5)
WBC: 6.8 10*3/uL (ref 4.0–10.5)

## 2012-10-30 LAB — PROTIME-INR: INR: 1.64 — ABNORMAL HIGH (ref 0.00–1.49)

## 2012-10-30 LAB — PREPARE RBC (CROSSMATCH)

## 2012-10-30 LAB — HEMOGLOBIN AND HEMATOCRIT, BLOOD: HCT: 22.3 % — ABNORMAL LOW (ref 39.0–52.0)

## 2012-10-30 LAB — PLATELET COUNT: Platelets: 100 10*3/uL — ABNORMAL LOW (ref 150–400)

## 2012-10-30 SURGERY — REDO STERNOTOMY
Anesthesia: General | Site: Chest | Wound class: Clean

## 2012-10-30 MED ORDER — PHENYLEPHRINE HCL 10 MG/ML IJ SOLN
0.0000 ug/min | INTRAVENOUS | Status: DC
  Administered 2012-11-01: 5 ug/min via INTRAVENOUS
  Filled 2012-10-30: qty 2

## 2012-10-30 MED ORDER — HEPARIN SODIUM (PORCINE) 1000 UNIT/ML IJ SOLN
INTRAMUSCULAR | Status: DC | PRN
  Administered 2012-10-30: 45000 [IU] via INTRAVENOUS

## 2012-10-30 MED ORDER — ACETAMINOPHEN 160 MG/5ML PO SOLN
1000.0000 mg | Freq: Four times a day (QID) | ORAL | Status: DC
  Administered 2012-10-31: 1000 mg
  Filled 2012-10-30: qty 40.6

## 2012-10-30 MED ORDER — HEMOSTATIC AGENTS (NO CHARGE) OPTIME
TOPICAL | Status: DC | PRN
  Administered 2012-10-30: 1 via TOPICAL

## 2012-10-30 MED ORDER — ACETAMINOPHEN 160 MG/5ML PO SOLN
650.0000 mg | Freq: Once | ORAL | Status: AC
  Administered 2012-10-30: 650 mg

## 2012-10-30 MED ORDER — METOPROLOL TARTRATE 1 MG/ML IV SOLN
2.5000 mg | INTRAVENOUS | Status: DC | PRN
  Administered 2012-10-30: 2.5 mg via INTRAVENOUS

## 2012-10-30 MED ORDER — LACTATED RINGERS IV SOLN
INTRAVENOUS | Status: DC | PRN
  Administered 2012-10-30: 07:00:00 via INTRAVENOUS

## 2012-10-30 MED ORDER — ACETAMINOPHEN 650 MG RE SUPP: 650.0000 mg | Freq: Once | RECTAL | Status: AC

## 2012-10-30 MED ORDER — DEXMEDETOMIDINE HCL IN NACL 200 MCG/50ML IV SOLN
0.1000 ug/kg/h | INTRAVENOUS | Status: DC
  Administered 2012-10-30 – 2012-10-31 (×6): 0.7 ug/kg/h via INTRAVENOUS
  Filled 2012-10-30 (×5): qty 50

## 2012-10-30 MED ORDER — THROMBIN 20000 UNITS EX SOLR
OROMUCOSAL | Status: DC | PRN
  Administered 2012-10-30 (×4): via TOPICAL

## 2012-10-30 MED ORDER — MIDAZOLAM HCL 5 MG/5ML IJ SOLN
INTRAMUSCULAR | Status: DC | PRN
  Administered 2012-10-30: 5 mg via INTRAVENOUS
  Administered 2012-10-30 (×5): 2 mg via INTRAVENOUS
  Administered 2012-10-30: 1 mg via INTRAVENOUS
  Administered 2012-10-30 (×2): 2 mg via INTRAVENOUS

## 2012-10-30 MED ORDER — POTASSIUM CHLORIDE 10 MEQ/50ML IV SOLN
10.0000 meq | INTRAVENOUS | Status: AC
  Administered 2012-10-30 (×3): 10 meq via INTRAVENOUS

## 2012-10-30 MED ORDER — LIDOCAINE HCL (CARDIAC) 20 MG/ML IV SOLN
INTRAVENOUS | Status: DC | PRN
  Administered 2012-10-30: 50 mg via INTRAVENOUS

## 2012-10-30 MED ORDER — SODIUM CHLORIDE 0.9 % IJ SOLN
OROMUCOSAL | Status: DC | PRN
  Administered 2012-10-30 (×5): via TOPICAL

## 2012-10-30 MED ORDER — SODIUM CHLORIDE 0.9 % IV SOLN: 250.0000 mL | INTRAVENOUS | Status: DC

## 2012-10-30 MED ORDER — LACTATED RINGERS IV SOLN: 500.0000 mL | Freq: Once | INTRAVENOUS | Status: AC | PRN

## 2012-10-30 MED ORDER — VANCOMYCIN HCL IN DEXTROSE 1-5 GM/200ML-% IV SOLN
1000.0000 mg | Freq: Once | INTRAVENOUS | Status: AC
  Administered 2012-10-31: 1000 mg via INTRAVENOUS
  Filled 2012-10-30: qty 200

## 2012-10-30 MED ORDER — THROMBIN 20000 UNITS EX SOLR
CUTANEOUS | Status: AC
  Filled 2012-10-30: qty 20000

## 2012-10-30 MED ORDER — NITROGLYCERIN IN D5W 200-5 MCG/ML-% IV SOLN: 0.0000 ug/min | INTRAVENOUS | Status: DC

## 2012-10-30 MED ORDER — ASPIRIN 81 MG PO CHEW: 324.0000 mg | CHEWABLE_TABLET | Freq: Every day | ORAL | Status: DC

## 2012-10-30 MED ORDER — LACTATED RINGERS IV SOLN
INTRAVENOUS | Status: DC
  Administered 2012-10-30 – 2012-11-01 (×2): via INTRAVENOUS

## 2012-10-30 MED ORDER — SODIUM CHLORIDE 0.9 % IR SOLN
Status: DC | PRN
  Administered 2012-10-30: 3000 mL

## 2012-10-30 MED ORDER — PANTOPRAZOLE SODIUM 40 MG PO TBEC
40.0000 mg | DELAYED_RELEASE_TABLET | Freq: Every day | ORAL | Status: DC
  Administered 2012-11-01 – 2012-11-02 (×2): 40 mg via ORAL
  Filled 2012-10-30 (×2): qty 1

## 2012-10-30 MED ORDER — ARTIFICIAL TEARS OP OINT
TOPICAL_OINTMENT | OPHTHALMIC | Status: DC | PRN
  Administered 2012-10-30: 1 via OPHTHALMIC

## 2012-10-30 MED ORDER — SODIUM CHLORIDE 0.9 % IR SOLN
Status: DC | PRN
  Administered 2012-10-30: 5000 mL

## 2012-10-30 MED ORDER — BISACODYL 5 MG PO TBEC
10.0000 mg | DELAYED_RELEASE_TABLET | Freq: Every day | ORAL | Status: DC
  Administered 2012-10-31 – 2012-11-02 (×3): 10 mg via ORAL
  Filled 2012-10-30 (×3): qty 2

## 2012-10-30 MED ORDER — SODIUM CHLORIDE 0.9 % IJ SOLN
3.0000 mL | Freq: Two times a day (BID) | INTRAMUSCULAR | Status: DC
  Administered 2012-10-31: 3 mL via INTRAVENOUS
  Administered 2012-11-01: 6 mL via INTRAVENOUS
  Administered 2012-11-01 – 2012-11-02 (×3): 3 mL via INTRAVENOUS

## 2012-10-30 MED ORDER — COAGULATION FACTOR VIIA RECOMB 1 MG IV SOLR
90.0000 ug/kg | INTRAVENOUS | Status: AC
  Administered 2012-10-30: 9000 ug via INTRAVENOUS
  Filled 2012-10-30: qty 9

## 2012-10-30 MED ORDER — DEXTROSE 5 % IV SOLN
1.5000 g | Freq: Two times a day (BID) | INTRAVENOUS | Status: AC
  Administered 2012-10-30 – 2012-11-01 (×4): 1.5 g via INTRAVENOUS
  Filled 2012-10-30 (×4): qty 1.5

## 2012-10-30 MED ORDER — SODIUM CHLORIDE 0.9 % IV SOLN
INTRAVENOUS | Status: DC
  Administered 2012-10-30: 17:00:00 via INTRAVENOUS

## 2012-10-30 MED ORDER — MAGNESIUM SULFATE 40 MG/ML IJ SOLN
4.0000 g | Freq: Once | INTRAMUSCULAR | Status: AC
  Administered 2012-10-30: 4 g via INTRAVENOUS
  Filled 2012-10-30: qty 100

## 2012-10-30 MED ORDER — SUCCINYLCHOLINE CHLORIDE 20 MG/ML IJ SOLN
INTRAMUSCULAR | Status: DC | PRN
  Administered 2012-10-30: 160 mg via INTRAVENOUS

## 2012-10-30 MED ORDER — SODIUM CHLORIDE 0.9 % IV SOLN
INTRAVENOUS | Status: DC
  Administered 2012-10-30: 0.7 [IU]/h via INTRAVENOUS
  Filled 2012-10-30: qty 1

## 2012-10-30 MED ORDER — METOPROLOL TARTRATE 12.5 MG HALF TABLET
12.5000 mg | ORAL_TABLET | Freq: Two times a day (BID) | ORAL | Status: DC
  Filled 2012-10-30 (×3): qty 1

## 2012-10-30 MED ORDER — NITROPRUSSIDE SODIUM 25 MG/ML IV SOLN
0.2500 ug/kg/min | INTRAVENOUS | Status: DC
  Filled 2012-10-30: qty 2

## 2012-10-30 MED ORDER — ALBUMIN HUMAN 5 % IV SOLN
INTRAVENOUS | Status: DC | PRN
  Administered 2012-10-30 (×2): via INTRAVENOUS

## 2012-10-30 MED ORDER — VECURONIUM BROMIDE 10 MG IV SOLR
INTRAVENOUS | Status: DC | PRN
  Administered 2012-10-30: 4 mg via INTRAVENOUS
  Administered 2012-10-30: 10 mg via INTRAVENOUS
  Administered 2012-10-30: 4 mg via INTRAVENOUS
  Administered 2012-10-30: 10 mg via INTRAVENOUS
  Administered 2012-10-30: 5 mg via INTRAVENOUS
  Administered 2012-10-30: 2 mg via INTRAVENOUS

## 2012-10-30 MED ORDER — METOPROLOL SUCCINATE ER 25 MG PO TB24
25.0000 mg | ORAL_TABLET | ORAL | Status: DC
  Filled 2012-10-30: qty 1

## 2012-10-30 MED ORDER — MORPHINE SULFATE 2 MG/ML IJ SOLN: 1.0000 mg | INTRAMUSCULAR | Status: AC | PRN

## 2012-10-30 MED ORDER — SODIUM CHLORIDE 0.9 % IJ SOLN
3.0000 mL | INTRAMUSCULAR | Status: DC | PRN
  Administered 2012-10-31 – 2012-11-02 (×3): 3 mL via INTRAVENOUS

## 2012-10-30 MED ORDER — METOPROLOL TARTRATE 25 MG/10 ML ORAL SUSPENSION
12.5000 mg | Freq: Two times a day (BID) | ORAL | Status: DC
  Filled 2012-10-30 (×3): qty 5

## 2012-10-30 MED ORDER — ASPIRIN EC 325 MG PO TBEC
325.0000 mg | DELAYED_RELEASE_TABLET | Freq: Every day | ORAL | Status: DC
  Administered 2012-10-31 – 2012-11-02 (×3): 325 mg via ORAL
  Filled 2012-10-30 (×4): qty 1

## 2012-10-30 MED ORDER — ALBUTEROL SULFATE HFA 108 (90 BASE) MCG/ACT IN AERS
INHALATION_SPRAY | RESPIRATORY_TRACT | Status: DC | PRN
  Administered 2012-10-30: 2 via RESPIRATORY_TRACT

## 2012-10-30 MED ORDER — FENTANYL CITRATE 0.05 MG/ML IJ SOLN
INTRAMUSCULAR | Status: DC | PRN
  Administered 2012-10-30 (×2): 100 ug via INTRAVENOUS
  Administered 2012-10-30: 50 ug via INTRAVENOUS
  Administered 2012-10-30: 250 ug via INTRAVENOUS
  Administered 2012-10-30: 150 ug via INTRAVENOUS
  Administered 2012-10-30: 100 ug via INTRAVENOUS
  Administered 2012-10-30 (×2): 50 ug via INTRAVENOUS
  Administered 2012-10-30: 500 ug via INTRAVENOUS
  Administered 2012-10-30: 50 ug via INTRAVENOUS
  Administered 2012-10-30: 100 ug via INTRAVENOUS

## 2012-10-30 MED ORDER — ACETAMINOPHEN 500 MG PO TABS
1000.0000 mg | ORAL_TABLET | Freq: Four times a day (QID) | ORAL | Status: DC
  Administered 2012-10-31 – 2012-11-03 (×11): 1000 mg via ORAL
  Filled 2012-10-30 (×16): qty 2

## 2012-10-30 MED ORDER — SODIUM CHLORIDE 0.9 % IV SOLN
1.0000 g/h | INTRAVENOUS | Status: DC
  Filled 2012-10-30: qty 20

## 2012-10-30 MED ORDER — SODIUM CHLORIDE 0.45 % IV SOLN
INTRAVENOUS | Status: DC
  Administered 2012-10-30: 20 mL/h via INTRAVENOUS
  Administered 2012-10-30: 17:00:00 via INTRAVENOUS

## 2012-10-30 MED ORDER — PROPOFOL 10 MG/ML IV BOLUS
INTRAVENOUS | Status: DC | PRN
  Administered 2012-10-30: 100 mg via INTRAVENOUS

## 2012-10-30 MED ORDER — INSULIN REGULAR BOLUS VIA INFUSION
0.0000 [IU] | Freq: Three times a day (TID) | INTRAVENOUS | Status: DC
  Filled 2012-10-30: qty 10

## 2012-10-30 MED ORDER — BISACODYL 10 MG RE SUPP
10.0000 mg | Freq: Every day | RECTAL | Status: DC
  Filled 2012-10-30: qty 1

## 2012-10-30 MED ORDER — DOBUTAMINE IN D5W 4-5 MG/ML-% IV SOLN
2.5000 ug/kg/min | INTRAVENOUS | Status: DC
  Administered 2012-10-30: 3 ug/kg/min via INTRAVENOUS
  Filled 2012-10-30: qty 250

## 2012-10-30 MED ORDER — DEXMEDETOMIDINE HCL IN NACL 400 MCG/100ML IV SOLN
0.1000 ug/kg/h | INTRAVENOUS | Status: DC
  Filled 2012-10-30: qty 100

## 2012-10-30 MED ORDER — MIDAZOLAM HCL 2 MG/2ML IJ SOLN
2.0000 mg | INTRAMUSCULAR | Status: DC | PRN
  Administered 2012-10-30 – 2012-10-31 (×6): 2 mg via INTRAVENOUS
  Filled 2012-10-30 (×6): qty 2

## 2012-10-30 MED ORDER — ONDANSETRON HCL 4 MG/2ML IJ SOLN
4.0000 mg | Freq: Four times a day (QID) | INTRAMUSCULAR | Status: DC | PRN
  Administered 2012-11-01: 4 mg via INTRAVENOUS
  Filled 2012-10-30: qty 2

## 2012-10-30 MED ORDER — MORPHINE SULFATE 2 MG/ML IJ SOLN
2.0000 mg | INTRAMUSCULAR | Status: DC | PRN
  Administered 2012-10-30: 5 mg via INTRAVENOUS
  Filled 2012-10-30: qty 3

## 2012-10-30 MED ORDER — FAMOTIDINE IN NACL 20-0.9 MG/50ML-% IV SOLN
20.0000 mg | Freq: Two times a day (BID) | INTRAVENOUS | Status: AC
  Administered 2012-10-30 – 2012-10-31 (×2): 20 mg via INTRAVENOUS
  Filled 2012-10-30: qty 50

## 2012-10-30 MED ORDER — ALBUMIN HUMAN 5 % IV SOLN
250.0000 mL | INTRAVENOUS | Status: AC | PRN
  Administered 2012-10-30 – 2012-10-31 (×2): 250 mL via INTRAVENOUS

## 2012-10-30 MED ORDER — DOCUSATE SODIUM 100 MG PO CAPS
200.0000 mg | ORAL_CAPSULE | Freq: Every day | ORAL | Status: DC
  Administered 2012-10-31 – 2012-11-02 (×3): 200 mg via ORAL
  Filled 2012-10-30 (×3): qty 2

## 2012-10-30 MED ORDER — OXYCODONE HCL 5 MG PO TABS
5.0000 mg | ORAL_TABLET | ORAL | Status: DC | PRN
  Administered 2012-10-31 – 2012-11-01 (×3): 5 mg via ORAL
  Filled 2012-10-30 (×3): qty 1

## 2012-10-30 MED ORDER — PHENYLEPHRINE HCL 10 MG/ML IJ SOLN
20.0000 mg | INTRAVENOUS | Status: DC | PRN
  Administered 2012-10-30: 5 ug/min via INTRAVENOUS

## 2012-10-30 MED ORDER — PROTAMINE SULFATE 10 MG/ML IV SOLN
INTRAVENOUS | Status: DC | PRN
  Administered 2012-10-30 (×3): 50 mg via INTRAVENOUS
  Administered 2012-10-30: 20 mg via INTRAVENOUS
  Administered 2012-10-30: 50 mg via INTRAVENOUS
  Administered 2012-10-30: 30 mg via INTRAVENOUS
  Administered 2012-10-30: 50 mg via INTRAVENOUS

## 2012-10-30 MED ORDER — GLYCOPYRROLATE 0.2 MG/ML IJ SOLN
INTRAMUSCULAR | Status: DC | PRN
  Administered 2012-10-30: 0.2 mg via INTRAVENOUS

## 2012-10-30 SURGICAL SUPPLY — 113 items
ADAPTER CARDIO PERF ANTE/RETRO (ADAPTER) ×4 IMPLANT
ADH SKN CLS APL DERMABOND .7 (GAUZE/BANDAGES/DRESSINGS) ×6
ADH SRG 12 PREFL SYR 3 SPRDR (MISCELLANEOUS)
ADPR PRFSN 84XANTGRD RTRGD (ADAPTER) ×3
APPLICATOR TIP BIOGLUE STANDRD (MISCELLANEOUS) IMPLANT
ATTRACTOMAT 16X20 MAGNETIC DRP (DRAPES) ×4 IMPLANT
BAG DECANTER FOR FLEXI CONT (MISCELLANEOUS) ×4 IMPLANT
BLADE CORE FAN STRYKER (BLADE) ×5 IMPLANT
BLADE STERNUM SYSTEM 6 (BLADE) ×4 IMPLANT
BLADE SURG 11 STRL SS (BLADE) ×5 IMPLANT
CANISTER SUCTION 2500CC (MISCELLANEOUS) ×4 IMPLANT
CANNULA FEM VENOUS REMOTE 22FR (CANNULA) ×1 IMPLANT
CANNULA GUNDRY RCSP 15FR (MISCELLANEOUS) ×4 IMPLANT
CANNULA VENNOUS METAL TIP 20FR (CANNULA) ×1 IMPLANT
CATH CPB KIT OWEN (MISCELLANEOUS) ×4 IMPLANT
CATH HEART VENT LEFT (CATHETERS) ×3 IMPLANT
CATH ROBINSON RED A/P 18FR (CATHETERS) ×12 IMPLANT
CATH THORACIC 36FR (CATHETERS) IMPLANT
CATH THORACIC 36FR RT ANG (CATHETERS) ×4 IMPLANT
CAUTERY HIGH TEMP VAS (MISCELLANEOUS) ×1 IMPLANT
CLOTH BEACON ORANGE TIMEOUT ST (SAFETY) ×4 IMPLANT
CONNECTOR 1/2X3/8X1/2 3 WAY (MISCELLANEOUS) ×1
CONNECTOR 1/2X3/8X1/2 3WAY (MISCELLANEOUS) IMPLANT
CONT SPEC 4OZ CLIKSEAL STRL BL (MISCELLANEOUS) ×1 IMPLANT
COVER SURGICAL LIGHT HANDLE (MISCELLANEOUS) ×8 IMPLANT
CRADLE DONUT ADULT HEAD (MISCELLANEOUS) ×4 IMPLANT
DERMABOND ADVANCED (GAUZE/BANDAGES/DRESSINGS) ×2
DERMABOND ADVANCED .7 DNX12 (GAUZE/BANDAGES/DRESSINGS) IMPLANT
DRAIN CHANNEL 32F RND 10.7 FF (WOUND CARE) ×1 IMPLANT
DRAPE INCISE IOBAN 66X45 STRL (DRAPES) ×4 IMPLANT
DRAPE SLUSH/WARMER DISC (DRAPES) ×4 IMPLANT
DRSG COVADERM 4X14 (GAUZE/BANDAGES/DRESSINGS) ×4 IMPLANT
ELECT REM PT RETURN 9FT ADLT (ELECTROSURGICAL) ×8
ELECTRODE REM PT RTRN 9FT ADLT (ELECTROSURGICAL) ×6 IMPLANT
GLOVE BIO SURGEON STRL SZ 6 (GLOVE) ×4 IMPLANT
GLOVE BIO SURGEON STRL SZ 6.5 (GLOVE) ×2 IMPLANT
GLOVE BIO SURGEON STRL SZ7 (GLOVE) IMPLANT
GLOVE BIO SURGEON STRL SZ7.5 (GLOVE) IMPLANT
GLOVE BIO SURGEON STRL SZ8 (GLOVE) ×1 IMPLANT
GLOVE BIOGEL PI IND STRL 6 (GLOVE) IMPLANT
GLOVE BIOGEL PI IND STRL 7.0 (GLOVE) IMPLANT
GLOVE BIOGEL PI INDICATOR 6 (GLOVE) ×2
GLOVE BIOGEL PI INDICATOR 7.0 (GLOVE) ×5
GLOVE ORTHO TXT STRL SZ7.5 (GLOVE) ×12 IMPLANT
GOWN STRL NON-REIN LRG LVL3 (GOWN DISPOSABLE) ×20 IMPLANT
GRAFT GELWEAVE VALSALVA 28 (Prosthesis & Implant Heart) IMPLANT
GRAFT GELWEAVE VALSALVA 28CM (Prosthesis & Implant Heart) ×4 IMPLANT
HEMOSTAT POWDER SURGIFOAM 1G (HEMOSTASIS) ×12 IMPLANT
INSERT FOGARTY XLG (MISCELLANEOUS) ×4 IMPLANT
KIT BASIN OR (CUSTOM PROCEDURE TRAY) ×4 IMPLANT
KIT DILATOR VASC 18G NDL (KITS) ×1 IMPLANT
KIT DRAINAGE VACCUM ASSIST (KITS) ×1 IMPLANT
KIT ROOM TURNOVER OR (KITS) ×4 IMPLANT
KIT SUCTION CATH 14FR (SUCTIONS) ×4 IMPLANT
LEAD PACING MYOCARDI (MISCELLANEOUS) ×4 IMPLANT
LINE VENT (MISCELLANEOUS) ×1 IMPLANT
NS IRRIG 1000ML POUR BTL (IV SOLUTION) ×20 IMPLANT
PACK OPEN HEART (CUSTOM PROCEDURE TRAY) ×4 IMPLANT
PAD ARMBOARD 7.5X6 YLW CONV (MISCELLANEOUS) ×8 IMPLANT
PUNCH AORTIC ROT 5.0MM RCL 50 (MISCELLANEOUS) ×1 IMPLANT
PUNCH AORTIC ROTATE 5MM 8IN (MISCELLANEOUS) ×1 IMPLANT
SEALANT SURG COSEAL 8ML (VASCULAR PRODUCTS) ×1 IMPLANT
SET CARDIOPLEGIA MPS 5001102 (MISCELLANEOUS) ×1 IMPLANT
SET IRRIG TUBING LAPAROSCOPIC (IRRIGATION / IRRIGATOR) ×4 IMPLANT
SPONGE GAUZE 4X4 12PLY (GAUZE/BANDAGES/DRESSINGS) ×7 IMPLANT
SPONGE LAP 18X18 X RAY DECT (DISPOSABLE) ×4 IMPLANT
SPONGE LAP 4X18 X RAY DECT (DISPOSABLE) ×5 IMPLANT
SUT BONE WAX W31G (SUTURE) ×4 IMPLANT
SUT ETHIBON 2 0 V 52N 30 (SUTURE) ×3 IMPLANT
SUT ETHIBON EXCEL 2-0 V-5 (SUTURE) IMPLANT
SUT ETHIBOND 2 0 SH (SUTURE)
SUT ETHIBOND 2 0 SH 36X2 (SUTURE) IMPLANT
SUT ETHIBOND 2 0 V4 (SUTURE) IMPLANT
SUT ETHIBOND 2 0V4 GREEN (SUTURE) IMPLANT
SUT ETHIBOND 4 0 RB 1 (SUTURE) IMPLANT
SUT ETHIBOND NAB MH 2-0 36IN (SUTURE) ×1 IMPLANT
SUT ETHIBOND V-5 VALVE (SUTURE) IMPLANT
SUT ETHIBOND X763 2 0 SH 1 (SUTURE) ×12 IMPLANT
SUT MNCRL AB 3-0 PS2 18 (SUTURE) ×9 IMPLANT
SUT PDS AB 1 CTX 36 (SUTURE) ×9 IMPLANT
SUT PROLENE 3 0 SH DA (SUTURE) ×4 IMPLANT
SUT PROLENE 3 0 SH1 36 (SUTURE) ×4 IMPLANT
SUT PROLENE 4 0 RB 1 (SUTURE) ×40
SUT PROLENE 4 0 SH DA (SUTURE) IMPLANT
SUT PROLENE 4-0 RB1 .5 CRCL 36 (SUTURE) IMPLANT
SUT PROLENE 5 0 C 1 36 (SUTURE) ×2 IMPLANT
SUT PROLENE 5 0 C1 (SUTURE) ×4 IMPLANT
SUT PROLENE 6 0 C 1 30 (SUTURE) ×2 IMPLANT
SUT SILK  1 MH (SUTURE) ×1
SUT SILK 1 MH (SUTURE) ×3 IMPLANT
SUT SILK 2 0 SH CR/8 (SUTURE) ×1 IMPLANT
SUT SILK 3 0 (SUTURE) ×4
SUT SILK 3 0 SH CR/8 (SUTURE) IMPLANT
SUT SILK 3-0 18XBRD TIE 12 (SUTURE) IMPLANT
SUT STEEL 6MS V (SUTURE) ×1 IMPLANT
SUT STEEL SZ 6 DBL 3X14 BALL (SUTURE) ×2 IMPLANT
SUT VIC AB 1 CTX 36 (SUTURE) ×4
SUT VIC AB 1 CTX36XBRD ANBCTR (SUTURE) IMPLANT
SUT VIC AB 2-0 CT1 18 (SUTURE) ×1 IMPLANT
SUT VIC AB 2-0 CTX 27 (SUTURE) ×4 IMPLANT
SUT VICRYL 2 0 J607H (SUTURE) ×1 IMPLANT
SUTURE E-PAK OPEN HEART (SUTURE) ×4 IMPLANT
SYR 10ML KIT SKIN ADHESIVE (MISCELLANEOUS) IMPLANT
SYSTEM SAHARA CHEST DRAIN ATS (WOUND CARE) ×4 IMPLANT
TAPE CLOTH SURG 4X10 WHT LF (GAUZE/BANDAGES/DRESSINGS) ×1 IMPLANT
TOWEL OR 17X24 6PK STRL BLUE (TOWEL DISPOSABLE) ×4 IMPLANT
TOWEL OR 17X26 10 PK STRL BLUE (TOWEL DISPOSABLE) ×8 IMPLANT
TRAY FOLEY IC TEMP SENS 14FR (CATHETERS) ×4 IMPLANT
TUBE SUCT INTRACARD DLP 20F (MISCELLANEOUS) ×4 IMPLANT
UNDERPAD 30X30 INCONTINENT (UNDERPADS AND DIAPERS) ×4 IMPLANT
VALVE MAGNA EASE AORTIC 25MM (Prosthesis & Implant Heart) ×1 IMPLANT
VENT LEFT HEART 12002 (CATHETERS) ×4
WATER STERILE IRR 1000ML POUR (IV SOLUTION) ×8 IMPLANT

## 2012-10-30 NOTE — Interval H&P Note (Signed)
History and Physical Interval Note:  10/30/2012 7:27 AM  Thomas Henderson  has presented today for surgery, with the diagnosis of ascending thoracic aortic aneursym  The various methods of treatment have been discussed with the patient and family. After consideration of risks, benefits and other options for treatment, the patient has consented to  Procedure(s): REDO STERNOTOMY (N/A) REDO ASCENDING AORTIC ROOT REPLACEMENT (N/A) as a surgical intervention .  The patient's history has been reviewed, patient examined, no change in status, stable for surgery.  I have reviewed the patient's chart and labs.  Questions were answered to the patient's satisfaction.     OWEN,CLARENCE H

## 2012-10-30 NOTE — Progress Notes (Signed)
  Echocardiogram Echocardiogram Transesophageal has been performed.  Margreta Journey 10/30/2012, 11:07 AM

## 2012-10-30 NOTE — Progress Notes (Signed)
TCTS Sedated on vent CI 1.68 , PAD 25 Will start low dose dobutamine- native HR is 72/min Not bleeding- dosed with Novo 7

## 2012-10-30 NOTE — Progress Notes (Signed)
Blood end time 

## 2012-10-30 NOTE — Op Note (Addendum)
CARDIOTHORACIC SURGERY OPERATIVE NOTE  Date of Procedure:  10/30/2012  Preoperative Diagnosis: Severe Aortic Stenosis   Postoperative Diagnosis: Same   Procedure:    Redo Median Sternotomy for Repair of Ascending Thoracic Aortic Aneurysm using Biological Bentall Aortic Root Replacement with reimplantation of left main and right coronary arteries  Edwards Magna Ease Pericardial Tissue Valve (size 25 mm, model # 3300TFX, serial # [id number])  Terumo Gelweave Valsalva aortic root conduit (size 28 mm, catalogue #[id number] ADP, serial #[id number])   Surgeon: Thomas Decent. Cornelius Moras, Thomas Henderson  Assistant: Ardelle Balls, PA-C  Anesthesia: Thomas Person, Thomas Henderson  Operative Findings:  6.2 cm aneurysm of the aortic root  Normal LV systolic function     BRIEF CLINICAL NOTE AND INDICATIONS FOR SURGERY  Patient is a 71 yo male who returns for followup of ascending thoracic aortic aneurysm. He originally underwent aortic valve replacement using a bioprosthetic tissue valve and mitral valve repair in 2008. At that time his proximal ascending aorta was only mildly dilated. Followup echocardiograms suggested the interval increase in size of the patient's aortic root, which was confirmed on MRA and CT imaging of the thoracic aorta.  The maximum transverse diameter of the proximal aorta is now greater than 6 cm. Clinically the patient is doing very well with no symptoms of chest pain nor exertional shortness of breath. Followup echocardiogram demonstrates normal left ventricular function with normal functioning bioprosthetic tissue valve in the aortic position and intact mitral valve repair with no mitral regurgitation. Left and right heart catheterization demonstrates nonobstructive coronary artery disease with normal right heart pressures.  The patient has been seen in consultation and counseled at length regarding the indications, risks and potential benefits of surgery.  All questions have been answered, and the patient  provides full informed consent for the operation as described.      DETAILS OF THE OPERATIVE PROCEDURE  Preparation:  The patient is brought to the operating room on the above mentioned date and central monitoring was established by the anesthesia team including placement of Swan-Ganz catheter and radial arterial line. The patient is placed in the supine position on the operating table.  Intravenous antibiotics are administered. General endotracheal anesthesia is induced uneventfully. A Foley catheter is placed.  Baseline transesophageal echocardiogram was performed.  Findings were notable for normal left ventricular systolic function with mild left ventricular hypertrophy. There is normal functioning bioprosthetic tissue valve in the aortic position. There is no aortic insufficiency. There is a annuloplasty ring in the mitral position. There is normal mitral valve function with no mitral regurgitation. The aortic root is dilated to greater than 6 cm transverse diameter. The distal portion of the ascending thoracic aorta tapers down to near normal diameter.  The patient's chest, abdomen, both groins, and both lower extremities are prepared and draped in a sterile manner. A time out procedure is performed.   Surgical Approach:  A redo median sternotomy incision was performed.  All of the old sternal wires are moved. The sternum is divided using an oscillating saw. Sternal reentry was uneventful. Dissection is begun to free up the underside of the sternum from the structures of the anterior mediastinum.  Dissection is then continued after a retractor is placed to begin dissection along the ascending thoracic aorta.  Both edges of the pericardium are easily identified and surgical dissection is technically straightforward. There is massive enlargement of the aortic root and proximal ascending thoracic aorta.  The distal portion of the ascending thoracic aorta tapers down to near normal  size at level  of the transverse aortic arch.  There appears to be adequate room to construct a distal anastomosis for replacement of the proximal aorta without using total circulatory arrest. However, there is not adequate room for central aortic cannulation.   Extracorporeal Cardiopulmonary Bypass and Myocardial Protection:  The right axillary artery is utilized for arterial access for cardioplegia bypass.  A small incision is made in the right deltopectoral groove. The incision is completed through the subcutaneous tissues with electrocautery. The muscle fibers of the pectoralis major muscle are split longitudinally and the pectoralis minor muscle was retracted laterally. The deep pectoralis fascia is incised sharply. The right axillary artery is identified and dissected over a short distance. The right axillary artery is circled with a vessel loop.  The right common femoral vein is cannulated with Seldinger technique. The patient is heparinized systemically.   Adequate heparinization is verified. The right common femoral vein is dilated with serial dilators and cannulated using a 22 French long femoral venous cannula placed under transesophageal echocardiogram guidance.  An 8 mm toTerumo Gore-Tex graft with pre-attached cardioplegia bypass connector is sewn in end-to-side fashion to the right axillary artery for arterial access.  The entire pre-bypass portion of the operation was notable for stable hemodynamics.  Cardiopulmonary bypass was begun.  Vacuum assist venous drainage is utilized. Dissection is continued to free up the lateral surface of the right atrium and to free up circumferentially the ascending thoracic aorta.  A retrograde cardioplegia cannula is placed through the right atrium into the coronary sinus.  A left ventricular vent is placed through the right superior pulmonary vein.  The operative field was continuously flooded with carbon dioxide gas.  A cardioplegia cannula is placed in the ascending  aorta.    The patient is cooled to 28C systemic temperature.  The aortic cross clamp is applied just below the innominate artery and cold blood cardioplegia is delivered initially in an antegrade fashion through the aortic root.   Supplemental cardioplegia is given retrograde through the coronary sinus catheter.  Iced saline slush is applied for topical hypothermia.  The initial cardioplegic arrest is rapid with early diastolic arrest.  Repeat doses of cardioplegia are administered intermittently every 20 to 30 minutes throughout the entire cross clamp portion of the operation through the coronary sinus catheter in order to maintain completely flat electrocardiogram.  Myocardial protection was felt to be excellent.   Resection of Ascending Thoracic Aortic Aneurysm:   The ascending thoracic aorta is transected just below the aortic cross-clamp. The entire aneurysmal aortic root is resected. The left main and right coronary arteries are each mobilized on separate Carrell patches.  The old bioprosthetic tissue valve in the aortic position is excised sharply. There is notably some degradation of the valve leaflets visible, although there was normal valve function and no sign of any leaflet calcification.   Aortic Root Replacement:  Aortic root replacement was performed using a biological tissue valve inserted into a synthetic graft conduit.  An [facility] Ease pericardial tissue valve (size 25 mm, model # 3300TFX, serial # [id number]) is inserted into the end of a Terumo Gelweave Valsalva gelatin impregnated woven vascular graft (size 28 mm, catalogue #[id number] ADP, serial #[id number]) after trimming the proximal few rings of the vascular graft.  The proximal suture line is constructed using interrupted horizontal mattress 2-0 Ethibond pledgeted sutures with pledgets in the supraannular position.  The left main and the right coronary artery are each reimplanted onto the Valsalva portion of  the prosthetic graft  after creating circular holes in the appropriate region of the graft using thermal cautery with running 5-0 Prolene suture.  The distal end of the aortic graft is trimmed to an appropriate length and beveled. The distal anastomosis is performed in end-to-end fashion to the transected end of the aorta using running 4-0 Prolene suture.    Procedure Completion:  A small hole was made on the anterior surface of the ascending aortic graft using thermal cautery to evacuate any air in the aortic root.  One final dose of warm retrograde "hot shot" cardioplegia was administered retrograde through the coronary sinus catheter while all air was evacuated through the aortic root.  The aortic cross clamp was removed after a total cross clamp time of 126 minutes.  The heart began to beat spontaneously without need for cardioversion.  Epicardial pacing wires are fixed to the right ventricular outflow tract and to the right atrial appendage. The patient is rewarmed to 37C temperature. During rewarming the aortic graft is inspected carefully for hemostasis.  The left ventricular vent is removed.  The patient is weaned and disconnected from cardiopulmonary bypass.  The patient's rhythm at separation from bypass was sinus.  The patient was weaned from cardioplegic bypass without any inotropic support. Total cardiopulmonary bypass time for the operation was 208 minutes.  Followup transesophageal echocardiogram performed after separation from bypass revealed a well-seated aortic valve prosthesis that was functioning normally and without any sign of perivalvular leak.  Left ventricular function was unchanged from preoperatively.  Protamine was administered to reverse the anticoagulation. The femoral venous cannula is removed and manual pressure is held on the groin for 30 minutes. The right axillary graft is transected and oversewn.  The mediastinum and pleural space were inspected for hemostasis and irrigated with saline  solution. There was severe coagulopathy.  The patient was transfused a total of 4 units fresh frozen plasma and 4 packs of adult platelets 4 coagulopathy and thrombocytopenia. Ultimately 1 dose of recombinant human factor VII is administered.  The patient was also transfused a total of 2 units packed red blood cells for acute blood loss anemia.  The mediastinum was drained using 2 chest tubes placed through separate stab incisions inferiorly.  The soft tissues anterior to the aorta were reapproximated loosely. The sternum is closed with double strength sternal wire. The soft tissues anterior to the sternum were closed in multiple layers and the skin is closed with a running subcuticular skin closure.  The post-bypass portion of the operation was notable for stable rhythm and hemodynamics.    Disposition:  The patient tolerated the procedure well and is transported to the surgical intensive care in stable condition. There are no intraoperative complications. All sponge instrument and needle counts are verified correct at completion of the operation.    Thomas Decent. Cornelius Moras Thomas Henderson 10/30/2012 6:24 PM

## 2012-10-30 NOTE — Brief Op Note (Addendum)
10/30/2012  1:30 PM  PATIENT:  Thomas Henderson  72 y.o. male  PRE-OPERATIVE DIAGNOSIS:  Ascending thoracic aortic aneurysm and root dilatation  POST-OPERATIVE DIAGNOSIS:  Ascending thoracic aortic aneurysm and root dilatation  PROCEDURE:  REDO MEDIAN STERNOTOMY for REDO AORTIC VALVE REPLACEMENT (AVR) (using Magna Ease Pericardial Tissue Valve, size 25 mm) BENTALL AORTIC ROOT REPLACEMENT, and REPLACEMENT ASCENDING THORACIC AORTA   SURGEON:    Purcell Nails, MD  ASSISTANTS:  Ardelle Balls, PA-C  ANESTHESIA:   Bedelia Person, MD  CROSSCLAMP TIME:   126'  CARDIOPULMONARY BYPASS TIME: 208'  FINDINGS:  6.2 cm aneurysm of the aortic root  Normal LV systolic function  COMPLICATIONS: none  PRE OP WEIGHT: 95 kg  PATIENT DISPOSITION:   TO SICU IN STABLE CONDITION  [name] 10/30/2012 4:50 PM

## 2012-10-30 NOTE — Anesthesia Postprocedure Evaluation (Signed)
  Anesthesia Post-op Note  Patient: Thomas Henderson  Procedure(s) Performed: Procedure(s): REDO STERNOTOMY (N/A) REDO AORTIC VALVE REPLACEMENT (AVR) (N/A) BENTALL PROCEDURE (N/A) REPLACEMENT ASCENDING AORTA  Patient Location: ICU  Anesthesia Type:General  Level of Consciousness: Patient remains intubated per anesthesia plan  Airway and Oxygen Therapy: Patient remains intubated per anesthesia plan and Patient placed on Ventilator (see vital sign flow sheet for setting)  Post-op Pain: none  Post-op Assessment: Post-op Vital signs reviewed, Patient's Cardiovascular Status Stable, Respiratory Function Stable, Patent Airway and Pain level controlled  Post-op Vital Signs: stable  Complications: No apparent anesthesia complications

## 2012-10-30 NOTE — Transfer of Care (Signed)
Immediate Anesthesia Transfer of Care Note  Patient: Thomas Henderson  Procedure(s) Performed: Procedure(s): REDO STERNOTOMY (N/A) REDO AORTIC VALVE REPLACEMENT (AVR) (N/A) BENTALL PROCEDURE (N/A) REPLACEMENT ASCENDING AORTA  Patient Location: SICU  Anesthesia Type:General  Level of Consciousness: Patient remains intubated per anesthesia plan  Airway & Oxygen Therapy: Patient remains intubated per anesthesia plan and Patient placed on Ventilator (see vital sign flow sheet for setting)  Post-op Assessment: Report given to PACU RN and Post -op Vital signs reviewed and stable  Post vital signs: Reviewed and stable  Complications: No apparent anesthesia complications

## 2012-10-30 NOTE — Anesthesia Procedure Notes (Signed)
Procedure Name: Intubation Date/Time: 10/30/2012 7:53 AM Performed by: Luster Landsberg Pre-anesthesia Checklist: Patient identified, Emergency Drugs available, Suction available, Patient being monitored and Timeout performed Patient Re-evaluated:Patient Re-evaluated prior to inductionOxygen Delivery Method: Circle system utilized Preoxygenation: Pre-oxygenation with 100% oxygen Intubation Type: IV induction and Rapid sequence Laryngoscope Size: Mac and 4 Grade View: Grade II Tube type: Oral Tube size: 8.0 mm Number of attempts: 1 Airway Equipment and Method: Stylet Placement Confirmation: ETT inserted through vocal cords under direct vision,  positive ETCO2,  CO2 detector and breath sounds checked- equal and bilateral Secured at: 23 cm Tube secured with: Tape Dental Injury: Teeth and Oropharynx as per pre-operative assessment

## 2012-10-30 NOTE — Anesthesia Preprocedure Evaluation (Signed)
Anesthesia Evaluation  Patient identified by MRN, date of birth, ID band Patient awake    Reviewed: Allergy & Precautions, H&P , NPO status , Patient's Chart, lab work & pertinent test results  Airway Mallampati: II TM Distance: >3 FB Neck ROM: Full    Dental   Pulmonary          Cardiovascular hypertension, + Peripheral Vascular Disease Rhythm:Regular Rate:Normal     Neuro/Psych    GI/Hepatic GERD-  ,  Endo/Other    Renal/GU      Musculoskeletal   Abdominal (+) + obese,   Peds  Hematology   Anesthesia Other Findings   Reproductive/Obstetrics                           Anesthesia Physical Anesthesia Plan  ASA: III  Anesthesia Plan: General   Post-op Pain Management:    Induction: Intravenous  Airway Management Planned: Oral ETT  Additional Equipment: Arterial line, PA Cath and 3D TEE  Intra-op Plan:   Post-operative Plan: Post-operative intubation/ventilation  Informed Consent: I have reviewed the patients History and Physical, chart, labs and discussed the procedure including the risks, benefits and alternatives for the proposed anesthesia with the patient or authorized representative who has indicated his/her understanding and acceptance.     Plan Discussed with: CRNA and Surgeon  Anesthesia Plan Comments:         Anesthesia Quick Evaluation

## 2012-10-30 NOTE — OR Nursing (Signed)
First call made to volunteer desk and SICU at [phone].

## 2012-10-31 ENCOUNTER — Inpatient Hospital Stay (HOSPITAL_COMMUNITY): Payer: Medicare Other

## 2012-10-31 LAB — CREATININE, SERUM
Creatinine, Ser: 1.35 mg/dL (ref 0.50–1.35)
Creatinine, Ser: 1.21 mg/dL (ref 0.50–1.35)
GFR calc Af Amer: 60 mL/min — ABNORMAL LOW (ref >90)
GFR calc Af Amer: 68 mL/min — ABNORMAL LOW (ref >90)
GFR calc non Af Amer: 52 mL/min — ABNORMAL LOW (ref >90)
GFR calc non Af Amer: 59 mL/min — ABNORMAL LOW (ref >90)

## 2012-10-31 LAB — CBC
HCT: 28.6 % — ABNORMAL LOW (ref 39.0–52.0)
HCT: 27 % — ABNORMAL LOW (ref 39.0–52.0)
Hemoglobin: 10 g/dL — ABNORMAL LOW (ref 13.0–17.0)
Hemoglobin: 9.6 g/dL — ABNORMAL LOW (ref 13.0–17.0)
Hemoglobin: 10 g/dL — ABNORMAL LOW (ref 13.0–17.0)
MCH: 30.4 pg (ref 26.0–34.0)
MCH: 30.7 pg (ref 26.0–34.0)
MCHC: 35 g/dL (ref 30.0–36.0)
MCHC: 35.6 g/dL (ref 30.0–36.0)
MCV: 86.9 fL (ref 78.0–100.0)
MCV: 86.8 fL (ref 78.0–100.0)
Platelets: 124 10*3/uL — ABNORMAL LOW (ref 150–400)
Platelets: 109 10*3/uL — ABNORMAL LOW (ref 150–400)
RBC: 3.29 MIL/uL — ABNORMAL LOW (ref 4.22–5.81)
RBC: 3.26 MIL/uL — ABNORMAL LOW (ref 4.22–5.81)
RDW: 14.7 % (ref 11.5–15.5)
RDW: 15 % (ref 11.5–15.5)
WBC: 4.9 10*3/uL (ref 4.0–10.5)
WBC: 7.6 10*3/uL (ref 4.0–10.5)

## 2012-10-31 LAB — PREPARE FRESH FROZEN PLASMA
Unit division: 0
Unit division: 0
Unit division: 0

## 2012-10-31 LAB — POCT I-STAT 3, ART BLOOD GAS (G3+)
Acid-base deficit: 3 mmol/L — ABNORMAL HIGH (ref 0.0–2.0)
Bicarbonate: 23.3 mEq/L (ref 20.0–24.0)
Bicarbonate: 23 mEq/L (ref 20.0–24.0)
Bicarbonate: 24.6 mEq/L — ABNORMAL HIGH (ref 20.0–24.0)
O2 Saturation: 99 %
O2 Saturation: 97 %
Patient temperature: 36.6
TCO2: 25 mmol/L (ref 0–100)
TCO2: 24 mmol/L (ref 0–100)
TCO2: 26 mmol/L (ref 0–100)
pCO2 arterial: 43.9 mmHg (ref 35.0–45.0)
pH, Arterial: 7.506 — ABNORMAL HIGH (ref 7.350–7.450)
pH, Arterial: 7.406 (ref 7.350–7.450)
pO2, Arterial: 148 mmHg — ABNORMAL HIGH (ref 80.0–100.0)

## 2012-10-31 LAB — PREPARE PLATELET PHERESIS
Unit division: 0
Unit division: 0
Unit division: 0

## 2012-10-31 LAB — BASIC METABOLIC PANEL
BUN: 27 mg/dL — ABNORMAL HIGH (ref 6–23)
Creatinine, Ser: 1.39 mg/dL — ABNORMAL HIGH (ref 0.50–1.35)
GFR calc Af Amer: 58 mL/min — ABNORMAL LOW (ref >90)
GFR calc non Af Amer: 50 mL/min — ABNORMAL LOW (ref >90)
Glucose, Bld: 166 mg/dL — ABNORMAL HIGH (ref 70–99)

## 2012-10-31 LAB — POCT I-STAT, CHEM 8
Calcium, Ion: 1.16 mmol/L (ref 1.13–1.30)
Creatinine, Ser: 1.3 mg/dL (ref 0.50–1.35)
Glucose, Bld: 83 mg/dL (ref 70–99)
Glucose, Bld: 145 mg/dL — ABNORMAL HIGH (ref 70–99)
HCT: 14 % — ABNORMAL LOW (ref 39.0–52.0)
HCT: 28 % — ABNORMAL LOW (ref 39.0–52.0)
HCT: 28 % — ABNORMAL LOW (ref 39.0–52.0)
Hemoglobin: 4.8 g/dL — CL (ref 13.0–17.0)
Hemoglobin: 9.5 g/dL — ABNORMAL LOW (ref 13.0–17.0)
Hemoglobin: 9.5 g/dL — ABNORMAL LOW (ref 13.0–17.0)
Potassium: 2.5 mEq/L — CL (ref 3.5–5.1)
Potassium: 3.9 mEq/L (ref 3.5–5.1)
Potassium: 4.2 mEq/L (ref 3.5–5.1)
Sodium: 141 mEq/L (ref 135–145)
TCO2: 22 mmol/L (ref 0–100)

## 2012-10-31 LAB — GLUCOSE, CAPILLARY
Glucose-Capillary: 128 mg/dL — ABNORMAL HIGH (ref 70–99)
Glucose-Capillary: 128 mg/dL — ABNORMAL HIGH (ref 70–99)
Glucose-Capillary: 126 mg/dL — ABNORMAL HIGH (ref 70–99)
Glucose-Capillary: 130 mg/dL — ABNORMAL HIGH (ref 70–99)
Glucose-Capillary: 137 mg/dL — ABNORMAL HIGH (ref 70–99)
Glucose-Capillary: 104 mg/dL — ABNORMAL HIGH (ref 70–99)
Glucose-Capillary: 106 mg/dL — ABNORMAL HIGH (ref 70–99)
Glucose-Capillary: 103 mg/dL — ABNORMAL HIGH (ref 70–99)
Glucose-Capillary: 109 mg/dL — ABNORMAL HIGH (ref 70–99)
Glucose-Capillary: 156 mg/dL — ABNORMAL HIGH (ref 70–99)

## 2012-10-31 LAB — MAGNESIUM: Magnesium: 2.9 mg/dL — ABNORMAL HIGH (ref 1.5–2.5)

## 2012-10-31 MED ORDER — SODIUM CHLORIDE 0.9 % IV SOLN: INTRAVENOUS | Status: DC

## 2012-10-31 MED ORDER — INSULIN ASPART 100 UNIT/ML ~~LOC~~ SOLN
0.0000 [IU] | SUBCUTANEOUS | Status: DC
  Administered 2012-10-31 – 2012-11-01 (×5): 2 [IU] via SUBCUTANEOUS
  Administered 2012-11-01: 4 [IU] via SUBCUTANEOUS
  Administered 2012-11-02: 2 [IU] via SUBCUTANEOUS

## 2012-10-31 MED FILL — Potassium Chloride Inj 2 mEq/ML: INTRAVENOUS | Qty: 40 | Status: AC

## 2012-10-31 MED FILL — Lidocaine HCl IV Inj 20 MG/ML: INTRAVENOUS | Qty: 5 | Status: AC

## 2012-10-31 MED FILL — Magnesium Sulfate Inj 50%: INTRAMUSCULAR | Qty: 10 | Status: AC

## 2012-10-31 MED FILL — Heparin Sodium (Porcine) Inj 1000 Unit/ML: INTRAMUSCULAR | Qty: 30 | Status: AC

## 2012-10-31 MED FILL — Sodium Bicarbonate IV Soln 8.4%: INTRAVENOUS | Qty: 50 | Status: AC

## 2012-10-31 MED FILL — Sodium Chloride IV Soln 0.9%: INTRAVENOUS | Qty: 1000 | Status: AC

## 2012-10-31 MED FILL — Heparin Sodium (Porcine) Inj 1000 Unit/ML: INTRAMUSCULAR | Qty: 10 | Status: AC

## 2012-10-31 MED FILL — Sodium Chloride Irrigation Soln 0.9%: Qty: 3000 | Status: AC

## 2012-10-31 MED FILL — Electrolyte-R (PH 7.4) Solution: INTRAVENOUS | Qty: 6000 | Status: AC

## 2012-10-31 MED FILL — Mannitol IV Soln 20%: INTRAVENOUS | Qty: 500 | Status: AC

## 2012-10-31 MED FILL — Albumin, Human Inj 5%: INTRAVENOUS | Qty: 250 | Status: AC

## 2012-10-31 NOTE — Procedures (Signed)
Extubation Procedure Note  Patient Details:   Name: Thomas Henderson DOB: 04-24-1940 MRN: [id number]   Airway Documentation:     Evaluation  O2 sats: stable throughout Complications: No apparent complications Patient did tolerate procedure well. Bilateral Breath Sounds: Clear   Yes Pt extubated to a 4lpm [place], Sp02 100%, HR 90, 129/68  Melanee Spry 10/31/2012, 9:13 AM

## 2012-10-31 NOTE — Progress Notes (Signed)
Blood end time 

## 2012-10-31 NOTE — Progress Notes (Signed)
Patient ID: Thomas Henderson, male   DOB: 1940-05-27, 72 y.o.   MRN: [id number]  SICU Evening Rounds:  Hemodynamically stable A-paced 90  Urine output ok. Creat up a little to 1.3  No complaints

## 2012-10-31 NOTE — Progress Notes (Signed)
While waiting for the nipride gtt from pharmacy, pt BP stabilized with all other pressors off.  Will hold off starting nipride at this time and continue to monitor.

## 2012-10-31 NOTE — Progress Notes (Addendum)
[facility] DAILY ICU PROGRESS NOTE                   [address]            [name] [address]          [phone]   1 Day Post-Op Procedure(s) (LRB): REDO STERNOTOMY (N/A) REDO AORTIC VALVE REPLACEMENT (AVR) (N/A) BENTALL PROCEDURE (N/A) REPLACEMENT ASCENDING AORTA  Total Length of Stay:  LOS: 1 day   Subjective: Waiting on cpap gases, should be extubated soon   Objective: Vital signs in last 24 hours: Temp:  [94.1 F (34.5 C)-99 F (37.2 C)] 97.9 F (36.6 C) (08/01 0830) Pulse Rate:  [70-90] 90 (08/01 0830) Cardiac Rhythm:  [-] Atrial paced (08/01 0800) Resp:  [0-20] 10 (08/01 0830) BP: (84-134)/(58-90) 123/69 mmHg (08/01 0805) SpO2:  [100 %] 100 % (08/01 0830) Arterial Line BP: (89-187)/(48-90) 112/64 mmHg (08/01 0830) FiO2 (%):  [40 %-50 %] 40 % (08/01 0835) Weight:  [229 lb 11.5 oz (104.2 kg)] 229 lb 11.5 oz (104.2 kg) (08/01 0437)  Filed Weights   10/29/12 1426 10/31/12 0437  Weight: 210 lb 5 oz (95.397 kg) 229 lb 11.5 oz (104.2 kg)    Weight change: 19 lb 6.5 oz (8.803 kg)   Hemodynamic parameters for last 24 hours: PAP: (30-60)/(17-26) 31/21 mmHg CO:  [3.4 L/min-5.3 L/min] 5.3 L/min CI:  [1.7 L/min/m2-2.6 L/min/m2] 2.6 L/min/m2  Intake/Output from previous day: 07/31 0701 - 08/01 0700 In: 9180.6 [I.V.:3260.6; Blood:4120; NG/GT:60; IV Piggyback:1740] Out: 5470 [Urine:2325; Blood:2800; Chest Tube:345]  Intake/Output this shift: Total I/O In: 77.3 [I.V.:77.3] Out: 360 [Urine:50; Emesis/NG output:300; Chest Tube:10]  Vent Mode:  [-] CPAP;PSV FiO2 (%):  [40 %-50 %] 40 % Set Rate:  [4 bmp-12 bmp] 4 bmp Vt Set:  [510 mL] 510 mL PEEP:  [5 cmH20] 5 cmH20 Pressure Support:  [10 cmH20] 10 cmH20 Plateau Pressure:  [8 cmH20-20 cmH20] 8 cmH20    Current Meds: Scheduled Meds: . acetaminophen  1,000 mg Oral Q6H   Or  . acetaminophen (TYLENOL) oral liquid 160 mg/5 mL  1,000 mg Per Tube Q6H  . aspirin EC  325 mg Oral Daily   Or  . aspirin   324 mg Per Tube Daily  . bisacodyl  10 mg Oral Daily   Or  . bisacodyl  10 mg Rectal Daily  . cefUROXime (ZINACEF)  IV  1.5 g Intravenous Q12H  . docusate sodium  200 mg Oral Daily  . insulin regular  0-10 Units Intravenous TID WC  . metoprolol tartrate  12.5 mg Oral BID   Or  . metoprolol tartrate  12.5 mg Per Tube BID  . [START ON 11/01/2012] pantoprazole  40 mg Oral Daily  . sodium chloride  3 mL Intravenous Q12H   Continuous Infusions: . sodium chloride 20 mL/hr (10/30/12 1809)  . sodium chloride 10 mL/hr at 10/30/12 1715  . sodium chloride    . dexmedetomidine Stopped (10/31/12 0830)  . DOBUTamine Stopped (10/30/12 2200)  . insulin (NOVOLIN-R) infusion 2 Units/hr (10/31/12 0800)  . lactated ringers 20 mL/hr at 10/30/12 1715  . nitroGLYCERIN Stopped (10/30/12 1715)  . nitroPRUSSide    . phenylephrine (NEO-SYNEPHRINE) Adult infusion Stopped (10/31/12 0830)   PRN Meds:.albumin human, metoprolol, midazolam, morphine injection, ondansetron (ZOFRAN) IV, oxyCODONE, sodium chloride  General appearance: alert, cooperative and no distress Neurologic: grossly intact Heart: regular rate and rhythm Lungs: clear anteriorly Abdomen: soft. mild distension, nontender Extremities: warm, pas in place  Wound: dressings intact  Lab Results: CBC: Recent Labs  10/31/12 0037 10/31/12 0038 10/31/12 0415  WBC 4.9  --  5.1  HGB 10.0* 9.5* 9.6*  HCT 28.6* 28.0* 27.0*  PLT 124*  --  108*   BMET:  Recent Labs  10/28/12 1322  10/31/12 0038 10/31/12 0415  NA 137  < > 141 139  K 4.0  < > 4.2 4.1  CL 104  --  109 108  CO2 19  --   --  23  GLUCOSE 92  < > 143* 166*  BUN 33*  --  26* 27*  CREATININE 1.18  < > 1.30 1.39*  CALCIUM 9.9  --   --  7.6*  < > = values in this interval not displayed.  PT/INR:  Recent Labs  10/30/12 1735  LABPROT 11.2*  INR 0.82   ABG    Component Value Date/Time   PHART 7.332* 10/31/2012 0412   PCO2ART 43.9 10/31/2012 0412   PO2ART 148.0* 10/31/2012 0412    HCO3 23.3 10/31/2012 0412   TCO2 25 10/31/2012 0412   ACIDBASEDEF 3.0* 10/31/2012 0412   O2SAT 99.0 10/31/2012 0412      Radiology: Dg Chest Portable 1 View In Am  10/31/2012   *RADIOLOGY REPORT*  Clinical Data: Postop.  PORTABLE CHEST - 1 VIEW  Comparison: CXR from yesterday.  Findings: Endotracheal tube ends 5 cm above the carina.  A right IJ approach Swan-Ganz catheter ends in the right main pulmonary artery region. Gastric suction tube crosses the diaphragm.  Thoracic drain remains.  Heart size and mediastinal contours unchanged when accounting for rightward rotation.  Status post aortic valve replacement.  Improved lung aeration in general.  Hazy lower lung opacities likely pleural fluid and atelectasis.  No pneumothorax detected.  IMPRESSION:  1.  Satisfactory appearance of tubes and lines. 2.  Basilar atelectasis and pleural effusions.   Original Report Authenticated By: Tiburcio Pea   Dg Chest Portable 1 View  10/30/2012   *RADIOLOGY REPORT*  Clinical Data: Postop cardiac surgery  PORTABLE CHEST - 1 VIEW  Comparison: 10/28/2012  Findings: Endotracheal tube 3.5 cm above the carina.  Swan-Ganz catheter in the proximal right pulmonary artery from a right IJ approach.  NG tube extends into the stomach with the tip not visualized.  Mediastinal drain noted.  The patient is status post aortic and mitral valve replacements.  Heart is enlarged with mild diffuse edema pattern and scattered atelectasis in the hilar regions and both lung bases.  Small pleural effusions noted.  No pneumothorax.  IMPRESSION: Mild edema pattern with scattered hilar and bibasilar atelectasis. Small effusions.  No pneumothorax  Support apparatus in good position.   Original Report Authenticated By: Judie Petit. Miles Costain, M.D.     Assessment/Plan: S/P Procedure(s) (LRB): REDO STERNOTOMY (N/A) REDO AORTIC VALVE REPLACEMENT (AVR) (N/A) BENTALL PROCEDURE (N/A) REPLACEMENT ASCENDING AORTA  1 cont wean to extubate 2 cont AAI pace for  bradycardia 3 off all pressors- hemodyn stable 4 expected ABL anemia- stable - monitor 5 UO- good, will require some diuretic for volume overload 6 thrombocytopenia- monitor 7 sugars with adeq control 8 see progression orders GOLD,WAYNE E 10/31/2012 8:38 AM   OOB to chair Paced rhytm Tubes out patient examined and medical record reviewed,agree with above note. VAN TRIGT III,Destynie Toomey 10/31/2012

## 2012-10-31 NOTE — Progress Notes (Signed)
Utilization review completed. Romell Cavanah, RN, BSN. 

## 2012-10-31 NOTE — Progress Notes (Signed)
Pt BP fluctuates between SBP 70s-220s.  Multiple drips titrated to achieve desired BP with no success.  Called Dr. Donata Clay.  Received order to start Nipride gtt and titrate to keep SBP <150, give 5mg  IV morphine and do not wean ventilator.  Will initiate orders and continue to monitor.

## 2012-10-31 NOTE — Op Note (Signed)
NAMEDELMA, VILLALVA NO.:  [id number]  MEDICAL RECORD NO.:  [id number]  LOCATION:  2311                         FACILITY:  [facility]  PHYSICIAN:  Bedelia Person, M.D.        DATE OF BIRTH:  1941/03/18  DATE OF PROCEDURE:  10/30/2012 DATE OF DISCHARGE:                              OPERATIVE REPORT   Mr. Gartrell is a 72 year old male who has undergone aortic valve replacement and mitral valve repair in the past.  At this time, he is noted to have ascending aortic aneurysm, which will be resected and repaired under general anesthesia.  The patient has no history of esophageal or gastric pathology.  The airway was secured with an oral endotracheal tube.  An orogastric tube was then placed empty stomach contents then removed.  The 3D transesophageal transducer was heavily lubricated and placed down the oropharynx with no significant resistance.  At the completion of the procedure, the probe was removed.  There was no evidence of oral or pharyngeal damage.  During the bypass, the probe was left in the neutral, unflexed position.  PREBYPASS EXAMINATION:  Left ventricle revealed good contractility with no segmental defects.  Left atrium appeared to be normal size.  There was no evidence of appendage.  The mitral valve had exhibited the existing ring in place.  The ring was well seated.  Color Doppler revealed only a trace central mitral regurgitant flow.  There was no evidence of masses or vegetations.  The aortic valve was a prosthetic bivalve with 3 leaflets, all opening and closing appropriately.  There were again no masses or vegetations.  Color Doppler revealed no aortic insufficiency.  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  The dilatation extended only a short distance as the aorta measured 3.6 cm at the level of the pulmonary artery.  The right heart examination was essentially normal with good contractility and normal size.   Tricuspid valve appearance was also normal with color Doppler revealing just a mild regurgitant flow and the Swan-Ganz catheter noted across the valve.  The patient was placed on cardiopulmonary bypass and underwent a resection and grafting of the aneurysmal aortic root at the completion of the bypass.  There was no significant air noted.  The post bypass examination revealed again left ventricle showing good contractility.  No inotropes.  Initially volume depletion was resolved with intravenous blood products.  The left atrium exhibited no significant change, inner atrial septum continued to be intact.  The mitral valve was again unchanged from prebypass examination showing only a trace central mitral regurgitant flow.  The bio-aortic valve had all leaflets continuing to open and close appropriately.  Color Doppler continued to show no aortic insufficiency.  The dilated aortic root was now absent.  Right heart examination was unchanged from the prebypass examination.  No other new significant findings were noted.          ______________________________ Bedelia Person, M.D.    LK/MEDQ  D:  10/30/2012  T:  10/31/2012  Job:  [id number]

## 2012-11-01 ENCOUNTER — Inpatient Hospital Stay (HOSPITAL_COMMUNITY): Payer: Medicare Other

## 2012-11-01 LAB — GLUCOSE, CAPILLARY
Glucose-Capillary: 149 mg/dL — ABNORMAL HIGH (ref 70–99)
Glucose-Capillary: 165 mg/dL — ABNORMAL HIGH (ref 70–99)
Glucose-Capillary: 110 mg/dL — ABNORMAL HIGH (ref 70–99)

## 2012-11-01 LAB — CBC
Hemoglobin: 9.4 g/dL — ABNORMAL LOW (ref 13.0–17.0)
MCH: 31 pg (ref 26.0–34.0)
MCHC: 35.5 g/dL (ref 30.0–36.0)
Platelets: 116 10*3/uL — ABNORMAL LOW (ref 150–400)
RDW: 15.1 % (ref 11.5–15.5)

## 2012-11-01 LAB — BASIC METABOLIC PANEL
BUN: 30 mg/dL — ABNORMAL HIGH (ref 6–23)
Calcium: 8.2 mg/dL — ABNORMAL LOW (ref 8.4–10.5)
Creatinine, Ser: 1.27 mg/dL (ref 0.50–1.35)
GFR calc non Af Amer: 56 mL/min — ABNORMAL LOW (ref >90)
Glucose, Bld: 145 mg/dL — ABNORMAL HIGH (ref 70–99)

## 2012-11-01 MED ORDER — POTASSIUM CHLORIDE CRYS ER 20 MEQ PO TBCR
20.0000 meq | EXTENDED_RELEASE_TABLET | Freq: Every day | ORAL | Status: DC
  Administered 2012-11-01 – 2012-11-02 (×2): 20 meq via ORAL
  Filled 2012-11-01 (×3): qty 1

## 2012-11-01 MED ORDER — FUROSEMIDE 40 MG PO TABS
40.0000 mg | ORAL_TABLET | Freq: Every day | ORAL | Status: DC
  Administered 2012-11-01 – 2012-11-02 (×2): 40 mg via ORAL
  Filled 2012-11-01 (×3): qty 1

## 2012-11-01 NOTE — Progress Notes (Signed)
Pts SBP 70s-80s.  Neo gtt restarted.  Will monitor pt.

## 2012-11-01 NOTE — Progress Notes (Signed)
Pt sating 87% while sleeping.  Pt placed on 1 L [place].  Sats now 98%.  Will monitor pt.

## 2012-11-01 NOTE — Progress Notes (Signed)
2 Days Post-Op Procedure(s) (LRB): REDO STERNOTOMY (N/A) REDO AORTIC VALVE REPLACEMENT (AVR) (N/A) BENTALL PROCEDURE (N/A) REPLACEMENT ASCENDING AORTA Subjective: No complaints  Objective: Vital signs in last 24 hours: Temp:  [97.7 F (36.5 C)-98.9 F (37.2 C)] 98 F (36.7 C) (08/02 0803) Pulse Rate:  [88-94] 94 (08/02 1045) Cardiac Rhythm:  [-] Atrial paced (08/02 1000) Resp:  [0-27] 18 (08/02 1045) BP: (79-132)/(45-80) 107/68 mmHg (08/02 1045) SpO2:  [93 %-100 %] 95 % (08/02 1045) Arterial Line BP: (158)/(78) 158/78 mmHg (08/01 1200) Weight:  [102.4 kg (225 lb 12 oz)-104.2 kg (229 lb 11.5 oz)] 102.4 kg (225 lb 12 oz) (08/02 0600)  Hemodynamic parameters for last 24 hours:    Intake/Output from previous day: 08/01 0701 - 08/02 0700 In: 1592.8 [P.O.:840; I.V.:652.8; IV Piggyback:100] Out: 1300 [Urine:990; Emesis/NG output:300; Chest Tube:10] Intake/Output this shift: Total I/O In: 31.3 [I.V.:31.3] Out: 20 [Urine:20]  General appearance: alert and cooperative Neurologic: intact Heart: regular rate and rhythm, S1, S2 normal, no murmur, click, rub or gallop Lungs: clear to auscultation bilaterally Extremities: edema mild Wound: incision ok  Lab Results:  Recent Labs  10/31/12 1625 10/31/12 1646 11/01/12 0400  WBC 7.6  --  9.4  HGB 10.0* 9.5* 9.4*  HCT 28.5* 28.0* 26.5*  PLT 109*  --  116*   BMET:  Recent Labs  10/31/12 0415  10/31/12 1646 11/01/12 0400  NA 139  < > 143 139  K 4.1  < > 3.9 3.8  CL 108  < > 110 108  CO2 23  --   --  25  GLUCOSE 166*  < > 145* 145*  BUN 27*  < > 26* 30*  CREATININE 1.39*  < > 1.30 1.27  CALCIUM 7.6*  --   --  8.2*  < > = values in this interval not displayed.  PT/INR:  Recent Labs  10/30/12 1735  LABPROT 11.2*  INR 0.82   ABG    Component Value Date/Time   PHART 7.406 10/31/2012 1009   HCO3 24.6* 10/31/2012 1009   TCO2 22 10/31/2012 1646   ACIDBASEDEF 3.0* 10/31/2012 0412   O2SAT 96.0 10/31/2012 1009   CBG (last 3)    Recent Labs  10/31/12 2332 11/01/12 0404 11/01/12 0740  GLUCAP 150* 149* 139*    Assessment/Plan: S/P Procedure(s) (LRB): REDO STERNOTOMY (N/A) REDO AORTIC VALVE REPLACEMENT (AVR) (N/A) BENTALL PROCEDURE (N/A) REPLACEMENT ASCENDING AORTA He is hemodynamically stable this am and neo weaned off. Mobilize Diuresis Continue foley due to urinary output monitoring   LOS: 2 days    BARTLE,BRYAN K 11/01/2012

## 2012-11-01 NOTE — Progress Notes (Signed)
Patient ID: Thomas Henderson, male   DOB: Oct 14, 1940, 73 y.o.   MRN: [id number]  SICU Evening Rounds:  Hemodynamically stable Sinus 100  Urine output ok No new problems today.

## 2012-11-02 LAB — GLUCOSE, CAPILLARY
Glucose-Capillary: 102 mg/dL — ABNORMAL HIGH (ref 70–99)
Glucose-Capillary: 114 mg/dL — ABNORMAL HIGH (ref 70–99)

## 2012-11-02 NOTE — Progress Notes (Signed)
Pt foley D/C'd per MD order and protocol, balloon deflated and catheter removed without difficulty and intact; Pt advised to use urinal for future voids, states understanding.

## 2012-11-02 NOTE — Progress Notes (Signed)
3 Days Post-Op Procedure(s) (LRB): REDO STERNOTOMY (N/A) REDO AORTIC VALVE REPLACEMENT (AVR) (N/A) BENTALL PROCEDURE (N/A) REPLACEMENT ASCENDING AORTA Subjective: No complaints  Objective: Vital signs in last 24 hours: Temp:  [98.2 F (36.8 C)-99.3 F (37.4 C)] 99 F (37.2 C) (08/03 0847) Pulse Rate:  [90-109] 102 (08/03 0800) Cardiac Rhythm:  [-] Sinus tachycardia (08/03 0800) Resp:  [12-29] 21 (08/03 0800) BP: (94-138)/(46-79) 138/75 mmHg (08/03 0800) SpO2:  [94 %-98 %] 97 % (08/03 0800) Weight:  [101.3 kg (223 lb 5.2 oz)] 101.3 kg (223 lb 5.2 oz) (08/03 0600)  Hemodynamic parameters for last 24 hours:    Intake/Output from previous day: 08/02 0701 - 08/03 0700 In: 1188.6 [P.O.:1000; I.V.:188.6] Out: 915 [Urine:915] Intake/Output this shift:    General appearance: alert and cooperative Neurologic: intact Heart: regular rate and rhythm, S1, S2 normal, no murmur, click, rub or gallop Lungs: clear to auscultation bilaterally Extremities: edema mild Wound: incision ok  Lab Results:  Recent Labs  10/31/12 1625 10/31/12 1646 11/01/12 0400  WBC 7.6  --  9.4  HGB 10.0* 9.5* 9.4*  HCT 28.5* 28.0* 26.5*  PLT 109*  --  116*   BMET:  Recent Labs  10/31/12 0415  10/31/12 1646 11/01/12 0400  NA 139  < > 143 139  K 4.1  < > 3.9 3.8  CL 108  < > 110 108  CO2 23  --   --  25  GLUCOSE 166*  < > 145* 145*  BUN 27*  < > 26* 30*  CREATININE 1.39*  < > 1.30 1.27  CALCIUM 7.6*  --   --  8.2*  < > = values in this interval not displayed.  PT/INR:  Recent Labs  10/30/12 1735  LABPROT 11.2*  INR 0.82   ABG    Component Value Date/Time   PHART 7.406 10/31/2012 1009   HCO3 24.6* 10/31/2012 1009   TCO2 22 10/31/2012 1646   ACIDBASEDEF 3.0* 10/31/2012 0412   O2SAT 96.0 10/31/2012 1009   CBG (last 3)   Recent Labs  11/02/12 0022 11/02/12 0420 11/02/12 0803  GLUCAP 122* 118* 109*    Assessment/Plan: S/P Procedure(s) (LRB): REDO STERNOTOMY (N/A) REDO AORTIC VALVE  REPLACEMENT (AVR) (N/A) BENTALL PROCEDURE (N/A) REPLACEMENT ASCENDING AORTA Do not plan to use coumadin with tissue valve Mobilize Diuresis Diabetes control: HgbA1C was 5.6 preop. Will stop CBG's since glucose appears under control. Transfer to 2000  LOS: 3 days    Thomas Henderson K 11/02/2012

## 2012-11-03 ENCOUNTER — Encounter (HOSPITAL_COMMUNITY): Payer: Self-pay | Admitting: Thoracic Surgery (Cardiothoracic Vascular Surgery)

## 2012-11-03 LAB — TYPE AND SCREEN
ABO/RH(D): O POS
Unit division: 0
Unit division: 0
Unit division: 0
Unit division: 0

## 2012-11-03 LAB — BASIC METABOLIC PANEL
BUN: 30 mg/dL — ABNORMAL HIGH (ref 6–23)
Creatinine, Ser: 1.04 mg/dL (ref 0.50–1.35)
GFR calc Af Amer: 82 mL/min — ABNORMAL LOW (ref >90)
GFR calc non Af Amer: 71 mL/min — ABNORMAL LOW (ref >90)

## 2012-11-03 MED ORDER — TRAMADOL HCL 50 MG PO TABS
50.0000 mg | ORAL_TABLET | ORAL | Status: DC | PRN
  Administered 2012-11-05 – 2012-11-06 (×4): 100 mg via ORAL
  Filled 2012-11-03 (×4): qty 2

## 2012-11-03 MED ORDER — BISACODYL 10 MG RE SUPP: 10.0000 mg | Freq: Every day | RECTAL | Status: DC | PRN

## 2012-11-03 MED ORDER — FAMOTIDINE 20 MG PO TABS
20.0000 mg | ORAL_TABLET | Freq: Two times a day (BID) | ORAL | Status: DC
  Administered 2012-11-03 – 2012-11-09 (×14): 20 mg via ORAL
  Filled 2012-11-03 (×16): qty 1

## 2012-11-03 MED ORDER — POTASSIUM CHLORIDE CRYS ER 20 MEQ PO TBCR
20.0000 meq | EXTENDED_RELEASE_TABLET | Freq: Every day | ORAL | Status: DC
  Administered 2012-11-03 – 2012-11-10 (×8): 20 meq via ORAL
  Filled 2012-11-03 (×7): qty 1

## 2012-11-03 MED ORDER — ONDANSETRON HCL 4 MG/2ML IJ SOLN: 4.0000 mg | Freq: Four times a day (QID) | INTRAMUSCULAR | Status: DC | PRN

## 2012-11-03 MED ORDER — ASPIRIN EC 325 MG PO TBEC
325.0000 mg | DELAYED_RELEASE_TABLET | Freq: Every day | ORAL | Status: DC
  Administered 2012-11-03 – 2012-11-09 (×7): 325 mg via ORAL
  Filled 2012-11-03 (×8): qty 1

## 2012-11-03 MED ORDER — SODIUM CHLORIDE 0.9 % IJ SOLN
3.0000 mL | Freq: Two times a day (BID) | INTRAMUSCULAR | Status: DC
  Administered 2012-11-03 – 2012-11-07 (×8): 3 mL via INTRAVENOUS
  Administered 2012-11-08: 12:00:00 via INTRAVENOUS
  Administered 2012-11-08 – 2012-11-09 (×3): 3 mL via INTRAVENOUS

## 2012-11-03 MED ORDER — ONDANSETRON HCL 4 MG PO TABS: 4.0000 mg | ORAL_TABLET | Freq: Four times a day (QID) | ORAL | Status: DC | PRN

## 2012-11-03 MED ORDER — SODIUM CHLORIDE 0.9 % IV SOLN: 250.0000 mL | INTRAVENOUS | Status: DC | PRN

## 2012-11-03 MED ORDER — OXYCODONE HCL 5 MG PO TABS
5.0000 mg | ORAL_TABLET | ORAL | Status: DC | PRN
  Administered 2012-11-07: 10 mg via ORAL
  Filled 2012-11-03: qty 2

## 2012-11-03 MED ORDER — ACETAMINOPHEN 325 MG PO TABS
650.0000 mg | ORAL_TABLET | Freq: Four times a day (QID) | ORAL | Status: DC | PRN
  Administered 2012-11-06: 650 mg via ORAL
  Filled 2012-11-03: qty 2

## 2012-11-03 MED ORDER — FUROSEMIDE 40 MG PO TABS
40.0000 mg | ORAL_TABLET | Freq: Every day | ORAL | Status: AC
  Administered 2012-11-03 – 2012-11-10 (×8): 40 mg via ORAL
  Filled 2012-11-03 (×7): qty 1

## 2012-11-03 MED ORDER — BISACODYL 5 MG PO TBEC: 10.0000 mg | DELAYED_RELEASE_TABLET | Freq: Every day | ORAL | Status: DC | PRN

## 2012-11-03 MED ORDER — DOCUSATE SODIUM 100 MG PO CAPS
200.0000 mg | ORAL_CAPSULE | Freq: Every day | ORAL | Status: DC
  Administered 2012-11-03 – 2012-11-10 (×8): 200 mg via ORAL
  Filled 2012-11-03 (×8): qty 2

## 2012-11-03 MED ORDER — METOPROLOL TARTRATE 25 MG PO TABS
25.0000 mg | ORAL_TABLET | Freq: Two times a day (BID) | ORAL | Status: DC
  Administered 2012-11-03 (×2): 25 mg via ORAL
  Filled 2012-11-03 (×4): qty 1

## 2012-11-03 MED ORDER — MOVING RIGHT ALONG BOOK
Freq: Once | Status: AC
  Administered 2012-11-03: 10:00:00
  Filled 2012-11-03: qty 1

## 2012-11-03 MED ORDER — SODIUM CHLORIDE 0.9 % IJ SOLN: 3.0000 mL | INTRAMUSCULAR | Status: DC | PRN

## 2012-11-03 NOTE — Plan of Care (Signed)
Problem: Phase III Progression Outcomes Goal: Time patient transferred to PCTU/Telemetry POD Outcome: Completed/Met Date Met:  11/03/12 Pt transferred to [facility] bed 2017 at  0930.

## 2012-11-03 NOTE — Progress Notes (Signed)
Report to 2000 RN

## 2012-11-03 NOTE — Progress Notes (Signed)
4 Days Post-Op Procedure(s) (LRB): REDO STERNOTOMY (N/A) REDO AORTIC VALVE REPLACEMENT (AVR) (N/A) BENTALL PROCEDURE (N/A) REPLACEMENT ASCENDING AORTA Subjective: Feels well this AM Ambulating without difficulty Pain well controlled  Objective: Vital signs in last 24 hours: Temp:  [98.8 F (37.1 C)-99.2 F (37.3 C)] 98.8 F (37.1 C) (08/04 0742) Pulse Rate:  [102-120] 120 (08/04 0800) Cardiac Rhythm:  [-] Sinus tachycardia (08/04 0736) Resp:  [14-27] 27 (08/04 0800) BP: (108-128)/(68-84) 108/68 mmHg (08/04 0930) SpO2:  [96 %-99 %] 99 % (08/04 0930) Weight:  [220 lb 14.4 oz (100.2 kg)] 220 lb 14.4 oz (100.2 kg) (08/04 0600)  Hemodynamic parameters for last 24 hours:    Intake/Output from previous day: 08/03 0701 - 08/04 0700 In: 623 [P.O.:620; I.V.:3] Out: 150 [Urine:150] Intake/Output this shift: Total I/O In: 240 [P.O.:240] Out: 200 [Urine:200]  General appearance: alert and no distress Heart: regular rate and rhythm Lungs: diminished breath sounds bibasilar Wound: clean and dry  Lab Results:  Recent Labs  10/31/12 1625 10/31/12 1646 11/01/12 0400  WBC 7.6  --  9.4  HGB 10.0* 9.5* 9.4*  HCT 28.5* 28.0* 26.5*  PLT 109*  --  116*   BMET:  Recent Labs  11/01/12 0400 11/03/12 0830  NA 139 141  K 3.8 3.9  CL 108 106  CO2 25 22  GLUCOSE 145* 123*  BUN 30* 30*  CREATININE 1.27 1.04  CALCIUM 8.2* 9.0    PT/INR: No results found for this basename: LABPROT, INR,  in the last 72 hours ABG    Component Value Date/Time   PHART 7.406 10/31/2012 1009   HCO3 24.6* 10/31/2012 1009   TCO2 22 10/31/2012 1646   ACIDBASEDEF 3.0* 10/31/2012 0412   O2SAT 96.0 10/31/2012 1009   CBG (last 3)   Recent Labs  11/02/12 0803 11/02/12 1225 11/02/12 1550  GLUCAP 109* 102* 114*    Assessment/Plan: S/P Procedure(s) (LRB): REDO STERNOTOMY (N/A) REDO AORTIC VALVE REPLACEMENT (AVR) (N/A) BENTALL PROCEDURE (N/A) REPLACEMENT ASCENDING AORTA POD # 4 [place]/ redo  AVR  Overall doing well In sinus rhythm, a little tachycardic at times Continue diuresis CBG well controlled   LOS: 4 days    Kilyn Maragh C 11/03/2012

## 2012-11-03 NOTE — Progress Notes (Signed)
1610 attempted to call report to 2000

## 2012-11-03 NOTE — Progress Notes (Signed)
1914 transferred to 2017 via monitor and WC( with SCD's), placed in chair, RN to receive in room

## 2012-11-03 NOTE — Plan of Care (Signed)
Problem: Phase III Progression Outcomes Goal: Transfer to PCTU/Telemetry POD Outcome: Completed/Met Date Met:  11/03/12 Orders written for PTCU, No bed available on 11-02-12

## 2012-11-03 NOTE — Progress Notes (Signed)
CARDIAC REHAB PHASE I   PRE:  Rate/Rhythm: 108 ST  BP:  Supine:   Sitting: 113/71  Standing:    SaO2: 96%RA  MODE:  Ambulation: 350 ft   POST:  Rate/Rhythm: 157 ST, to 106 with rest  BP:  Supine:   Sitting: 136/77  Standing:    SaO2: 94%RA 1340-1412 Pt walked 350 ft on RA with hand held asst. Gait steady. Heart rate up with walking. Pt denied palpitations. To recliner after walk. Heart rate down with rest.    Luetta Nutting, RN BSN  11/03/2012 2:09 PM

## 2012-11-04 MED ORDER — METOPROLOL TARTRATE 25 MG PO TABS
37.5000 mg | ORAL_TABLET | Freq: Two times a day (BID) | ORAL | Status: DC
  Administered 2012-11-04 (×2): 37.5 mg via ORAL
  Filled 2012-11-04 (×4): qty 1

## 2012-11-04 NOTE — Progress Notes (Signed)
Ambulated 400 ft,using rolling walker on room air tolerated well,heart rate stays at 108 post ambulation.

## 2012-11-04 NOTE — Progress Notes (Addendum)
      [address]       [place] [address]             [phone]      5 Days Post-Op  Procedure(s) (LRB): REDO STERNOTOMY (N/A) REDO AORTIC VALVE REPLACEMENT (AVR) (N/A) BENTALL PROCEDURE (N/A) REPLACEMENT ASCENDING AORTA Subjective: Feels ok  Objective  Telemetry sinus tachy  Temp:  [99.3 F (37.4 C)-99.9 F (37.7 C)] 99.5 F (37.5 C) (08/05 0417) Pulse Rate:  [98-108] 108 (08/05 0417) Resp:  [18-20] 18 (08/05 0417) BP: (108-142)/(68-89) 142/89 mmHg (08/05 0417) SpO2:  [98 %-100 %] 100 % (08/05 0417) Weight:  [220 lb 10.9 oz (100.1 kg)] 220 lb 10.9 oz (100.1 kg) (08/05 0417)   Intake/Output Summary (Last 24 hours) at 11/04/12 0828 Last data filed at 11/04/12 0803  Gross per 24 hour  Intake    850 ml  Output    550 ml  Net    300 ml       General appearance: alert, cooperative and no distress Heart: regular rate and rhythm Lungs: dim in bases Abdomen: benign Extremities: minor edema Wound: incisions healing well  Lab Results:  Recent Labs  11/03/12 0830  NA 141  K 3.9  CL 106  CO2 22  GLUCOSE 123*  BUN 30*  CREATININE 1.04  CALCIUM 9.0   No results found for this basename: AST, ALT, ALKPHOS, BILITOT, PROT, ALBUMIN,  in the last 72 hours No results found for this basename: LIPASE, AMYLASE,  in the last 72 hours No results found for this basename: WBC, NEUTROABS, HGB, HCT, MCV, PLT,  in the last 72 hours No results found for this basename: CKTOTAL, CKMB, TROPONINI,  in the last 72 hours No components found with this basename: POCBNP,  No results found for this basename: DDIMER,  in the last 72 hours No results found for this basename: HGBA1C,  in the last 72 hours No results found for this basename: CHOL, HDL, LDLCALC, TRIG, CHOLHDL,  in the last 72 hours No results found for this basename: TSH, T4TOTAL, FREET3, T3FREE, THYROIDAB,  in the last 72 hours No results found for this basename: VITAMINB12, FOLATE, FERRITIN, TIBC,  IRON, RETICCTPCT,  in the last 72 hours  Medications: Scheduled . aspirin EC  325 mg Oral Daily  . docusate sodium  200 mg Oral Daily  . famotidine  20 mg Oral BID  . furosemide  40 mg Oral Daily  . metoprolol tartrate  25 mg Oral BID  . potassium chloride  20 mEq Oral Daily  . sodium chloride  3 mL Intravenous Q12H     Radiology/Studies:  No results found.  INR: Will add last result for INR, ABG once components are confirmed Will add last 4 CBG results once components are confirmed  Assessment/Plan: S/P Procedure(s) (LRB): REDO STERNOTOMY (N/A) REDO AORTIC VALVE REPLACEMENT (AVR) (N/A) BENTALL PROCEDURE (N/A) REPLACEMENT ASCENDING AORTA  1 doing well overall 2 sinus tachy, will increase beta blocker  3 cont gentle diuresis 4 some sputum production- clear, cont pulm toilet 5 cont rehab   LOS: 5 days    GOLD,WAYNE E 8/5/20148:28 AM  Mild edema Incisions clean patient examined and medical record reviewed,agree with above note. VAN TRIGT III,PETER 11/04/2012

## 2012-11-04 NOTE — Progress Notes (Signed)
CARDIAC REHAB PHASE I   PRE:  Rate/Rhythm: 105ST  BP:  Supine:   Sitting: 122/76  Standing:    SaO2: 98%RA  MODE:  Ambulation: 550 ft   POST:  Rate/Rhythm: 145ST, 112 ST with rest  BP:  Supine:   Sitting: 141/82  Standing:    SaO2: 94%RA 1322-1342 Pt walked 550 ft on RA with asst x 1 with steady gait. Tolerated well except elevated HR. Pt denied palpitations. To recliner after walk. HR back down with rest.   Luetta Nutting, RN BSN  11/04/2012 1:40 PM

## 2012-11-04 NOTE — Care Management Note (Unsigned)
    Page 1 of 1   11/07/2012     3:46:48 PM   CARE MANAGEMENT NOTE 11/07/2012  Patient:  Thomas Henderson, Thomas Henderson   Account Number:  [id number]  Date Initiated:  11/04/2012  Documentation initiated by:  Trask Vosler  Subjective/Objective Assessment:   PT S/P BENTALL PROCEDURE ON 10/30/12.  PTA, PT INDEPENDENT, LIVES WITH SPOUSE.     Action/Plan:   WILL FOLLOW FOR HOME NEEDS AS PT PROGRESSES.   Anticipated DC Date:  11/06/2012   Anticipated DC Plan:  HOME W HOME HEALTH SERVICES      DC Planning Services  CM consult      Choice offered to / List presented to:             Status of service:  In process, will continue to follow Medicare Important Message given?   (If response is "NO", the following Medicare IM given date fields will be blank) Date Medicare IM given:   Date Additional Medicare IM given:    Discharge Disposition:    Per UR Regulation:  Reviewed for med. necessity/level of care/duration of stay  If discussed at Long Length of Stay Meetings, dates discussed:   11/06/2012    Comments:  11/07/12 Eloyce Bultman,RN,BSN [phone] PT WITH SEVERE KNEE PAIN LIMITING MOBILITY; P.T. CONSULT THIS AM; RECOMMENDATION IS FOR HHPT AT DC.  WILL CONT TO FOLLOW/ARRANGE SERVICES AS ORDERED.

## 2012-11-05 ENCOUNTER — Other Ambulatory Visit: Payer: Self-pay | Admitting: *Deleted

## 2012-11-05 DIAGNOSIS — I712 Thoracic aortic aneurysm, without rupture: Secondary | ICD-10-CM

## 2012-11-05 LAB — CBC
MCHC: 34 g/dL (ref 30.0–36.0)
RDW: 15.1 % (ref 11.5–15.5)

## 2012-11-05 MED ORDER — METOPROLOL TARTRATE 50 MG PO TABS
50.0000 mg | ORAL_TABLET | Freq: Two times a day (BID) | ORAL | Status: DC
  Administered 2012-11-05 – 2012-11-06 (×4): 50 mg via ORAL
  Filled 2012-11-05 (×6): qty 1

## 2012-11-05 NOTE — Discharge Summary (Addendum)
Physician Discharge Summary  Patient ID: Thomas Henderson MRN: [id number] DOB/AGE: Nov 21, 1940 72 y.o.  Admit date: 10/30/2012 Discharge date: 11/05/2012  Admission Diagnoses:  Patient Active Problem List   Diagnosis Date Noted  . DYSLIPIDEMIA 06/10/2008  . ERECTILE DYSFUNCTION 06/10/2008  . HYPERTENSION 06/10/2008  . VALVULAR HEART DISEASE 06/10/2008  . CHF 06/10/2008  . TONSILLECTOMY, HX OF 06/10/2008  . S/P aortic valve replacement with bioprosthetic valve 08/13/2006  . S/P mitral valve repair 08/13/2006   Discharge Diagnoses:   Patient Active Problem List   Diagnosis Date Noted  . S/P ascending aortic aneurysm repair 10/30/2012  . DYSLIPIDEMIA 06/10/2008  . ERECTILE DYSFUNCTION 06/10/2008  . HYPERTENSION 06/10/2008  . VALVULAR HEART DISEASE 06/10/2008  . CHF 06/10/2008  . TONSILLECTOMY, HX OF 06/10/2008  . S/P aortic valve replacement with bioprosthetic valve 08/13/2006  . S/P mitral valve repair 08/13/2006   Discharged Condition: good  History of Present Illness:   Patient is a 72 yo male who returns for follow up of ascending thoracic aortic aneurysm. He originally underwent aortic valve replacement using a bioprosthetic tissue valve and mitral valve repair in 2008. At that time his proximal ascending aorta was only mildly dilated. Follow up echocardiograms suggested the interval increase in size of the patient's aortic root, which was confirmed on MRA of the thoracic aorta. The patient was seen by Dr Cornelius Moras in consultation on 09/07/2011 and has been following the patient closely ever since. [name] has previously remained reluctant to consider elective surgical repair despite concerns regarding the risk of aortic dissection or rupture. However, his aneurysm has continued to enlarge over time and he now desires to proceed with elective repair. He has remained clinically stable and he specifically denies any pain in his chest or back that might be any way related to his ascending  thoracic aortic aneurysm. He reports stable symptoms of chronic exertional shortness of breath and fatigue. He continues to work and overall he is getting along fairly well.  His most recent follow up with Dr. Cornelius Moras was on 10/17/2012 at which time the risks and benefits of the procedure were explained tot he patient.  He was agreeable to proceed.    Hospital Course:   The patient presented to [facility] on 10/30/2012.  He was taken to the operating room and underwent Redo Sternotomy, Repair of Ascending Thoracic Aortic Aneurysm using a Biological Bentall Aortic Root Replacement with Re-implantation of the left main and right coronary artery.  This was done utilizing a 25 mm [facility] Ease Pericardial tissue valve and a 28 mm Terumo Gelweave Valsalva Aortic Root Conduit.  The patient tolerated the procedure well and was taken to the SICU in stable condition.  During his stay in the ICU the patient was weaned to extubate on POD #1.  He was weaned off all cardiac drips.  His chest tubes and wires were removed without difficulty.  The patient was diuresed for hypervolemia.  He is maintaining NSR.  Once medically stable he was transferred to the step down unit in stable condition.  The patient continues to progress.  He has been experiencing Sinus Tachycardia and his Beta Blocker was increased.  His pacing wires were removed without difficulty. He did develop left knee pain with effusion and was seen in consultation by orthopedics.  A left knee arthrocentesis was performed on 11/06/2012, yielding 40 ml straw colored fluid.  All cultures were negative, and fluid analysis revealed inflammatory arthropathy, with calcium pyrophosphate crystals suggestive of  pseudogout.  He was treated with a course of NSAIDS, and has shown progressive symptomatic improvement.  He is otherwise progressing well  He has been evaluated on today's date and is medically stable for discharge.        Significant Diagnostic Studies:     CTA Chest: Stable aneurysmal dilatation of the ascending aorta status post  prosthetic graft placement. Maximal diameter of the ascending  aorta based on my measuring technique is 6.5 cm and is without  change.  Vertebral arteries are not clearly opacified or visualized on this  study.  Stable left upper lobe irregular pulmonary parenchymal density.  Stability over 2 years supports benign etiology. Continued  follow up at the 72-month interval is recommended.  Cholelithiasis.  Echocardiogram:   - Left ventricle: The cavity size was normal. Wall thickness was normal. Systolic function was normal. The estimated ejection fraction was in the range of 60% to 65%. Wall motion was normal; there were no regional wall motion abnormalities. Indeterminant diastolic function. - Aortic valve: Bioprosthetic aortic valve. No significant bioprosthetic valve stenosis. No regurgitation. Mean gradient: 8mm Hg (S). - Aorta: Severely dilated aortic root and ascending aorta. Aortic root diameter: 62 mm. Ascending aortic diameter: 56mm (S). - Mitral valve: Bioprosthetic mitral valve. No significant stenosis. No significant regurgitation. - Left atrium: The atrium was mildly dilated. - Right ventricle: The cavity size was mildly to moderately dilated. Systolic function was normal. - Right atrium: The atrium was mildly dilated. - Pulmonary arteries: No complete TR doppler jet so unable to estimate PA systolic pressure. - Inferior vena cava: The vessel was normal in size; the respirophasic diameter changes were in the normal range (= 50%); findings are consistent with normal central venous pressure.    Treatments: surgery:   Redo Median Sternotomy for Repair of Ascending Thoracic Aortic Aneurysm using Biological Bentall Aortic Root Replacement with reimplantation of left main and right coronary arteries  Edwards Magna Ease Pericardial Tissue Valve (size 25 mm, model # 3300TFX, serial # [id number])    Terumo Gelweave Valsalva aortic root conduit (size 28 mm, catalogue #[id number], serial #[id number])    Disposition: 01-Home or Self Care  Discharge medications:     Medication List    STOP taking these medications       aspirin 81 MG tablet  Replaced by:  aspirin 325 MG EC tablet     hydrochlorothiazide 25 MG tablet  Commonly known as:  HYDRODIURIL     lisinopril 40 MG tablet  Commonly known as:  PRINIVIL,ZESTRIL     metoprolol succinate 50 MG 24 hr tablet  Commonly known as:  TOPROL-XL      TAKE these medications       aspirin 325 MG EC tablet  Take 1 tablet (325 mg total) by mouth daily.     furosemide 40 MG tablet  Commonly known as:  LASIX  Take 1 tablet (40 mg total) by mouth daily. X 1 week     gabapentin 300 MG capsule  Commonly known as:  NEURONTIN  Take 300 mg by mouth daily.     metoprolol 50 MG tablet  Commonly known as:  LOPRESSOR  Take 1.5 tablets (75 mg total) by mouth 2 (two) times daily.     niacin 500 MG tablet  Take 500 mg by mouth daily with breakfast.     oxyCODONE 5 MG immediate release tablet  Commonly known as:  Oxy IR/ROXICODONE  Take 1-2 tablets (5-10 mg total) by mouth every 3 (three) hours  as needed for pain.     pantoprazole 40 MG tablet  Commonly known as:  PROTONIX  Take 40 mg by mouth daily.     potassium chloride SA 20 MEQ tablet  Commonly known as:  K-DUR,KLOR-CON  Take 1 tablet (20 mEq total) by mouth daily. X 1 week     simvastatin 20 MG tablet  Commonly known as:  ZOCOR  Take 20 mg by mouth at bedtime.       The patient has been discharged on:   1.Beta Blocker:  Yes [ x  ]                              No   [   ]                              If No, reason:  2.Ace Inhibitor/ARB: Yes [   ]                                     No  [  x  ]                                     If No, reason: Mild Creatinine Elevation, No CAD, Preserved EF  3.Statin:   Yes [   ]                  No  [ x  ]                  If  No, reason: No CAD  4.Marlowe KaysValentino Hue  [ x  ]                  No   [   ]                  If No, reason:      Future Appointments Provider Department Dept Phone   11/24/2012 4:00 PM Purcell Nails, MD [facility] [phone]      Follow-up Information   Follow up with Purcell Nails, MD On 11/24/2012. (Appointment is at 4:00)    Contact information:   [address] [employer] [address] [place] [address] [phone]       Follow up with [facility] On 11/24/2012. (Please get CXR at 3:00)    Contact information:   [place]       Schedule an appointment as soon as possible for a visit with Tonny Bollman, MD. (Please contact office to set up 2-4 week follow up)    Contact information:   [address] [place] [address] [phone]       Follow up with Budd Palmer, MD. Schedule an appointment as soon as possible for a visit in 4 weeks. (call for appointment)    Contact information:   [address] [place] [address] [phone]       Signed: Lowella Dandy 11/05/2012, 9:45 AM

## 2012-11-05 NOTE — Progress Notes (Signed)
CARDIAC REHAB PHASE I   PRE:  Rate/Rhythm: 101 ST    BP: sitting 131/87    SaO2: 97 RA  MODE:  Ambulation: 350 ft   POST:  Rate/Rhythm: 135 ST    BP: sitting 136/81     SaO2: 93 RA  Pt c/o left knee hurting today. Limped with walking and requested RW. HR up but lower than yesterday. To recliner after walk.  [id number]  Harriet Masson CES, ACSM 11/05/2012 9:47 AM

## 2012-11-05 NOTE — Progress Notes (Addendum)
      [address]       [place] [address]             [phone]      6 Days Post-Op  Procedure(s) (LRB): REDO STERNOTOMY (N/A) REDO AORTIC VALVE REPLACEMENT (AVR) (N/A) BENTALL PROCEDURE (N/A) REPLACEMENT ASCENDING AORTA Subjective: Feels somewhat weak with ambulation but improving  Objective  Telemetry sinus tachy  Temp:  [98.3 F (36.8 C)-100.3 F (37.9 C)] 98.3 F (36.8 C) (08/06 0420) Pulse Rate:  [105-115] 105 (08/06 0420) Resp:  [18-20] 20 (08/06 0420) BP: (122-141)/(75-82) 122/82 mmHg (08/06 0420) SpO2:  [96 %-98 %] 98 % (08/06 0420) Weight:  [218 lb 1.6 oz (98.93 kg)] 218 lb 1.6 oz (98.93 kg) (08/06 0420)   Intake/Output Summary (Last 24 hours) at 11/05/12 0809 Last data filed at 11/04/12 1946  Gross per 24 hour  Intake    240 ml  Output    400 ml  Net   -160 ml       General appearance: alert, cooperative and no distress Heart: regular rate and rhythm Lungs: min dim in bases Abdomen: soft, nontender, + BS Extremities: minoe edema Wound: incis healing well  Lab Results:  Recent Labs  11/03/12 0830  NA 141  K 3.9  CL 106  CO2 22  GLUCOSE 123*  BUN 30*  CREATININE 1.04  CALCIUM 9.0   No results found for this basename: AST, ALT, ALKPHOS, BILITOT, PROT, ALBUMIN,  in the last 72 hours No results found for this basename: LIPASE, AMYLASE,  in the last 72 hours No results found for this basename: WBC, NEUTROABS, HGB, HCT, MCV, PLT,  in the last 72 hours No results found for this basename: CKTOTAL, CKMB, TROPONINI,  in the last 72 hours No components found with this basename: POCBNP,  No results found for this basename: DDIMER,  in the last 72 hours No results found for this basename: HGBA1C,  in the last 72 hours No results found for this basename: CHOL, HDL, LDLCALC, TRIG, CHOLHDL,  in the last 72 hours No results found for this basename: TSH, T4TOTAL, FREET3, T3FREE, THYROIDAB,  in the last 72 hours No results found for  this basename: VITAMINB12, FOLATE, FERRITIN, TIBC, IRON, RETICCTPCT,  in the last 72 hours  Medications: Scheduled . aspirin EC  325 mg Oral Daily  . docusate sodium  200 mg Oral Daily  . famotidine  20 mg Oral BID  . furosemide  40 mg Oral Daily  . metoprolol tartrate  37.5 mg Oral BID  . potassium chloride  20 mEq Oral Daily  . sodium chloride  3 mL Intravenous Q12H     Radiology/Studies:  No results found.  INR: Will add last result for INR, ABG once components are confirmed Will add last 4 CBG results once components are confirmed  Assessment/Plan: S/P Procedure(s) (LRB): REDO STERNOTOMY (N/A) REDO AORTIC VALVE REPLACEMENT (AVR) (N/A) BENTALL PROCEDURE (N/A) REPLACEMENT ASCENDING AORTA  1 he conts to make good progress 2 cont pulm toilet/ rehab  3 cont gentle diuresis 4 d/c epw's 5 increase beta blocker to 50 bid, recheck cbc to make sure H/H stable 6 poss home 1-2 days    LOS: 6 days    GOLD,WAYNE E 8/6/20148:09 AM  Patient seen and examined, agree with above Continues to make progress Home soon

## 2012-11-05 NOTE — Progress Notes (Signed)
EPW x 2 DC'd per MD order and protocol.  Wire ends intact, no ectopy or bleeding noted, pt tolerated well.  Bedrest x 1 hr and frequent vitals initiated per protocol.  Will continue to monitor closely. Ave Filter

## 2012-11-06 ENCOUNTER — Inpatient Hospital Stay (HOSPITAL_COMMUNITY): Payer: Medicare Other

## 2012-11-06 LAB — BASIC METABOLIC PANEL
GFR calc Af Amer: >90 mL/min (ref >90)
GFR calc non Af Amer: 82 mL/min — ABNORMAL LOW (ref >90)
Potassium: 3.7 mEq/L (ref 3.5–5.1)
Sodium: 140 mEq/L (ref 135–145)

## 2012-11-06 LAB — SYNOVIAL CELL COUNT + DIFF, W/ CRYSTALS
Neutrophil, Synovial: 93 % — ABNORMAL HIGH (ref 0–25)
WBC, Synovial: 26529 /mm3 — ABNORMAL HIGH (ref 0–200)

## 2012-11-06 LAB — GRAM STAIN

## 2012-11-06 MED ORDER — METHYLPREDNISOLONE ACETATE 40 MG/ML IJ SUSP
40.0000 mg | Freq: Once | INTRAMUSCULAR | Status: DC
  Filled 2012-11-06: qty 1

## 2012-11-06 MED ORDER — LIDOCAINE HCL 2 % IJ SOLN
20.0000 mL | Freq: Once | INTRAMUSCULAR | Status: AC
  Administered 2012-11-06: 400 mg
  Filled 2012-11-06: qty 20

## 2012-11-06 MED ORDER — KETOROLAC TROMETHAMINE 15 MG/ML IJ SOLN
15.0000 mg | Freq: Four times a day (QID) | INTRAMUSCULAR | Status: DC | PRN
  Filled 2012-11-06: qty 1

## 2012-11-06 MED ORDER — BUPIVACAINE HCL 0.25 % IJ SOLN
30.0000 mL | Freq: Once | INTRAMUSCULAR | Status: AC
  Administered 2012-11-06: 30 mL
  Filled 2012-11-06: qty 30

## 2012-11-06 NOTE — Progress Notes (Signed)
Pt in too much knee pain to ambulate. Had recently gotten to recliner with RN/NT assist. Ed completed with wife present. Interested in [facility] in [place] and will send referral there. Set up video. [id number] Ethelda Chick CES, ACSM 3:11 PM 11/06/2012

## 2012-11-06 NOTE — Progress Notes (Signed)
Pt refused to walk tonight,he is complaining of pain in his left knee on ambulation.

## 2012-11-06 NOTE — Procedures (Signed)
Clinician: Mearl Latin, PA-C  Specimen: 40 cc straw colored synovial fluid and bloody fluid  Complications: none  Intra-articular medications: 2cc 2% lidocaine plain, 4 cc 0.25 % marcaine, No intra-articular steroids infiltrated skin with 3cc 2% lidocaine  Description  Due to extensive retained foreign bodies in the lateral soft tissue as well as extensive degenerative changes in all 3 compartments a medial approach was selected. Utilizing sterile technique the medial joint space was identified and the Left knee was prepped with alcohol swab, followed by betadine swabs x 3.  The SQ tissue was then infiltrated with 3 cc of 2% lidocaine (plain).  After adequate anesthesia of the skin was achieved, an 18 G needle on a 20 cc syringe was then advanced into the knee joint proximal to the the patellar tendon and parallel to the tibial plateau joint surface. A palpable pop was appreciated upon entrance to the knee. Initially 10 cc of clear straw colored was aspirated from the joint. After this fluid return was limited, the needle was re-angled and palpable give was appreciated again, approximately 5 cc's more of straw colored fluid was obtained and then became blood tinged. Total of 40cc of fluid was aspirated.  Based on the clinical appearance of the fluid, a septic joint is a very low probability, injected additional medications into the knee joint which included lidocaine and marcaine as noted above.  Needle removed without difficulty, dressing applied, including ACE wrap.  Pt tolerated procedure well.     Specimen will be sent to lab for gram stain, culture (aerobic and anaerobic), fluid cell count with differential and crystal analysis.  Pt can participate with therapies as he can tolerate. WBAT, no ROM restrictions. Ice and elevate. Will follow up on labs to further guide treatment  Suspect blood tinged fluid related to possible synovitis.  Pt will need outpt follow up with ortho for discussion of  possible total knee. Pt has complex medical issues that put him at a higher risk for this orthopaedic procedure   Mearl Latin, PA-C [facility] [phone] (P)
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
## 2012-11-06 NOTE — Progress Notes (Deleted)
Pt refused to have another walk tonight,he is complaining of pain in his left knee on ambulation.
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
## 2012-11-07 ENCOUNTER — Encounter (HOSPITAL_COMMUNITY): Payer: Self-pay | Admitting: Orthopedic Surgery

## 2012-11-07 DIAGNOSIS — S82142Q Displaced bicondylar fracture of left tibia, subsequent encounter for open fracture type I or II with malunion: Secondary | ICD-10-CM

## 2012-11-07 DIAGNOSIS — M11262 Other chondrocalcinosis, left knee: Secondary | ICD-10-CM

## 2012-11-07 DIAGNOSIS — M1712 Unilateral primary osteoarthritis, left knee: Secondary | ICD-10-CM

## 2012-11-07 HISTORY — DX: Unilateral primary osteoarthritis, left knee: M17.12

## 2012-11-07 HISTORY — DX: Other chondrocalcinosis, left knee: M11.262

## 2012-11-07 HISTORY — DX: Displaced bicondylar fracture of left tibia, subsequent encounter for open fracture type I or II with malunion: S82.142Q

## 2012-11-07 LAB — GLUCOSE, CAPILLARY: Glucose-Capillary: 103 mg/dL — ABNORMAL HIGH (ref 70–99)

## 2012-11-07 MED ORDER — METOPROLOL TARTRATE 50 MG PO TABS
75.0000 mg | ORAL_TABLET | Freq: Two times a day (BID) | ORAL | Status: DC
  Administered 2012-11-07 – 2012-11-09 (×6): 75 mg via ORAL
  Filled 2012-11-07 (×8): qty 1

## 2012-11-07 MED ORDER — KETOROLAC TROMETHAMINE 10 MG PO TABS
10.0000 mg | ORAL_TABLET | Freq: Four times a day (QID) | ORAL | Status: DC
  Administered 2012-11-07 – 2012-11-09 (×7): 10 mg via ORAL
  Filled 2012-11-07 (×16): qty 1

## 2012-11-07 NOTE — Progress Notes (Signed)
Chest tube sutures removed per order. 1/2 in steri-strips applied to suture sites. Patient tolerated well. Call bell near. Thomas Henderson
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
## 2012-11-07 NOTE — Progress Notes (Addendum)
       [address]       [place] [address]             [phone]          8 Days Post-Op Procedure(s) (LRB): REDO STERNOTOMY (N/A) REDO AORTIC VALVE REPLACEMENT (AVR) (N/A) BENTALL PROCEDURE (N/A) REPLACEMENT ASCENDING AORTA  Subjective: Less pain in left knee this am, but still unable to bear weight on that side.  Otherwise feels well.    Objective: Vital signs in last 24 hours: Patient Vitals for the past 24 hrs:  BP Temp Temp src Pulse Resp SpO2 Weight  11/07/12 0409 135/82 mmHg 97.7 F (36.5 C) Oral 90 20 98 % 215 lb 9.8 oz (97.8 kg)  11/06/12 2017 151/82 mmHg 101.5 F (38.6 C) Oral 109 20 96 % -  11/06/12 1319 132/75 mmHg 101 F (38.3 C) Oral 98 18 96 % -  11/06/12 0957 123/74 mmHg - - 117 - - -   Current Weight  11/07/12 215 lb 9.8 oz (97.8 kg)  PRE OP WEIGHT: 95 kg    Intake/Output from previous day: 08/07 0701 - 08/08 0700 In: 1080 [P.O.:1080] Out: 1200 [Urine:1200]    PHYSICAL EXAM:  Heart: RRR, tachy 110-120s Lungs: Slightly decreased in bases Wound: Clean and dry Extremities: L knee wrapped with ACE bandage, minimal LE edema    Lab Results: CBC: Recent Labs  11/05/12 0839  WBC 7.3  HGB 9.1*  HCT 26.8*  PLT 173   BMET:  Recent Labs  11/06/12 0540  NA 140  K 3.7  CL 104  CO2 26  GLUCOSE 116*  BUN 23  CREATININE 0.97  CALCIUM 8.9    PT/INR: No results found for this basename: LABPROT, INR,  in the last 72 hours    Assessment/Plan: S/P Procedure(s) (LRB): REDO STERNOTOMY (N/A) REDO AORTIC VALVE REPLACEMENT (AVR) (N/A) BENTALL PROCEDURE (N/A) REPLACEMENT ASCENDING AORTA CV- sinus tachy around 120 this am, BPs remain elevated.  Will increase beta blocker further and monitor.  Vol overload- diurese. L knee effusion- s/p arthrocentesis, Cx and gram stain negative.  Continue Toradol, care per ortho. Disp- hopefully home once ambulating better.   LOS: 8 days    [name],[name] 11/07/2012   Chart  reviewed, patient examined, agree with above.
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
## 2012-11-07 NOTE — Progress Notes (Signed)
[facility]  Subjective: pts knee feels much better Working with therapy  No fever and no chills    Objective: Vital signs in last 24 hours: Temp:  [97.7 F (36.5 C)-101.5 F (38.6 C)] 97.7 F (36.5 C) (08/08 0409) Pulse Rate:  [90-111] 111 (08/08 1019) Resp:  [18-20] 20 (08/08 0409) BP: (124-151)/(74-82) 124/74 mmHg (08/08 1019) SpO2:  [96 %-98 %] 98 % (08/08 0409) Weight:  [97.8 kg (215 lb 9.8 oz)] 97.8 kg (215 lb 9.8 oz) (08/08 0409)  Intake/Output from previous day: 08/07 0701 - 08/08 0700 In: 1080 [P.O.:1080] Out: 1200 [Urine:1200] Intake/Output this shift: Total I/O In: 360 [P.O.:360] Out: 450 [Urine:450]   Recent Labs  11/05/12 0839  HGB 9.1*    Recent Labs  11/05/12 0839  WBC 7.3  RBC 2.97*  HCT 26.8*  PLT 173    Recent Labs  11/06/12 0540  NA 140  K 3.7  CL 104  CO2 26  BUN 23  CREATININE 0.97  GLUCOSE 116*  CALCIUM 8.9   No results found for this basename: LABPT, INR,  in the last 72 hours  Phyical Exam  Gen:up with therapy, getting to chair Ext:  Left Lower extremity   Dec effusion   Motor and sensory functions intact   Ext warm   No acute changes   Good knee ROM      Fluid analysis  Results for ROCHELL, MABIE (MRN [id number]) as of 11/07/2012 11:21  Ref. Range 11/06/2012 12:11  Color, Synovial Latest Range: YELLOW  RED (A)  Appearance-Synovial Latest Range: CLEAR  CLOUDY (A)  Crystals, Fluid No range found INTRACELLULAR CALCIUM PYROPHOSPHATE CRYSTALS  WBC, Synovial Latest Range: 0-200 /cu mm 26529 (H)  Neutrophil, Synovial Latest Range: 0-25 % 93 (H)  Lymphocytes-Synovial Fld Latest Range: 0-20 % 1  Monocyte-Macrophage-Synovial Fluid Latest Range: 50-90 % 6 (L)   Fluid analysis consistent with inflammatory arthropathy- pseudogout   Gram stain: No growth to date   Assessment/Plan:  72 y/o AA male admitted for redo AV replacement, aortic root replacement and replacement of ascending throacic aortic  aneurysm with acute on chronic L knee pain   1. Acute on Chronic L knee pain, chronic malunion L tibial plateau and severe tricompartmental DJD, pseudogout            fluid analysis confirms inflammatory arthropathy  Calcium pyrophosphate crystals suggest psuedogout as the acute dx  pts are known to have flares during acute stress states such as surgery  Treatment will continue to be symptomatic- NSAIDs, pt is on ketorolac at this time and would continue for a total of 5 days of therapy, can transition to PO (10 mg q 6 hrs scheduled), Ice and elevate  Pt can follow up with [place] or ortho in [place] if he wishes. Would also recommend establishing care with a rheumatologist as well  WBAT L leg  ROM as tolerated   2. Cardiothoracic issues             Per primary team  3. Dispo  Per primary team   Please call with questions    Mearl Latin, PA-C [facility] [phone] (P) 11/07/2012, 11:19 AM

## 2012-11-08 LAB — CBC
MCH: 30.2 pg (ref 26.0–34.0)
MCHC: 34 g/dL (ref 30.0–36.0)
Platelets: 253 10*3/uL (ref 150–400)
RDW: 15 % (ref 11.5–15.5)

## 2012-11-08 LAB — BASIC METABOLIC PANEL
BUN: 27 mg/dL — ABNORMAL HIGH (ref 6–23)
Calcium: 8.9 mg/dL (ref 8.4–10.5)
GFR calc Af Amer: 79 mL/min — ABNORMAL LOW (ref >90)
GFR calc non Af Amer: 68 mL/min — ABNORMAL LOW (ref >90)
Glucose, Bld: 121 mg/dL — ABNORMAL HIGH (ref 70–99)
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
## 2012-11-08 NOTE — Progress Notes (Signed)
CARDIAC REHAB PHASE I   PRE:  Rate/Rhythm: 84 SR    BP: sitting 110/64    SaO2: 97 RA  MODE:  Ambulation: 250 ft   POST:  Rate/Rhythm: 106 ST    BP: sitting 136/66     SaO2: 98  Tolerated well. Limps but does well. Knee popping toward end. No c/o. [id number]   Elissa Lovett [place] CES, ACSM 11/08/2012 2:12 PM

## 2012-11-08 NOTE — Progress Notes (Addendum)
       [address]       [place] [address]             [phone]          9 Days Post-Op Procedure(s) (LRB): REDO STERNOTOMY (N/A) REDO AORTIC VALVE REPLACEMENT (AVR) (N/A) BENTALL PROCEDURE (N/A) REPLACEMENT ASCENDING AORTA  Subjective: Walked independently from room to hallway and back.  Left knee much less painful today.  No other complaints.   Objective: Vital signs in last 24 hours: Patient Vitals for the past 24 hrs:  BP Temp Temp src Pulse Resp SpO2 Weight  11/08/12 0410 128/76 mmHg 99.1 F (37.3 C) Oral 82 18 98 % 213 lb 10 oz (96.9 kg)  11/07/12 2004 124/74 mmHg 99.6 F (37.6 C) Oral 108 18 98 % -  11/07/12 1333 116/69 mmHg 98.1 F (36.7 C) Oral 82 18 100 % -  11/07/12 1019 124/74 mmHg - - 111 - - -   Current Weight  11/08/12 213 lb 10 oz (96.9 kg)  PRE OP WEIGHT: 95 kg    Intake/Output from previous day: 08/08 0701 - 08/09 0700 In: 600 [P.O.:600] Out: 800 [Urine:800]    PHYSICAL EXAM:  Heart: RRR Lungs: Clear Wound: Clean and dry Extremities: Mild LE edema    Lab Results: CBC: Recent Labs  11/05/12 0839 11/08/12 0550  WBC 7.3 6.7  HGB 9.1* 8.0*  HCT 26.8* 23.5*  PLT 173 253   BMET:  Recent Labs  11/06/12 0540 11/08/12 0550  NA 140 138  K 3.7 3.7  CL 104 103  CO2 26 26  GLUCOSE 116* 121*  BUN 23 27*  CREATININE 0.97 1.07  CALCIUM 8.9 8.9    PT/INR: No results found for this basename: LABPROT, INR,  in the last 72 hours    Assessment/Plan: S/P Procedure(s) (LRB): REDO STERNOTOMY (N/A) REDO AORTIC VALVE REPLACEMENT (AVR) (N/A) BENTALL PROCEDURE (N/A) REPLACEMENT ASCENDING AORTA CV- BPs, HR improved.  Continue beta blocker. Vol overload- Continue diuresis.  L knee effusion- s/p arthrocentesis, Cx and gram stain negative. Continue Toradol,  PT/ambulation.  Expected postop blood loss anemia- Henderson/Henderson down slightly. Watch. Disp- possibly home 1-2 days if his mobility continues to improve.    LOS: 9  days    Thomas Henderson 11/08/2012  Knee better Poss home in am I have seen and examined Thomas Henderson and agree with the above assessment  and plan.  Thomas Ovens MD Beeper [phone] Office [phone] 11/08/2012 12:10 PM

## 2012-11-08 NOTE — Progress Notes (Signed)
Pt ambulated 450 ft with walker. Limping on lt leg. Knee popping near end of walk. HR up in 130's while walking. Pt tolerated well. No complaints at this time. Will continue to monitor.

## 2012-11-09 LAB — URINALYSIS, ROUTINE W REFLEX MICROSCOPIC
Bilirubin Urine: NEGATIVE
Glucose, UA: NEGATIVE mg/dL
Hgb urine dipstick: NEGATIVE
Ketones, ur: NEGATIVE mg/dL
Leukocytes, UA: NEGATIVE
Nitrite: NEGATIVE
Protein, ur: NEGATIVE mg/dL
Specific Gravity, Urine: 1.015 (ref 1.005–1.030)
Urobilinogen, UA: 2 mg/dL — ABNORMAL HIGH (ref 0.0–1.0)
pH: 7.5 (ref 5.0–8.0)

## 2012-11-09 LAB — BODY FLUID CULTURE

## 2012-11-09 MED ORDER — FUROSEMIDE 40 MG PO TABS: 40.0000 mg | ORAL_TABLET | Freq: Every day | ORAL | Status: DC

## 2012-11-09 MED ORDER — OXYCODONE HCL 5 MG PO TABS: 5.0000 mg | ORAL_TABLET | ORAL | Status: DC | PRN

## 2012-11-09 MED ORDER — METOPROLOL TARTRATE 50 MG PO TABS: 75.0000 mg | ORAL_TABLET | Freq: Two times a day (BID) | ORAL | Status: DC

## 2012-11-09 MED ORDER — ASPIRIN 325 MG PO TBEC: 325.0000 mg | DELAYED_RELEASE_TABLET | Freq: Every day | ORAL | Status: DC

## 2012-11-09 MED ORDER — POTASSIUM CHLORIDE CRYS ER 20 MEQ PO TBCR: 20.0000 meq | EXTENDED_RELEASE_TABLET | Freq: Every day | ORAL | Status: DC

## 2012-11-09 NOTE — Care Management Note (Signed)
   CARE MANAGEMENT NOTE 11/09/2012  Patient:  Thomas Henderson, Thomas Henderson   Account Number:  [id number]  Date Initiated:  11/04/2012  Documentation initiated by:  AMERSON,JULIE  Subjective/Objective Assessment:   PT S/P BENTALL PROCEDURE ON 10/30/12.  PTA, PT INDEPENDENT, LIVES WITH SPOUSE.     Action/Plan:   WILL FOLLOW FOR HOME NEEDS AS PT PROGRESSES.   Anticipated DC Date:  11/06/2012   Anticipated DC Plan:  HOME W HOME HEALTH SERVICES      DC Planning Services  CM consult      [facility] Choice  HOME HEALTH   Choice offered to / List presented to:  C-1 Patient   DME arranged  3-N-1      DME agency  [facility]     HH arranged  HH-2 PT      [facility] agency  [facility]   Status of service:  In process, will continue to follow Medicare Important Message given?   (If response is "NO", the following Medicare IM given date fields will be blank) Date Medicare IM given:   Date Additional Medicare IM given:    Discharge Disposition:  HOME W HOME HEALTH SERVICES  Per UR Regulation:  Reviewed for med. necessity/level of care/duration of stay  If discussed at Long Length of Stay Meetings, dates discussed:   11/06/2012    Comments:  11/09/12 Gerhard Perches [phone] Spoke with patient regarding home health choice. He chose [facility] for PT services. 3 in 1 was ordered as well. Spoke with [facility] DME rep to make aware of referral. Also called [facility] to make aware of referral for PT services. Patient to discharge home with wife. Faxed orders and call made to [facility] to make aware of referral. Faxed confirmation received as well. [facility] representative states referral can be accepted. Of note patient reports he already has a rolling walker. [email or web address] [phone]   11/07/12 JULIE AMERSON,RN,BSN [phone] PT WITH SEVERE KNEE PAIN LIMITING MOBILITY; P.T. CONSULT THIS AM; RECOMMENDATION IS FOR HHPT AT DC.  WILL CONT TO FOLLOW/ARRANGE SERVICES AS ORDERED.

## 2012-11-09 NOTE — Progress Notes (Addendum)
       [address]       [place] [address]             [phone]          10 Days Post-Op Procedure(s) (LRB): REDO STERNOTOMY (N/A) REDO AORTIC VALVE REPLACEMENT (AVR) (N/A) BENTALL PROCEDURE (N/A) REPLACEMENT ASCENDING AORTA  Subjective: Feels well.  Concerned about low grade temp this am. Left knee doing much better and he is more ambulatory. Slight cough with clear sputum, denies CP, SOB or dysuria.   Objective: Vital signs in last 24 hours: Patient Vitals for the past 24 hrs:  BP Temp Temp src Pulse Resp SpO2 Weight  11/09/12 0633 115/65 mmHg 99.4 F (37.4 C) Oral 86 20 98 % 218 lb 9.6 oz (99.156 kg)  11/08/12 1955 123/60 mmHg 100.4 F (38 C) Oral 97 20 97 % -  11/08/12 1351 116/72 mmHg 99.6 F (37.6 C) Oral 86 18 100 % -   Current Weight  11/09/12 218 lb 9.6 oz (99.156 kg)  PRE OP WEIGHT: 95 kg    Intake/Output from previous day: 08/09 0701 - 08/10 0700 In: -  Out: 200 [Urine:200]    PHYSICAL EXAM:  Heart: RRR Lungs: Clear Wound: Clean and dry Extremities: Mild LE edema    Lab Results: CBC: Recent Labs  11/08/12 0550  WBC 6.7  HGB 8.0*  HCT 23.5*  PLT 253   BMET:  Recent Labs  11/08/12 0550  NA 138  K 3.7  CL 103  CO2 26  GLUCOSE 121*  BUN 27*  CREATININE 1.07  CALCIUM 8.9    PT/INR: No results found for this basename: LABPROT, INR,  in the last 72 hours    Assessment/Plan: S/P Procedure(s) (LRB): REDO STERNOTOMY (N/A) REDO AORTIC VALVE REPLACEMENT (AVR) (N/A) BENTALL PROCEDURE (N/A) REPLACEMENT ASCENDING AORTA CV- stable, continue current meds. Vol overload- Continue diuresis.  L knee effusion- s/p arthrocentesis, improving symptomatically. Continue Toradol, PT/ambulation.  LGF(Tm 100.4)- no signs of infection.  Likely atelectasis. WBC has been normal and knee fluid was negative.  Will watch this am. Disp-  if no more temps home in am.      LOS: 10 days    [name],[name] 11/09/2012  Knee  better Low grade fever, will check urine, wounds healed, may be related to left knee issues Home in am if no fever I have seen and examined Eliezer Mccoy and agree with the above assessment  and plan.  Delight Ovens MD Beeper [phone] Office [phone] 11/09/2012 11:27 AM

## 2012-11-10 ENCOUNTER — Inpatient Hospital Stay (HOSPITAL_COMMUNITY): Payer: Medicare Other

## 2012-11-10 LAB — CBC
HCT: 23.1 % — ABNORMAL LOW (ref 39.0–52.0)
Hemoglobin: 8 g/dL — ABNORMAL LOW (ref 13.0–17.0)
MCH: 30.7 pg (ref 26.0–34.0)
MCHC: 34.6 g/dL (ref 30.0–36.0)
MCV: 88.5 fL (ref 78.0–100.0)
Platelets: 420 10*3/uL — ABNORMAL HIGH (ref 150–400)
RBC: 2.61 MIL/uL — ABNORMAL LOW (ref 4.22–5.81)
RDW: 15.1 % (ref 11.5–15.5)
WBC: 5.3 10*3/uL (ref 4.0–10.5)

## 2012-11-10 LAB — BASIC METABOLIC PANEL
BUN: 24 mg/dL — ABNORMAL HIGH (ref 6–23)
CO2: 21 mEq/L (ref 19–32)
Calcium: 8.7 mg/dL (ref 8.4–10.5)
Chloride: 103 mEq/L (ref 96–112)
Creatinine, Ser: 1.04 mg/dL (ref 0.50–1.35)
GFR calc Af Amer: 82 mL/min — ABNORMAL LOW (ref >90)
GFR calc non Af Amer: 71 mL/min — ABNORMAL LOW (ref >90)
Glucose, Bld: 134 mg/dL — ABNORMAL HIGH (ref 70–99)
Potassium: 3.9 mEq/L (ref 3.5–5.1)
Sodium: 137 mEq/L (ref 135–145)

## 2012-11-10 MED ORDER — METOPROLOL SUCCINATE ER 50 MG PO TB24: 50.0000 mg | ORAL_TABLET | Freq: Every day | ORAL | Status: DC

## 2012-11-10 MED ORDER — HYDROCHLOROTHIAZIDE 25 MG PO TABS: 25.0000 mg | ORAL_TABLET | Freq: Every day | ORAL | Status: DC

## 2012-11-10 MED ORDER — ASPIRIN EC 81 MG PO TBEC
81.0000 mg | DELAYED_RELEASE_TABLET | Freq: Every day | ORAL | Status: DC
  Administered 2012-11-10: 81 mg via ORAL
  Filled 2012-11-10: qty 1

## 2012-11-10 MED ORDER — LISINOPRIL 40 MG PO TABS
40.0000 mg | ORAL_TABLET | Freq: Every day | ORAL | Status: DC
  Administered 2012-11-10: 40 mg via ORAL
  Filled 2012-11-10: qty 1

## 2012-11-10 MED ORDER — SIMVASTATIN 20 MG PO TABS
20.0000 mg | ORAL_TABLET | Freq: Every day | ORAL | Status: DC
  Filled 2012-11-10: qty 1

## 2012-11-10 MED ORDER — GABAPENTIN 300 MG PO CAPS
300.0000 mg | ORAL_CAPSULE | Freq: Every day | ORAL | Status: DC
  Administered 2012-11-10: 300 mg via ORAL
  Filled 2012-11-10: qty 1

## 2012-11-10 MED ORDER — METOPROLOL SUCCINATE ER 50 MG PO TB24
50.0000 mg | ORAL_TABLET | Freq: Every day | ORAL | Status: DC
  Administered 2012-11-10: 50 mg via ORAL
  Filled 2012-11-10: qty 1

## 2012-11-10 MED ORDER — HYDROCHLOROTHIAZIDE 25 MG PO TABS: 25.0000 mg | ORAL_TABLET | Freq: Every day | ORAL | Status: AC

## 2012-11-10 MED ORDER — LISINOPRIL 40 MG PO TABS: 40.0000 mg | ORAL_TABLET | Freq: Every day | ORAL | Status: DC

## 2012-11-10 MED ORDER — PANTOPRAZOLE SODIUM 40 MG PO TBEC
40.0000 mg | DELAYED_RELEASE_TABLET | Freq: Every day | ORAL | Status: DC
  Administered 2012-11-10: 40 mg via ORAL
  Filled 2012-11-10: qty 1

## 2012-11-10 MED ORDER — NIACIN 500 MG PO TABS
500.0000 mg | ORAL_TABLET | Freq: Every day | ORAL | Status: DC
  Filled 2012-11-10: qty 1

## 2012-11-10 MED ORDER — METOPROLOL SUCCINATE ER 25 MG PO TB24: 25.0000 mg | ORAL_TABLET | Freq: Every day | ORAL | Status: DC

## 2012-11-10 NOTE — Progress Notes (Signed)
CARDIAC REHAB PHASE I   PRE:  Rate/Rhythm: 90SR  BP:  Supine:   Sitting: 118/60  Standing:    SaO2: 99%RA  MODE:  Ambulation: 150 ft   POST:  Rate/Rhythm: 124ST  BP:  Supine:   Sitting: 139/67  Standing:    SaO2: 100%RA 1105-1125 Pt walked 150 ft with hand held asst. Did not want to use walker. Tired from previous walk and stairs. Tolerated well.   Luetta Nutting, RN BSN  11/10/2012 11:23 AM

## 2012-11-10 NOTE — Progress Notes (Addendum)
11 Days Post-Op  Procedure(s) (LRB): REDO STERNOTOMY (N/A) REDO AORTIC VALVE REPLACEMENT (AVR) (N/A) BENTALL PROCEDURE (N/A) REPLACEMENT ASCENDING AORTA Subjective: He feels pretty well, some mild knee discomfort, ambulating well, mild non-prod cough  Objective  Telemetry sinus rhythm  Temp:  [99.5 F (37.5 C)-99.9 F (37.7 C)] 99.6 F (37.6 C) (08/11 0518) Pulse Rate:  [84-85] 84 (08/11 0518) Resp:  [18-20] 18 (08/11 0518) BP: (128-133)/(70-78) 128/70 mmHg (08/11 0518) SpO2:  [98 %] 98 % (08/11 0518) Weight:  [216 lb 1.6 oz (98.022 kg)] 216 lb 1.6 oz (98.022 kg) (08/11 0518)   Intake/Output Summary (Last 24 hours) at 11/10/12 0730 Last data filed at 11/10/12 0533  Gross per 24 hour  Intake    120 ml  Output      0 ml  Net    120 ml       General appearance: alert, cooperative and no distress Heart: regular rate and rhythm Lungs: dim in left base Abdomen: benign Extremities: mild edema Wound: incisions healing well  Lab Results:  Recent Labs  11/08/12 0550  NA 138  K 3.7  CL 103  CO2 26  GLUCOSE 121*  BUN 27*  CREATININE 1.07  CALCIUM 8.9   No results found for this basename: AST, ALT, ALKPHOS, BILITOT, PROT, ALBUMIN,  in the last 72 hours No results found for this basename: LIPASE, AMYLASE,  in the last 72 hours  Recent Labs  11/08/12 0550  WBC 6.7  HGB 8.0*  HCT 23.5*  MCV 88.7  PLT 253   No results found for this basename: CKTOTAL, CKMB, TROPONINI,  in the last 72 hours No components found with this basename: POCBNP,  No results found for this basename: DDIMER,  in the last 72 hours No results found for this basename: HGBA1C,  in the last 72 hours No results found for this basename: CHOL, HDL, LDLCALC, TRIG, CHOLHDL,  in the last 72 hours No results found for this basename: TSH, T4TOTAL, FREET3, T3FREE, THYROIDAB,  in the last 72 hours No results found for this basename: VITAMINB12, FOLATE, FERRITIN, TIBC, IRON, RETICCTPCT,  in the last 72  hours  Medications: Scheduled . aspirin EC  325 mg Oral Daily  . docusate sodium  200 mg Oral Daily  . famotidine  20 mg Oral BID  . furosemide  40 mg Oral Daily  . ketorolac  10 mg Oral Q6H  . methylPREDNISolone acetate  40 mg Intra-articular Once  . metoprolol tartrate  75 mg Oral BID  . potassium chloride  20 mEq Oral Daily  . sodium chloride  3 mL Intravenous Q12H     Radiology/Studies:  No results found.  INR: Will add last result for INR, ABG once components are confirmed Will add last 4 CBG results once components are confirmed  Assessment/Plan: S/P Procedure(s) (LRB): REDO STERNOTOMY (N/A) REDO AORTIC VALVE REPLACEMENT (AVR) (N/A) BENTALL PROCEDURE (N/A) REPLACEMENT ASCENDING AORTA  1 tm 99.9- urine unremarkable(UA), asymptomatic 2 will check CXR as may have mod left effusion. If unremarkable, should be able to discharge  LOS: 11 days    GOLD,WAYNE E 8/11/20147:30 AM  I have seen and examined the patient and agree with the assessment and plan as outlined.  CXR looks good with small bilateral effusions, L>R.  D/C home today.  Instructions given.  Resume pre-op meds for hypertension including Toprol, Lisinopril, and HCTZ - EPIC will not allow home meds to be resumed - discharge medication list may be confusing but should simply instruct  the patient to resume all pre-admission medications.   OWEN,CLARENCE H 11/10/2012 8:57 AM
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
## 2012-11-10 NOTE — Progress Notes (Signed)
DISCHARGE INSTRUCTIONS REVIEWED WITH PT. PRESCRIPTIONS GIVEN. 3N1 GIVEN TO PT. REVIEWED INCISIONAL CARE, PAIN MANG. SAFETY, AND MED MANG. PT AND WIFE VU. PT LEFT UNIT VIA WC NO S/S OF DISTRESS NO VOICED COMPLAINTS.

## 2012-11-11 ENCOUNTER — Other Ambulatory Visit: Payer: Self-pay | Admitting: *Deleted

## 2012-11-11 DIAGNOSIS — R609 Edema, unspecified: Secondary | ICD-10-CM

## 2012-11-11 DIAGNOSIS — N390 Urinary tract infection, site not specified: Secondary | ICD-10-CM

## 2012-11-11 LAB — URINE CULTURE: Colony Count: >=100000

## 2012-11-11 MED ORDER — CIPROFLOXACIN HCL 500 MG PO TABS: 500.0000 mg | ORAL_TABLET | Freq: Two times a day (BID) | ORAL | Status: DC

## 2012-11-11 MED ORDER — FUROSEMIDE 40 MG PO TABS: 40.0000 mg | ORAL_TABLET | Freq: Every day | ORAL | Status: DC

## 2012-11-11 MED ORDER — POTASSIUM CHLORIDE CRYS ER 20 MEQ PO TBCR: 20.0000 meq | EXTENDED_RELEASE_TABLET | Freq: Every day | ORAL | Status: DC

## 2012-11-20 NOTE — Consult Note (Signed)
I have seen and examined the patient and discussed presentation in detail with Mr. Renae Fickle. I agree with the findings above and the plan.  We will follow up with tap results.  Budd Palmer, MD

## 2012-11-24 ENCOUNTER — Ambulatory Visit
Admission: RE | Admit: 2012-11-24 | Discharge: 2012-11-24 | Disposition: A | Discharge status: Home / Self Care | Payer: Medicare Other | Source: Ambulatory Visit | Attending: Thoracic Surgery (Cardiothoracic Vascular Surgery) | Admitting: Thoracic Surgery (Cardiothoracic Vascular Surgery)

## 2012-11-24 ENCOUNTER — Encounter: Payer: Self-pay | Admitting: Thoracic Surgery (Cardiothoracic Vascular Surgery)

## 2012-11-24 ENCOUNTER — Ambulatory Visit (INDEPENDENT_AMBULATORY_CARE_PROVIDER_SITE_OTHER): Payer: Self-pay | Admitting: Thoracic Surgery (Cardiothoracic Vascular Surgery)

## 2012-11-24 VITALS — BP 103/75 | HR 94 | Temp 97.8°F | Resp 20 | Ht 66.0 in | Wt 195.0 lb

## 2012-11-24 DIAGNOSIS — Z953 Presence of xenogenic heart valve: Secondary | ICD-10-CM

## 2012-11-24 DIAGNOSIS — Z9889 Other specified postprocedural states: Secondary | ICD-10-CM

## 2012-11-24 DIAGNOSIS — Z8679 Personal history of other diseases of the circulatory system: Secondary | ICD-10-CM

## 2012-11-24 DIAGNOSIS — I059 Rheumatic mitral valve disease, unspecified: Secondary | ICD-10-CM

## 2012-11-24 DIAGNOSIS — I712 Thoracic aortic aneurysm, without rupture: Secondary | ICD-10-CM

## 2012-11-24 DIAGNOSIS — I359 Nonrheumatic aortic valve disorder, unspecified: Secondary | ICD-10-CM

## 2012-11-24 DIAGNOSIS — I350 Nonrheumatic aortic (valve) stenosis: Secondary | ICD-10-CM

## 2012-11-24 DIAGNOSIS — Z952 Presence of prosthetic heart valve: Secondary | ICD-10-CM

## 2012-11-24 DIAGNOSIS — Z09 Encounter for follow-up examination after completed treatment for conditions other than malignant neoplasm: Secondary | ICD-10-CM

## 2012-11-24 NOTE — Patient Instructions (Signed)
The patient is encouraged to enroll and participate in the outpatient cardiac rehab program beginning as soon as practical.  The patient may return to driving an automobile as long as they are no longer requiring oral narcotic pain relievers during the daytime.  It would be wise to start driving only short distances during the daylight and gradually increase from there as they feel comfortable.  The patient should continue to avoid any heavy lifting or strenuous use of arms or shoulders for at least a total of three months from the time of surgery.

## 2012-11-24 NOTE — Progress Notes (Signed)
[address]       [name] [address]             [phone]     CARDIOTHORACIC SURGERY OFFICE NOTE  Referring Provider is Tonny Bollman, MD PCP is Irena Reichmann, DO   HPI:  Patient returns for routine followup status post redo median sternotomy for repair of the ascending thoracic aortic aneurysm and aneurysm of the aortic root using biological Bentall aortic root replacement with redo aortic valve replacement on 10/30/2012. His postoperative recovery has been remarkably uncomplicated. Prior to hospital discharge he did require aspiration of his left knee because of acute knee pain with effusion related to the development of pseudogout in the setting of long-standing degenerative arthritis of the left knee. Otherwise she has done exceptionally well. Since hospital discharge the patient has continued to do very well. He has had remarkably little pain in his chest and he is no longer taking any sort of pain relievers. He has no shortness of breath. His appetite is good. He is ambulating reasonably well despite the chronic pain in his left knee, although he states that this has not gotten worse than it was at the time of hospital discharge. Overall he is quite pleased with his progress.   Current Outpatient Prescriptions  Medication Sig Dispense Refill  . aspirin EC 325 MG EC tablet Take 1 tablet (325 mg total) by mouth daily.  30 tablet  0  . hydrochlorothiazide (HYDRODIURIL) 25 MG tablet Take 1 tablet (25 mg total) by mouth daily.  30 tablet  1  . lisinopril (PRINIVIL,ZESTRIL) 40 MG tablet Take 1 tablet (40 mg total) by mouth daily.  30 tablet  1  . metoprolol succinate (TOPROL-XL) 50 MG 24 hr tablet Take 1 tablet (50 mg total) by mouth daily. Take with or immediately following a meal.  30 tablet  1  . niacin 500 MG tablet Take 500 mg by mouth daily with breakfast.      . simvastatin (ZOCOR) 20 MG tablet Take 20 mg by mouth at bedtime.         No current  facility-administered medications for this visit.      Physical Exam:   BP 103/75  Pulse 94  Temp(Src) 97.8 F (36.6 C) (Oral)  Resp 20  Ht 5\' 6"  (1.676 m)  Wt 195 lb (88.451 kg)  BMI 31.49 kg/m2  SpO2 96%  General:  Well-appearing  Chest:   Clear to auscultation with symmetrical breath sounds  CV:   Regular rate and rhythm without murmur  Incisions:  Clean and dry and healing nicely, sternum is stable  Abdomen:  Soft and nontender  Extremities:  Warm and well-perfused with no lower extremity edema  Diagnostic Tests:  CLINICAL DATA: Postop from repair of ascending thoracic aortic  aneurysm.  EXAM:  CHEST 2 VIEW  COMPARISON: 11/10/2012  FINDINGS:  Pleural effusions and basilar atelectasis have resolved since  previous study. Both lungs are clear. No evidence of pneumothorax.  Heart size is within normal limits. Prior aortic and mitral valve  replacements again noted.  IMPRESSION:  No active cardiopulmonary disease.  Electronically Signed  By: Myles Rosenthal  On: 11/24/2012 15:14    Impression:  Patient is doing exceptionally well 4 weeks status post redo sternotomy for biological Bentall aortic root replacement with redo aortic valve replacement.  Plan:  I've encouraged patient to continue to gradually increase his physical activity as tolerated with his limitations remaining that he refrain  from heavy lifting or strenuous use of his arms or shoulders for least another 2 months. I've encouraged him to get started the cardiac rehabilitation program. I think it is reasonable for him to resume driving an automobile. We have not made any changes in his current medications. All of his questions been addressed. The patient will return in 3 months for routine followup.   Salvatore Decent. Cornelius Moras, MD 11/24/2012 4:33 PM

## 2012-12-09 ENCOUNTER — Ambulatory Visit (INDEPENDENT_AMBULATORY_CARE_PROVIDER_SITE_OTHER): Payer: Medicare Other | Admitting: Nurse Practitioner

## 2012-12-09 ENCOUNTER — Encounter: Payer: Self-pay | Admitting: Nurse Practitioner

## 2012-12-09 VITALS — BP 120/70 | HR 92 | Ht 66.0 in | Wt 195.1 lb

## 2012-12-09 DIAGNOSIS — I359 Nonrheumatic aortic valve disorder, unspecified: Secondary | ICD-10-CM

## 2012-12-09 DIAGNOSIS — Z9889 Other specified postprocedural states: Secondary | ICD-10-CM

## 2012-12-09 DIAGNOSIS — Z8679 Personal history of other diseases of the circulatory system: Secondary | ICD-10-CM

## 2012-12-09 LAB — CBC WITH DIFFERENTIAL/PLATELET
Basophils Absolute: 0 10*3/uL (ref 0.0–0.1)
Basophils Relative: 0.7 % (ref 0.0–3.0)
Eosinophils Absolute: 0.1 10*3/uL (ref 0.0–0.7)
Eosinophils Relative: 2.4 % (ref 0.0–5.0)
HCT: 30.6 % — ABNORMAL LOW (ref 39.0–52.0)
Hemoglobin: 10.3 g/dL — ABNORMAL LOW (ref 13.0–17.0)
Lymphocytes Relative: 26.1 % (ref 12.0–46.0)
Lymphs Abs: 1.4 10*3/uL (ref 0.7–4.0)
MCHC: 33.7 g/dL (ref 30.0–36.0)
MCV: 89.7 fl (ref 78.0–100.0)
Monocytes Absolute: 0.5 10*3/uL (ref 0.1–1.0)
Monocytes Relative: 10.5 % (ref 3.0–12.0)
Neutro Abs: 3.2 10*3/uL (ref 1.4–7.7)
Neutrophils Relative %: 60.3 % (ref 43.0–77.0)
Platelets: 316 10*3/uL (ref 150.0–400.0)
RBC: 3.42 Mil/uL — ABNORMAL LOW (ref 4.22–5.81)
RDW: 17.6 % — ABNORMAL HIGH (ref 11.5–14.6)
WBC: 5.2 10*3/uL (ref 4.5–10.5)

## 2012-12-09 LAB — BASIC METABOLIC PANEL
BUN: 17 mg/dL (ref 6–23)
CO2: 27 mEq/L (ref 19–32)
Calcium: 9.3 mg/dL (ref 8.4–10.5)
Chloride: 107 mEq/L (ref 96–112)
Creatinine, Ser: 1 mg/dL (ref 0.4–1.5)
GFR: 98.21 mL/min (ref >60.00)
Glucose, Bld: 100 mg/dL — ABNORMAL HIGH (ref 70–99)
Potassium: 4.1 mEq/L (ref 3.5–5.1)
Sodium: 140 mEq/L (ref 135–145)

## 2012-12-09 NOTE — Patient Instructions (Addendum)
We need to check labs today  Stop your Niacin  See Dr. [name] with an echo in 2 months  Stay active  Call the [facility] office at [phone] if you have any questions, problems or concerns.

## 2012-12-09 NOTE — Progress Notes (Signed)
Thomas Henderson Date of Birth: [birth year] Medical Record #[id number]  History of Present Illness: Mr. Ager is seen back today for a post hospital visit. Seen for Dr. [name]. He has had recent redo median sternotomy for repair of an ascending thoracic aortic aneurysm and aneurysm of the aortic root using a biological Bentall aortic root replacement with redo AVR on October 30, 2012 per Dr. Cornelius Moras. He had a basically uncomplicated post op course. Prior to that he had to have his knee aspirated due to effusion and pseudogout. His other issues include HTN, HLD, and originally had AVR and prior MV repair in 2008.  He has had chronic DOE and fatigue.   Comes in today. Here with family. Has been home over a month. More limited by his knee issues. He has been doing very well. No chest pain. Not short of breath. Little dizziness - but if he gets up too quickly. No syncope. BP is good at home and at rehab. Wants to ride his riding [employer], [employer]. Wanting a handicap sticker and seeing ortho later this month.   Current Outpatient Prescriptions  Medication Sig Dispense Refill  . aspirin 81 MG tablet Take 81 mg by mouth daily.      . hydrochlorothiazide (HYDRODIURIL) 25 MG tablet Take 1 tablet (25 mg total) by mouth daily.  30 tablet  1  . lisinopril (PRINIVIL,ZESTRIL) 40 MG tablet Take 1 tablet (40 mg total) by mouth daily.  30 tablet  1  . metoprolol succinate (TOPROL-XL) 50 MG 24 hr tablet Take 1 tablet (50 mg total) by mouth daily. Take with or immediately following a meal.  30 tablet  1  . niacin 500 MG tablet Take 500 mg by mouth daily with breakfast.      . simvastatin (ZOCOR) 20 MG tablet Take 20 mg by mouth at bedtime.         No current facility-administered medications for this visit.    No Known Allergies  Past Medical History  Diagnosis Date  . Mitral regurgitation     a. 2008 - MVR w/ 26mm annuloplasty ring;  b. 12/2007 Echo Triv MR  . Aortic insufficiency     a. 2008 - AVR w/ 25mm pericardial  tissue valve;  a. 12/2007 Echo No AI.  Marland Kitchen Dyslipidemia     takes Simvastatin daily  . ED (erectile dysfunction)   . Aortic root dilatation     a. 12/2007 Echo: Ao Root 48mm  . S/P aortic valve replacement with bioprosthetic valve 08/13/2006    #50mm Edwards Magna pericardial tissue valve  . S/P mitral valve repair 08/13/2006    #10mm Edwards Physio ring annuloplasty  . Thoracic ascending aortic aneurysm 09/07/2011  . HTN (hypertension)     takes Lisinopril and Metoprolol daily  . Intermittent lightheadedness   . Arthritis     legs  . Back pain     several back surgeries  . GERD (gastroesophageal reflux disease)     takes Protonix daily  . History of colon polyps   . CHF (congestive heart failure)     a. h/o severe LV dysfxn in setting of valvular dzs in 2008;  b. 12/2007 Echo: EF 55-60%, No RWMA, No AI, Mod Ao Root Dil, Triv MR/PR/TR, NL RV.  . S/P ascending aortic aneurysm repair 10/30/2012    Redo sternotomy for repair of ascending thoracic aortic aneurysm using Bentall aortic root replacement with [facility] Ease pericardial tissue valve-conduit (size 25mm valve, size 28mm graft conduit) with  reimplantation of Left Main and Right Coronary Artery   . Left knee DJD 11/07/2012  . Type I or II open fracture of left tibial plateau with malunion 11/07/2012  . Pseudogout of left knee 11/07/2012    Past Surgical History  Procedure Laterality Date  . Leg surgery      left  . Eye surgery      left  . Tonsillectomy    . Lumbar laminectomy    . Lumbar disc surgery    . Aortic valve replacement  08/13/2006    #16mm [facility] pericardial tissue valve  . Mitral valve repair  08/13/2006    #38mm Edwards Physio ring annuloplasty  . Cardiac catheterization  07/31/06    10/13/12  . Colonoscopy    . Aortic valve replacement N/A 10/30/2012    Procedure: REDO AORTIC VALVE REPLACEMENT (AVR);  Surgeon: Purcell Nails, MD;  Location: [facility] OR;  Service: Open Heart Surgery;  Laterality: N/A;  Zachary George  procedure N/A 10/30/2012    Procedure: BENTALL PROCEDURE;  Surgeon: Purcell Nails, MD;  Location: [facility] OR;  Service: Open Heart Surgery;  Laterality: N/A;  . Replacement ascending aorta  10/30/2012    Procedure: REPLACEMENT ASCENDING AORTA;  Surgeon: Purcell Nails, MD;  Location: [facility] OR;  Service: Open Heart Surgery;;    History  Smoking status  . Never Smoker   Smokeless tobacco  . Not on file    History  Alcohol Use No    Family History  Problem Relation Age of Onset  . Cardiomyopathy    . Heart attack Brother 65  . Other Mother 11    Enlarged heart    Review of Systems: The review of systems is per the HPI.  All other systems were reviewed and are negative.  Physical Exam: BP 120/70  Pulse 92  Ht 5\' 6"  (1.676 m)  Wt 195 lb 1.9 oz (88.506 kg)  BMI 31.51 kg/m2 Patient is very pleasant and in no acute distress. Skin is warm and dry. Color is normal.  HEENT is unremarkable. Normocephalic/atraumatic. PERRL. Sclera are nonicteric. Neck is supple. No masses. No JVD. Lungs are clear. Cardiac exam shows a regular rate and rhythm. Sternum looks good. Valve is crisp. Abdomen is soft. Extremities are without edema. Gait and ROM are intact. No gross neurologic deficits noted.  LABORATORY DATA:  EKG today shows sinus rhythm.  Lab Results  Component Value Date   WBC 5.3 11/10/2012   HGB 8.0* 11/10/2012   HCT 23.1* 11/10/2012   PLT 420* 11/10/2012   GLUCOSE 134* 11/10/2012   ALT 18 10/28/2012   AST 23 10/28/2012   NA 137 11/10/2012   K 3.9 11/10/2012   CL 103 11/10/2012   CREATININE 1.04 11/10/2012   BUN 24* 11/10/2012   CO2 21 11/10/2012   INR 0.82 10/30/2012   HGBA1C 5.6 10/28/2012     Assessment / Plan: 1. S/P redo sternotomy for repair of ascending thoracic aneurysm with Bentall/AVR - doing well. Needs follow up labs today. See him back in 2 months with an echo.   2. Knee pain - seems to be his most limiting factor - seeing ortho later this month.   3. HLD - I have stopped his  Niacin. He does not have CAD and with recent studies - it is not beneficial.   Need to check labs today. Plan for echo in 2 to 3 months. Follow up with Dr. [name].   Patient is agreeable to this plan  and will call if any problems develop in the interim.   Rosalio Macadamia, RN, ANP-C [facility] [address] [place], [place]  [address]

## 2013-02-03 ENCOUNTER — Other Ambulatory Visit: Payer: Self-pay

## 2013-02-03 MED ORDER — METOPROLOL SUCCINATE ER 50 MG PO TB24: 50.0000 mg | ORAL_TABLET | Freq: Every day | ORAL | Status: DC

## 2013-02-10 ENCOUNTER — Other Ambulatory Visit (HOSPITAL_COMMUNITY): Payer: Self-pay | Admitting: Cardiovascular Disease

## 2013-02-10 ENCOUNTER — Ambulatory Visit (HOSPITAL_COMMUNITY): Payer: Medicare Other | Attending: Nurse Practitioner | Admitting: Radiology

## 2013-02-10 ENCOUNTER — Encounter: Payer: Self-pay | Admitting: Internal Medicine

## 2013-02-10 ENCOUNTER — Encounter: Payer: Self-pay | Admitting: Cardiovascular Disease

## 2013-02-10 ENCOUNTER — Ambulatory Visit (INDEPENDENT_AMBULATORY_CARE_PROVIDER_SITE_OTHER): Payer: Medicare Other | Admitting: Cardiovascular Disease

## 2013-02-10 VITALS — BP 120/80 | HR 75 | Ht 66.0 in | Wt 204.0 lb

## 2013-02-10 DIAGNOSIS — R0989 Other specified symptoms and signs involving the circulatory and respiratory systems: Secondary | ICD-10-CM | POA: Insufficient documentation

## 2013-02-10 DIAGNOSIS — R5381 Other malaise: Secondary | ICD-10-CM | POA: Insufficient documentation

## 2013-02-10 DIAGNOSIS — Z8679 Personal history of other diseases of the circulatory system: Secondary | ICD-10-CM

## 2013-02-10 DIAGNOSIS — I059 Rheumatic mitral valve disease, unspecified: Secondary | ICD-10-CM | POA: Insufficient documentation

## 2013-02-10 DIAGNOSIS — R0609 Other forms of dyspnea: Secondary | ICD-10-CM | POA: Insufficient documentation

## 2013-02-10 DIAGNOSIS — E785 Hyperlipidemia, unspecified: Secondary | ICD-10-CM | POA: Insufficient documentation

## 2013-02-10 DIAGNOSIS — I10 Essential (primary) hypertension: Secondary | ICD-10-CM | POA: Insufficient documentation

## 2013-02-10 DIAGNOSIS — Z9889 Other specified postprocedural states: Secondary | ICD-10-CM

## 2013-02-10 DIAGNOSIS — I359 Nonrheumatic aortic valve disorder, unspecified: Secondary | ICD-10-CM

## 2013-02-10 DIAGNOSIS — Z952 Presence of prosthetic heart valve: Secondary | ICD-10-CM | POA: Insufficient documentation

## 2013-02-10 LAB — CBC WITH DIFFERENTIAL/PLATELET
Basophils Absolute: 0 10*3/uL (ref 0.0–0.1)
Eosinophils Absolute: 0.1 10*3/uL (ref 0.0–0.7)
Lymphocytes Relative: 24.5 % (ref 12.0–46.0)
MCHC: 32.7 g/dL (ref 30.0–36.0)
MCV: 92.1 fl (ref 78.0–100.0)
Monocytes Absolute: 0.5 10*3/uL (ref 0.1–1.0)
Neutrophils Relative %: 63.6 % (ref 43.0–77.0)
Platelets: 289 10*3/uL (ref 150.0–400.0)

## 2013-02-10 LAB — BASIC METABOLIC PANEL
BUN: 21 mg/dL (ref 6–23)
CO2: 27 mEq/L (ref 19–32)
Calcium: 9.6 mg/dL (ref 8.4–10.5)
Chloride: 104 mEq/L (ref 96–112)
Creatinine, Ser: 1 mg/dL (ref 0.4–1.5)

## 2013-02-10 MED ORDER — LOSARTAN POTASSIUM 50 MG PO TABS: 50.0000 mg | ORAL_TABLET | Freq: Every day | ORAL | Status: DC

## 2013-02-10 NOTE — Progress Notes (Signed)
Echocardiogram performed.  

## 2013-02-10 NOTE — Patient Instructions (Signed)
Your physician has recommended you make the following change in your medication: STOP Lisinopril, START Losartan 50mg  take one by mouth daily  Your physician recommends that you have lab work today: BMP and CBC  Your physician recommends that you schedule a follow-up appointment in: 3 MONTHS with Dr [name]

## 2013-02-10 NOTE — Progress Notes (Signed)
HPI:  Thomas Henderson returns for followup evaluation. He has undergone previous aortic valve replacement and mitral valve repair. He underwent redo sternotomy for repair of an ascending thoracic aortic aneurysm with redo AVR 10/30/2012. A bioprosthesis was utilized.  The patient complains of episodic lightheadedness and weakness. He thinks his blood pressure is running too low. At cardiac rehabilitation he has systolic readings around 100 mmHg frequently. He has not had syncope. He denies chest pain or shortness of breath. He continues to have some problems with his knees. He also complains of a nocturnal cough without sputum production.  Outpatient Encounter Prescriptions as of 02/10/2013  Medication Sig  . aspirin 81 MG tablet Take 81 mg by mouth daily.  Marland Kitchen FLUVIRIN PRESERVATIVE FREE SUSP injection   . hydrochlorothiazide (HYDRODIURIL) 25 MG tablet Take 1 tablet (25 mg total) by mouth daily.  Marland Kitchen lisinopril (PRINIVIL,ZESTRIL) 40 MG tablet Take 1 tablet (40 mg total) by mouth daily.  . metoprolol succinate (TOPROL-XL) 50 MG 24 hr tablet Take 1 tablet (50 mg total) by mouth daily. Take with or immediately following a meal.  . simvastatin (ZOCOR) 20 MG tablet Take 20 mg by mouth at bedtime.      No Known Allergies  Past Medical History  Diagnosis Date  . Mitral regurgitation     a. 2008 - MVR w/ 26mm annuloplasty ring;  b. 12/2007 Echo Triv MR  . Aortic insufficiency     a. 2008 - AVR w/ 25mm pericardial tissue valve;  a. 12/2007 Echo No AI.  Marland Kitchen Dyslipidemia     takes Simvastatin daily  . ED (erectile dysfunction)   . Aortic root dilatation     a. 12/2007 Echo: Ao Root 48mm  . S/P aortic valve replacement with bioprosthetic valve 08/13/2006    #63mm Edwards Magna pericardial tissue valve  . S/P mitral valve repair 08/13/2006    #13mm Edwards Physio ring annuloplasty  . Thoracic ascending aortic aneurysm 09/07/2011  . HTN (hypertension)     takes Lisinopril and Metoprolol daily  . Intermittent  lightheadedness   . Arthritis     legs  . Back pain     several back surgeries  . GERD (gastroesophageal reflux disease)     takes Protonix daily  . History of colon polyps   . CHF (congestive heart failure)     a. h/o severe LV dysfxn in setting of valvular dzs in 2008;  b. 12/2007 Echo: EF 55-60%, No RWMA, No AI, Mod Ao Root Dil, Triv MR/PR/TR, NL RV.  . S/P ascending aortic aneurysm repair 10/30/2012    Redo sternotomy for repair of ascending thoracic aortic aneurysm using Bentall aortic root replacement with [facility] Ease pericardial tissue valve-conduit (size 25mm valve, size 28mm graft conduit) with reimplantation of Left Main and Right Coronary Artery   . Left knee DJD 11/07/2012  . Type I or II open fracture of left tibial plateau with malunion 11/07/2012  . Pseudogout of left knee 11/07/2012   ROS: Negative except as per HPI  BP 120/80  Pulse 75  Ht 5\' 6"  (1.676 m)  Wt 204 lb (92.534 kg)  BMI 32.94 kg/m2  SpO2 99%  PHYSICAL EXAM: Pt is alert and oriented, NAD HEENT: normal Neck: JVP - normal, carotids 2+= without bruits Lungs: CTA bilaterally CV: RRR without murmur or gallop Abd: soft, NT, Positive BS, no hepatomegaly Ext: no C/C/E, distal pulses intact and equal Skin: warm/dry no rash  ASSESSMENT AND PLAN: 1. Valvular heart disease with  history of mitral valve repair and recent redo aortic valve replacement using a bioprosthesis. Normal heart sounds on exam without murmur. Repeat echocardiogram this morning is pending. Overall he appears stable and I will see him back in 3 months.  2. Essential hypertension. Blood pressure may be overtreated. He likely has a dry cough related to lisinopril. I'm going to switch him to losartan 50 mg daily for a lower equivalent dose of an ARB. He will continue on hydrochlorothiazide and Toprol succinate. I will see him back for further evaluation in 3 months.  3. Thoracic aortic aneurysm, now status post repair. Patient for followup  echocardiogram today.  Overall I think he is making good progress. His cardiac exam is essentially normal and I will review his echo done this morning. Medication changes as outlined above.   Tonny Bollman 02/10/2013 10:42 AM

## 2013-02-11 ENCOUNTER — Other Ambulatory Visit: Payer: Self-pay | Admitting: *Deleted

## 2013-02-16 ENCOUNTER — Ambulatory Visit: Payer: Medicare Other | Admitting: Thoracic Surgery (Cardiothoracic Vascular Surgery)

## 2013-02-23 ENCOUNTER — Ambulatory Visit: Payer: Medicare Other | Admitting: Thoracic Surgery (Cardiothoracic Vascular Surgery)

## 2013-05-12 ENCOUNTER — Ambulatory Visit: Payer: Medicare Other | Admitting: Cardiovascular Disease

## 2013-05-22 ENCOUNTER — Ambulatory Visit (INDEPENDENT_AMBULATORY_CARE_PROVIDER_SITE_OTHER): Payer: Medicare HMO | Admitting: Cardiovascular Disease

## 2013-05-22 ENCOUNTER — Encounter: Payer: Self-pay | Admitting: Cardiovascular Disease

## 2013-05-22 ENCOUNTER — Encounter (INDEPENDENT_AMBULATORY_CARE_PROVIDER_SITE_OTHER): Payer: Self-pay

## 2013-05-22 VITALS — BP 142/90 | HR 70 | Ht 66.0 in | Wt 206.4 lb

## 2013-05-22 DIAGNOSIS — I509 Heart failure, unspecified: Secondary | ICD-10-CM

## 2013-05-22 DIAGNOSIS — E785 Hyperlipidemia, unspecified: Secondary | ICD-10-CM

## 2013-05-22 DIAGNOSIS — I10 Essential (primary) hypertension: Secondary | ICD-10-CM

## 2013-05-22 NOTE — Patient Instructions (Signed)
Your physician wants you to follow-up in: 6 months with Dr. Cooper.  You will receive a reminder letter in the mail two months in advance. If you don't receive a letter, please call our office to schedule the follow-up appointment.  Your physician recommends that you continue on your current medications as directed. Please refer to the Current Medication list given to you today.  

## 2013-05-22 NOTE — Progress Notes (Signed)
HPI:  [name] returns for followup evaluation. He has undergone previous aortic valve replacement and mitral valve repair. He underwent redo sternotomy for repair of an ascending thoracic aortic aneurysm with redo bioprosthetic AVR 10/30/2012.  The patient is doing well. He has no chest pain, dyspnea, lightheadedness, or syncope. Denies edema, orthopnea, or PND. Main issue is left knee pain. He's having major problems with pain and limited mobility and needs to go for knee replacement. Here today for cardiac clearance.   Outpatient Encounter Prescriptions as of 05/22/2013  Medication Sig  . aspirin 81 MG tablet Take 81 mg by mouth daily.  Marland Kitchen. FLUVIRIN PRESERVATIVE FREE SUSP injection   . hydrochlorothiazide (HYDRODIURIL) 25 MG tablet Take 1 tablet (25 mg total) by mouth daily.  Marland Kitchen. losartan (COZAAR) 50 MG tablet Take 1 tablet (50 mg total) by mouth daily.  . metoprolol succinate (TOPROL-XL) 50 MG 24 hr tablet Take 1 tablet (50 mg total) by mouth daily. Take with or immediately following a meal.  . simvastatin (ZOCOR) 20 MG tablet Take 20 mg by mouth at bedtime.      No Known Allergies  Past Medical History  Diagnosis Date  . Mitral regurgitation     a. 2008 - MVR w/ 26mm annuloplasty ring;  b. 12/2007 Echo Triv MR  . Aortic insufficiency     a. 2008 - AVR w/ 25mm pericardial tissue valve;  a. 12/2007 Echo No AI.  Marland Kitchen. Dyslipidemia     takes Simvastatin daily  . ED (erectile dysfunction)   . Aortic root dilatation     a. 12/2007 Echo: Ao Root 48mm  . S/P aortic valve replacement with bioprosthetic valve 08/13/2006    #2825mm Edwards Magna pericardial tissue valve  . S/P mitral valve repair 08/13/2006    #5626mm Edwards Physio ring annuloplasty  . Thoracic ascending aortic aneurysm 09/07/2011  . HTN (hypertension)     takes Lisinopril and Metoprolol daily  . Intermittent lightheadedness   . Arthritis     legs  . Back pain     several back surgeries  . GERD (gastroesophageal reflux disease)      takes Protonix daily  . History of colon polyps   . CHF (congestive heart failure)     a. h/o severe LV dysfxn in setting of valvular dzs in 2008;  b. 12/2007 Echo: EF 55-60%, No RWMA, No AI, Mod Ao Root Dil, Triv MR/PR/TR, NL RV.  . S/P ascending aortic aneurysm repair 10/30/2012    Redo sternotomy for repair of ascending thoracic aortic aneurysm using Bentall aortic root replacement with [facility] Ease pericardial tissue valve-conduit (size 25mm valve, size 28mm graft conduit) with reimplantation of Left Main and Right Coronary Artery   . Left knee DJD 11/07/2012  . Type I or II open fracture of left tibial plateau with malunion 11/07/2012  . Pseudogout of left knee 11/07/2012    ROS: Negative except as per HPI  BP 142/90  Pulse 70  Ht 5\' 6"  (1.676 m)  Wt 206 lb 6.4 oz (93.622 kg)  BMI 33.33 kg/m2  PHYSICAL EXAM: Pt is alert and oriented, NAD HEENT: normal Neck: JVP - normal, carotids 2+= without bruits Lungs: CTA bilaterally CV: RRR without murmur or gallop Abd: soft, NT, Positive BS, no hepatomegaly Ext: no C/C/E, distal pulses intact and equal Skin: warm/dry no rash  EKG:  Normal sinus rhythm 71 beats per minute, sinus arrhythmia, otherwise within normal limits.  2D Echo 02/10/2013: Study Conclusions  - Left  ventricle: The cavity size was normal. Wall thickness was increased in a pattern of mild LVH. There was mild focal basal hypertrophy of the septum. Systolic function was normal. The estimated ejection fraction was in the range of 55% to 60%. Wall motion was normal; there were no regional wall motion abnormalities. Features are consistent with a pseudonormal left ventricular filling pattern, with concomitant abnormal relaxation and increased filling pressure (grade 2 diastolic dysfunction). Doppler parameters are consistent with high ventricular filling pressure. - Aortic valve: A bioprosthesis was present. - Mitral valve: Prior procedures included surgical  repair. The findings are consistent with mild stenosis. - Left atrium: The atrium was mildly dilated. Impressions:  - Compared to study of 10/01/12, prosthetic aortic valve and MV repair continue to function normally; previous severely dilated aortic root has been repaired.  ASSESSMENT AND PLAN:  1. Valvular heart disease with hx of bioprosthetic AVR, mitral repair, ascending thoracic aneurysm repair. Stable by post-op echo as above. No sx's of heart failure. Continue current Rx.   2. HTN - BP well-controlled on current Rx.  3. Preop CV eval: overall stable now 6 months out from [facility] surgery with well-controlled BP and no signs of angina or CHF. Ok to proceed with knee replacement without further CV testing.   Tonny Bollman 05/24/2013 9:28 PM

## 2013-06-08 ENCOUNTER — Telehealth: Payer: Self-pay | Admitting: Cardiovascular Disease

## 2013-06-08 NOTE — Telephone Encounter (Signed)
Left message to call back  

## 2013-06-08 NOTE — Telephone Encounter (Signed)
New Prob     Pt has some questions regarding his Metoprolol prescription. Also, pt wanted to notify office of documentation from his insurance company that will be faxed over. Please call.

## 2013-06-10 NOTE — Telephone Encounter (Signed)
Spoke with patient who states his insurance company had a question about metoprolol.  Patient states a form was faxed to us - states he will call back if there are any further problems.

## 2013-09-11 ENCOUNTER — Other Ambulatory Visit: Payer: Self-pay | Admitting: Cardiovascular Disease

## 2013-10-12 ENCOUNTER — Other Ambulatory Visit: Payer: Self-pay

## 2013-10-12 ENCOUNTER — Other Ambulatory Visit: Payer: Self-pay | Admitting: Cardiovascular Disease

## 2013-10-12 MED ORDER — METOPROLOL SUCCINATE ER 50 MG PO TB24: ORAL_TABLET | ORAL | Status: DC

## 2013-11-20 ENCOUNTER — Ambulatory Visit (INDEPENDENT_AMBULATORY_CARE_PROVIDER_SITE_OTHER): Payer: Medicare HMO | Admitting: Nurse Practitioner

## 2013-11-20 ENCOUNTER — Encounter: Payer: Self-pay | Admitting: Nurse Practitioner

## 2013-11-20 VITALS — BP 112/74 | HR 67 | Ht 66.0 in | Wt 211.0 lb

## 2013-11-20 DIAGNOSIS — Z9889 Other specified postprocedural states: Secondary | ICD-10-CM

## 2013-11-20 DIAGNOSIS — Z8679 Personal history of other diseases of the circulatory system: Secondary | ICD-10-CM

## 2013-11-20 DIAGNOSIS — E785 Hyperlipidemia, unspecified: Secondary | ICD-10-CM

## 2013-11-20 DIAGNOSIS — I10 Essential (primary) hypertension: Secondary | ICD-10-CM

## 2013-11-20 DIAGNOSIS — I38 Endocarditis, valve unspecified: Secondary | ICD-10-CM

## 2013-11-20 MED ORDER — PANTOPRAZOLE SODIUM 40 MG PO TBEC: 40.0000 mg | DELAYED_RELEASE_TABLET | Freq: Every day | ORAL | Status: DC

## 2013-11-20 NOTE — Patient Instructions (Addendum)
Stay on your current medicines.   I have sent in a prescription for Protonix 40 mg to take one a day - this should help with your belching/burping/reflux  We will check labs today  See Dr. Excell Seltzerooper in 6 months  Try to increase your activity - goal is up to 45 minutes to an hour a day  Call the [facility] office at [phone] if you have any questions, problems or concerns.

## 2013-11-20 NOTE — Progress Notes (Signed)
Thomas MccoyWillie D Henderson Date of Birth: 04/01/1941 Medical Record #[id number]  History of Present Illness: [name] is seen back today for a 6 month visit. Seen for Dr. Excell [facility]. He has had redo median sternotomy for repair of an ascending thoracic aortic aneurysm and aneurysm of the aortic root using a biological Bentall aortic root replacement with redo AVR on October 30, 2012 per Dr. Cornelius Henderson. He had a basically uncomplicated post op course. Prior to that he had to have his knee aspirated due to effusion and pseudogout.   His other issues include HTN, HLD, and originally had AVR and prior MV repair in 2008. He has had chronic DOE and fatigue.   Last seen here in February by Dr. Excell Henderson - was doing well. Given clearance for knee surgery.   Comes in today. Here alone. No cardiac issues. Has more GI issues with gas and indigestion/belching/burping. Has tried Nexium/prilosec OTC for a short course with some relief but it does not totally resolve. Recovering from his knee surgery. Not exercising as much as he should. Does have some dyspnea with over exertion. No chest pain.    Current Outpatient Prescriptions  Medication Sig Dispense Refill  . aspirin 81 MG tablet Take 81 mg by mouth daily.      . hydrochlorothiazide (HYDRODIURIL) 25 MG tablet Take 1 tablet (25 mg total) by mouth daily.  30 tablet  1  . losartan (COZAAR) 50 MG tablet Take 1 tablet (50 mg total) by mouth daily.  90 tablet  3  . metoprolol succinate (TOPROL-XL) 50 MG 24 hr tablet TAKE ONE TABLET BY MOUTH ONCE DAILY (TAKE WITH Henderson IMMEDIATELY FOLLOWING A MEAL  30 tablet  6  . simvastatin (ZOCOR) 20 MG tablet Take 20 mg by mouth at bedtime.         No current facility-administered medications for this visit.    No Known Allergies  Past Medical History  Diagnosis Date  . Mitral regurgitation     a. 2008 - MVR w/ 26mm annuloplasty ring;  b. 12/2007 Echo Triv MR  . Aortic insufficiency     a. 2008 - AVR w/ 25mm pericardial tissue valve;  a. 12/2007  Echo No AI.  Marland Kitchen. Dyslipidemia     takes Simvastatin daily  . ED (erectile dysfunction)   . Aortic root dilatation     a. 12/2007 Echo: Ao Root 48mm  . S/P aortic valve replacement with bioprosthetic valve 08/13/2006    #5225mm Edwards Magna pericardial tissue valve  . S/P mitral valve repair 08/13/2006    #2226mm [facility] Physio ring annuloplasty  . Thoracic ascending aortic aneurysm 09/07/2011  . HTN (hypertension)     takes Lisinopril and Metoprolol daily  . Intermittent lightheadedness   . Arthritis     legs  . Back pain     several back surgeries  . GERD (gastroesophageal reflux disease)     takes Protonix daily  . History of colon polyps   . CHF (congestive heart failure)     a. h/o severe LV dysfxn in setting of valvular dzs in 2008;  b. 12/2007 Echo: EF 55-60%, No RWMA, No AI, Mod Ao Root Dil, Triv MR/PR/TR, NL RV.  . S/P ascending aortic aneurysm repair 10/30/2012    Redo sternotomy for repair of ascending thoracic aortic aneurysm using Bentall aortic root replacement with [facility] Ease pericardial tissue valve-conduit (size 25mm valve, size 28mm graft conduit) with reimplantation of Left Main and Right Coronary Artery   . Left knee  DJD 11/07/2012  . Type I Henderson II open fracture of left tibial plateau with malunion 11/07/2012  . Pseudogout of left knee 11/07/2012    Past Surgical History  Procedure Laterality Date  . Leg surgery      left  . Eye surgery      left  . Tonsillectomy    . Lumbar laminectomy    . Lumbar disc surgery    . Aortic valve replacement  08/13/2006    #34mm [facility] pericardial tissue valve  . Mitral valve repair  08/13/2006    #60mm Edwards Physio ring annuloplasty  . Cardiac catheterization  07/31/06    10/13/12  . Colonoscopy    . Aortic valve replacement N/A 10/30/2012    Procedure: REDO AORTIC VALVE REPLACEMENT (AVR);  Surgeon: Thomas Nails, MD;  Location: [facility];  Service: Open Heart Surgery;  Laterality: N/A;  [facility] procedure N/A 10/30/2012     Procedure: BENTALL PROCEDURE;  Surgeon: Thomas Nails, MD;  Location: [facility];  Service: Open Heart Surgery;  Laterality: N/A;  . Replacement ascending aorta  10/30/2012    Procedure: REPLACEMENT ASCENDING AORTA;  Surgeon: Thomas Nails, MD;  Location: [facility];  Service: Open Heart Surgery;;    History  Smoking status  . Never Smoker   Smokeless tobacco  . Not on file    History  Alcohol Use No    Family History  Problem Relation Age of Onset  . Cardiomyopathy    . Heart attack Brother 65  . Other Mother 49    Enlarged heart    Review of Systems: The review of systems is per the HPI.  All other systems were reviewed and are negative.  Physical Exam: BP 112/74  Pulse 67  Ht 5\' 6"  (1.676 m)  Wt 211 lb (95.709 kg)  BMI 34.07 kg/m2  SpO2 99% Patient is very pleasant and in no acute distress. Weight is up about 5 pounds. Skin is warm and dry. Color is normal.  HEENT is unremarkable. Normocephalic/atraumatic. PERRL. Sclera are nonicteric. Neck is supple. No masses. No JVD. Lungs are clear. Cardiac exam shows a regular rate and rhythm. Abdomen is soft. Extremities are without edema. Gait and ROM are intact. No gross neurologic deficits noted.  Wt Readings from Last 3 Encounters:  11/20/13 211 lb (95.709 kg)  05/22/13 206 lb 6.4 oz (93.622 kg)  02/10/13 204 lb (92.534 kg)    LABORATORY DATA/PROCEDURES:  Lab Results  Component Value Date   WBC 5.2 02/10/2013   HGB 12.0* 02/10/2013   HCT 36.7* 02/10/2013   PLT 289.0 02/10/2013   GLUCOSE 91 02/10/2013   ALT 18 10/28/2012   AST 23 10/28/2012   NA 138 02/10/2013   K 3.5 02/10/2013   CL 104 02/10/2013   CREATININE 1.0 02/10/2013   BUN 21 02/10/2013   CO2 27 02/10/2013   INR 0.82 10/30/2012   HGBA1C 5.6 10/28/2012    BNP (last 3 results) No results found for this basename: PROBNP,  in the last 8760 hours   Echo Study Conclusions from November 2014  - Left ventricle: The cavity size was normal. Wall thickness was  increased in a pattern of mild LVH. There was mild focal basal hypertrophy of the septum. Systolic function was normal. The estimated ejection fraction was in the range of 55% to 60%. Wall motion was normal; there were no regional wall motion abnormalities. Features are consistent with a pseudonormal left ventricular filling pattern, with concomitant abnormal  relaxation and increased filling pressure (grade 2 diastolic dysfunction). Doppler parameters are consistent with high ventricular filling pressure. - Aortic valve: A bioprosthesis was present. - Mitral valve: Prior procedures included surgical repair. The findings are consistent with mild stenosis. - Left atrium: The atrium was mildly dilated. Impressions:  - Compared to study of 10/01/12, prosthetic aortic valve and MV repair continue to function normally; previous severely dilated aortic root has been repaired.    Assessment / Plan: 1. Valvular heart disease with hx of bioprosthetic AVR, mitral repair, ascending thoracic aneurysm repair. Stable by post-op echo as above. No sx's of heart failure. Continue current Rx.   2. HTN - BP well-controlled on current Rx.  3. HLD - rechecking labs today  4. GERD - will let him try Protonix - if no improvement, I have asked him to touch base with his PCP  5. DOE - most likely related to deconditioning - encouraged him to increase his activity/regular exercise program.  See back in 6 months. Check labs today.   Patient is agreeable to this plan and will call if any problems develop in the interim.   Rosalio Macadamia, RN, ANP-C [facility] [address] [place], [place]  [address] [phone]

## 2013-11-21 LAB — LIPID PANEL
Cholesterol: 97 mg/dL (ref 0–200)
HDL: 28.4 mg/dL — ABNORMAL LOW (ref >39.00)
LDL Cholesterol: 54 mg/dL (ref 0–99)
NonHDL: 68.6
Total CHOL/HDL Ratio: 3
Triglycerides: 74 mg/dL (ref 0.0–149.0)
VLDL: 14.8 mg/dL (ref 0.0–40.0)

## 2013-11-21 LAB — HEPATIC FUNCTION PANEL
ALT: 17 U/L (ref 0–53)
AST: 24 U/L (ref 0–37)
Albumin: 3.8 g/dL (ref 3.5–5.2)
Alkaline Phosphatase: 47 U/L (ref 39–117)
Bilirubin, Direct: 0 mg/dL (ref 0.0–0.3)
Total Bilirubin: 0.3 mg/dL (ref 0.2–1.2)
Total Protein: 7.5 g/dL (ref 6.0–8.3)

## 2013-11-21 LAB — BASIC METABOLIC PANEL
BUN: 23 mg/dL (ref 6–23)
CO2: 18 mEq/L — ABNORMAL LOW (ref 19–32)
Calcium: 9 mg/dL (ref 8.4–10.5)
Chloride: 107 mEq/L (ref 96–112)
Creatinine, Ser: 1.1 mg/dL (ref 0.4–1.5)
GFR: 82.13 mL/min (ref >60.00)
Glucose, Bld: 91 mg/dL (ref 70–99)
Potassium: 3.8 mEq/L (ref 3.5–5.1)
Sodium: 140 mEq/L (ref 135–145)
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
## 2014-03-11 ENCOUNTER — Encounter (HOSPITAL_COMMUNITY): Payer: Self-pay | Admitting: Cardiovascular Disease

## 2014-03-23 IMAGING — CR DG CHEST 2V
2 series · 2 of 2 positions shown · non-contrast
Comparison: 11/10/2012

CLINICAL DATA: Postop from repair of ascending thoracic aortic
aneurysm.

EXAM:
CHEST  2 VIEW

[w chest pa]
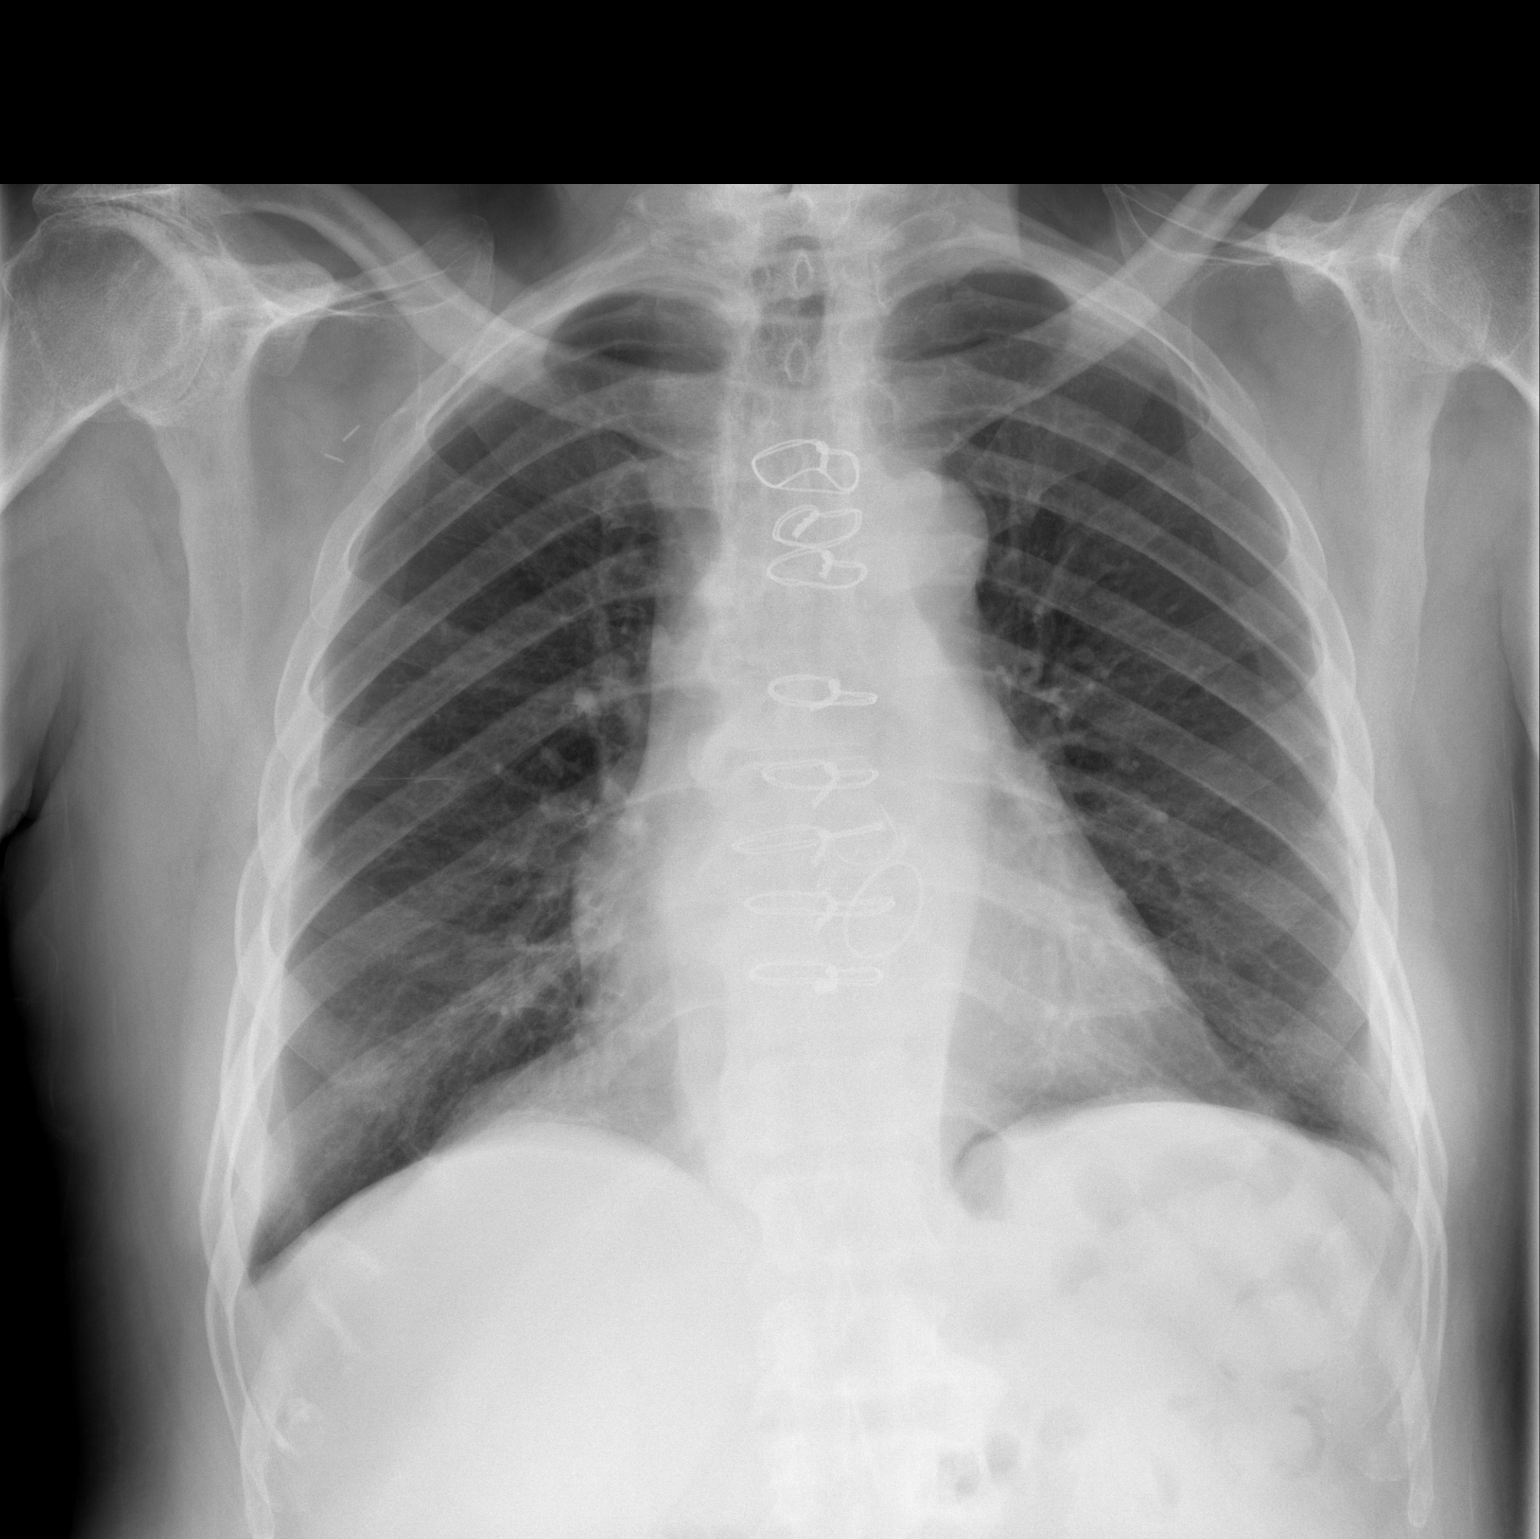

[w chest lat]
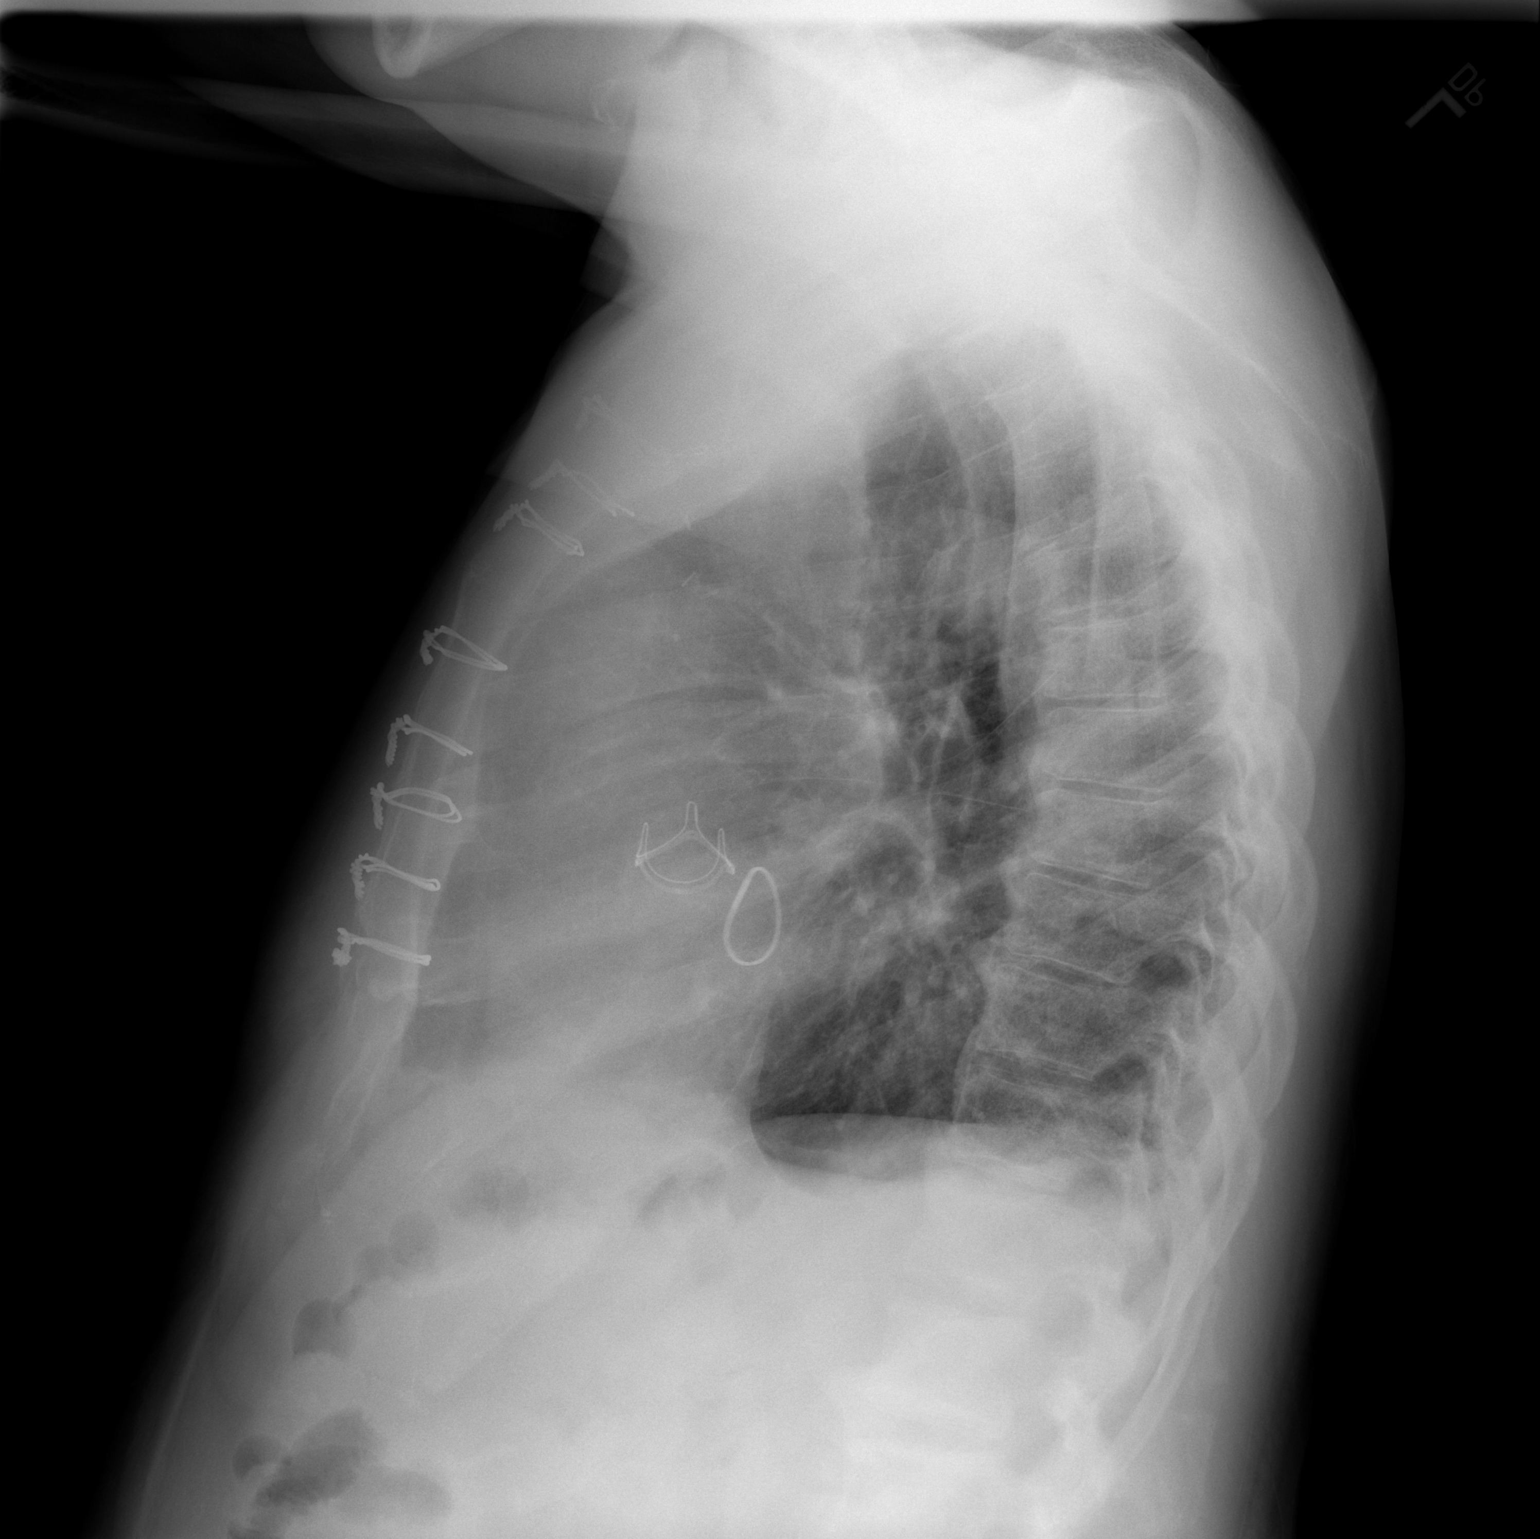

[2 of 2 positions shown; findings below may reference images not displayed]

FINDINGS: Pleural effusions and basilar atelectasis have resolved since
previous study. Both lungs are clear. No evidence of pneumothorax.
Heart size is within normal limits. Prior aortic and mitral valve
replacements again noted.
IMPRESSION: No active cardiopulmonary disease.

## 2014-05-19 ENCOUNTER — Ambulatory Visit (INDEPENDENT_AMBULATORY_CARE_PROVIDER_SITE_OTHER): Payer: PPO | Admitting: Cardiovascular Disease

## 2014-05-19 ENCOUNTER — Encounter: Payer: Self-pay | Admitting: Cardiovascular Disease

## 2014-05-19 VITALS — BP 116/82 | HR 63 | Ht 66.0 in | Wt 210.8 lb

## 2014-05-19 DIAGNOSIS — Z953 Presence of xenogenic heart valve: Secondary | ICD-10-CM

## 2014-05-19 DIAGNOSIS — Z9889 Other specified postprocedural states: Secondary | ICD-10-CM

## 2014-05-19 DIAGNOSIS — Z954 Presence of other heart-valve replacement: Secondary | ICD-10-CM

## 2014-05-19 DIAGNOSIS — I10 Essential (primary) hypertension: Secondary | ICD-10-CM

## 2014-05-19 NOTE — Progress Notes (Signed)
Cardiology Office Note   Date:  05/21/2014   ID:  [facility], DOB 08/23/1940, MRN [id number]  PCP:  Irena ReichmannOLLINS, DANA, DO  Cardiologist:  Tonny BollmanMichael Chantry Headen, MD    Chief Complaint  Patient presents with  . Dizziness  . Gastrophageal Reflux     History of Present Illness: [facility] is a 74 y.o. male who presents for follow-up of valvular heart disease and hypertension.  The patient initially underwent bioprosthetic aortic valve replacement and mitral valve replacement in 2008. He underwent redo sternotomy for repair of an ascending thoracic aortic aneurysm with redo bioprosthetic aortic valve replacement in July 2014.  He feels well. Specifically denies chest pain, shortness of breath, edema, or heart palpitations. He does admit to episodes of dizziness. Denies postural symptoms. Episodes last only for about 15 seconds then resolve spontaneously. Dizzy spells may occur as frequently as 2-3 times per week. He denies true sensation of presyncope and has never had frank syncope.     Past Medical History  Diagnosis Date  . Mitral regurgitation     a. 2008 - MVR w/ 26mm annuloplasty ring;  b. 12/2007 Echo Triv MR  . Aortic insufficiency     a. 2008 - AVR w/ 25mm pericardial tissue valve;  a. 12/2007 Echo No AI.  Marland Kitchen. Dyslipidemia     takes Simvastatin daily  . ED (erectile dysfunction)   . Aortic root dilatation     a. 12/2007 Echo: Ao Root 48mm  . S/P aortic valve replacement with bioprosthetic valve 08/13/2006    #5025mm Edwards Magna pericardial tissue valve  . S/P mitral valve repair 08/13/2006    #4926mm Edwards Physio ring annuloplasty  . Thoracic ascending aortic aneurysm 09/07/2011  . HTN (hypertension)     takes Lisinopril and Metoprolol daily  . Intermittent lightheadedness   . Arthritis     legs  . Back pain     several back surgeries  . GERD (gastroesophageal reflux disease)     takes Protonix daily  . History of colon polyps   . CHF (congestive heart failure)     a. h/o  severe LV dysfxn in setting of valvular dzs in 2008;  b. 12/2007 Echo: EF 55-60%, No RWMA, No AI, Mod Ao Root Dil, Triv MR/PR/TR, NL RV.  . S/P ascending aortic aneurysm repair 10/30/2012    Redo sternotomy for repair of ascending thoracic aortic aneurysm using Bentall aortic root replacement with [facility] Ease pericardial tissue valve-conduit (size 25mm valve, size 28mm graft conduit) with reimplantation of Left Main and Right Coronary Artery   . Left knee DJD 11/07/2012  . Type I or II open fracture of left tibial plateau with malunion 11/07/2012  . Pseudogout of left knee 11/07/2012    Past Surgical History  Procedure Laterality Date  . Leg surgery      left  . Eye surgery      left  . Tonsillectomy    . Lumbar laminectomy    . Lumbar disc surgery    . Aortic valve replacement  08/13/2006    #5525mm [facility] pericardial tissue valve  . Mitral valve repair  08/13/2006    #7026mm Edwards Physio ring annuloplasty  . Cardiac catheterization  07/31/06    10/13/12  . Colonoscopy    . Aortic valve replacement N/A 10/30/2012    Procedure: REDO AORTIC VALVE REPLACEMENT (AVR);  Surgeon: Purcell Nailslarence H Owen, MD;  [facility];  Service: Open Heart Surgery;  Laterality: N/A;  .  Bentall procedure N/A 10/30/2012    Procedure: BENTALL PROCEDURE;  Surgeon: Purcell Nails, MD;  Location: [facility] OR;  Service: Open Heart Surgery;  Laterality: N/A;  . Replacement ascending aorta  10/30/2012    Procedure: REPLACEMENT ASCENDING AORTA;  Surgeon: Purcell Nails, MD;  Location: [facility] OR;  Service: Open Heart Surgery;;  . Left and right heart catheterization with coronary angiogram N/A 10/13/2012    Procedure: LEFT AND RIGHT HEART CATHETERIZATION WITH CORONARY ANGIOGRAM;  Surgeon: Tonny Bollman, MD;  Location: [facility] CATH LAB;  Service: Cardiovascular;  Laterality: N/A;    Current Outpatient Prescriptions  Medication Sig Dispense Refill  . aspirin 81 MG tablet Take 81 mg by mouth daily.    . Cholecalciferol (VITAMIN D3)  2000 UNITS TABS Take 1 capsule by mouth daily.    . hydrochlorothiazide (HYDRODIURIL) 25 MG tablet Take 1 tablet (25 mg total) by mouth daily. 30 tablet 1  . losartan (COZAAR) 50 MG tablet Take 1 tablet (50 mg total) by mouth daily. 90 tablet 3  . metoprolol succinate (TOPROL-XL) 50 MG 24 hr tablet TAKE ONE TABLET BY MOUTH ONCE DAILY (TAKE WITH OR IMMEDIATELY FOLLOWING A MEAL 30 tablet 6  . simvastatin (ZOCOR) 20 MG tablet Take 20 mg by mouth at bedtime.       No current facility-administered medications for this visit.    Allergies:   Review of patient's allergies indicates no known allergies.   Social History:  The patient  reports that he has never smoked. He does not have any smokeless tobacco history on file. He reports that he does not drink alcohol or use illicit drugs.   Family History:  The patient's  family history includes Cardiomyopathy in an other family member; Heart attack (age of onset: 11) in his brother; Other (age of onset: 45) in his mother.    ROS:  Please see the history of present illness.  Otherwise, review of systems is positive for frequent belching and GERD symptoms, dizziness.  All other systems are reviewed and negative.   PHYSICAL EXAM: VS:  BP 116/82 mmHg  Pulse 63  Ht  (1.676 m)  Wt 210 lb 12.8 oz (95.618 kg)  BMI 34.04 kg/m2 , BMI Body mass index is 34.04 kg/(m^2). GEN: Well nourished, well developed, in no acute distress HEENT: normal Neck: no JVD, no masses, no carotid bruits Cardiac: RRR with grade 2/6 systolic murmur at the LSB Respiratory:  clear to auscultation bilaterally, normal work of breathing GI: soft, nontender, nondistended, + BS MS: no deformity or atrophy Ext: no pretibial edema Skin: warm and dry, no rash Neuro:  Strength and sensation are intact Psych: euthymic mood, full affect  EKG:  EKG is ordered today. The ekg ordered today shows NSR 63 bpm, sinus arrhythmia  Recent Labs: 11/20/2013: ALT 17; BUN 23; Creatinine 1.1;  Potassium 3.8; Sodium 140   Lipid Panel     Component Value Date/Time   CHOL 97 11/20/2013 0952   TRIG 74.0 11/20/2013 0952   HDL 28.40* 11/20/2013 0952   CHOLHDL 3 11/20/2013 0952   VLDL 14.8 11/20/2013 0952   LDLCALC 54 11/20/2013 0952      Wt Readings from Last 3 Encounters:  05/19/14 210 lb 12.8 oz (95.618 kg)  11/20/13 211 lb (95.709 kg)  05/22/13 206 lb 6.4 oz (93.622 kg)     Cardiac Studies Reviewed: 2D Echo 02/10/2013: Study Conclusions  - Left ventricle: The cavity size was normal. Wall thickness was increased in a pattern of  mild LVH. There was mild focal basal hypertrophy of the septum. Systolic function was normal. The estimated ejection fraction was in the range of 55% to 60%. Wall motion was normal; there were no regional wall motion abnormalities. Features are consistent with a pseudonormal left ventricular filling pattern, with concomitant abnormal relaxation and increased filling pressure (grade 2 diastolic dysfunction). Doppler parameters are consistent with high ventricular filling pressure. - Aortic valve: A bioprosthesis was present. - Mitral valve: Prior procedures included surgical repair. The findings are consistent with mild stenosis. - Left atrium: The atrium was mildly dilated. Impressions:  - Compared to study of 10/01/12, prosthetic aortic valve and MV repair continue to function normally; previous severely dilated aortic root has been repaired.  ASSESSMENT AND PLAN: 1.  Valvular heart disease s/p MVR/AVR. Doing well with no signs of CHF. Repeat 2D Echo this year. Follow-up one year.  2. HTN, essential: well-controlled on current medical Rx which was reviewed today.  3. Hyperlipidemia: lipids 10/2013 reviewed and chol 97, LDL 54, HDL 28, Trig 74. Med Rx reviewed.   Current medicines are reviewed with the patient today.  The patient does not have concerns regarding medicines.  The following changes have been  made:  no change  Labs/ tests ordered today include:   Orders Placed This Encounter  Procedures  . EKG 12-Lead  . 2D Echocardiogram without contrast    Disposition:   FU one year  Signed, Tonny Bollman, MD  05/21/2014 10:14 PM    [facility] [address] [place], [place], [place]  [address] Phone: [phone]; Fax: [phone]

## 2014-05-19 NOTE — Patient Instructions (Signed)
Your physician has requested that you have an echocardiogram. Echocardiography is a painless test that uses sound waves to create images of your heart. It provides your doctor with information about the size and shape of your heart and how well your heart's chambers and valves are working. This procedure takes approximately one hour. There are no restrictions for this procedure.  Your physician wants you to follow-up in: 1 YEAR with Dr Cooper. You will receive a reminder letter in the mail two months in advance. If you don't receive a letter, please call our office to schedule the follow-up appointment.  Your physician recommends that you continue on your current medications as directed. Please refer to the Current Medication list given to you today.  

## 2014-05-24 ENCOUNTER — Ambulatory Visit (HOSPITAL_COMMUNITY): Payer: PPO | Attending: Cardiovascular Disease | Admitting: Cardiology

## 2014-05-24 DIAGNOSIS — E785 Hyperlipidemia, unspecified: Secondary | ICD-10-CM | POA: Insufficient documentation

## 2014-05-24 DIAGNOSIS — Z9889 Other specified postprocedural states: Secondary | ICD-10-CM

## 2014-05-24 DIAGNOSIS — Z954 Presence of other heart-valve replacement: Secondary | ICD-10-CM | POA: Diagnosis not present

## 2014-05-24 DIAGNOSIS — I10 Essential (primary) hypertension: Secondary | ICD-10-CM | POA: Diagnosis not present

## 2014-05-24 DIAGNOSIS — Z953 Presence of xenogenic heart valve: Secondary | ICD-10-CM

## 2014-05-24 NOTE — Progress Notes (Signed)
Echo performed. 

## 2015-01-26 ENCOUNTER — Other Ambulatory Visit: Payer: Self-pay

## 2015-06-20 DIAGNOSIS — I10 Essential (primary) hypertension: Secondary | ICD-10-CM | POA: Diagnosis not present

## 2015-06-20 DIAGNOSIS — I739 Peripheral vascular disease, unspecified: Secondary | ICD-10-CM | POA: Diagnosis not present

## 2015-06-20 DIAGNOSIS — E785 Hyperlipidemia, unspecified: Secondary | ICD-10-CM | POA: Diagnosis not present

## 2015-06-23 DIAGNOSIS — M79605 Pain in left leg: Secondary | ICD-10-CM | POA: Diagnosis not present

## 2015-06-23 DIAGNOSIS — M79604 Pain in right leg: Secondary | ICD-10-CM | POA: Diagnosis not present

## 2015-07-12 DIAGNOSIS — E7800 Pure hypercholesterolemia, unspecified: Secondary | ICD-10-CM | POA: Diagnosis not present

## 2015-07-12 DIAGNOSIS — M79604 Pain in right leg: Secondary | ICD-10-CM | POA: Diagnosis not present

## 2015-07-12 DIAGNOSIS — D649 Anemia, unspecified: Secondary | ICD-10-CM | POA: Diagnosis not present

## 2015-07-12 DIAGNOSIS — G8929 Other chronic pain: Secondary | ICD-10-CM | POA: Diagnosis not present

## 2015-07-12 DIAGNOSIS — I10 Essential (primary) hypertension: Secondary | ICD-10-CM | POA: Diagnosis not present

## 2015-07-12 DIAGNOSIS — Z125 Encounter for screening for malignant neoplasm of prostate: Secondary | ICD-10-CM | POA: Diagnosis not present

## 2015-07-12 DIAGNOSIS — M79605 Pain in left leg: Secondary | ICD-10-CM | POA: Diagnosis not present

## 2015-07-12 DIAGNOSIS — M5442 Lumbago with sciatica, left side: Secondary | ICD-10-CM | POA: Diagnosis not present

## 2015-07-12 DIAGNOSIS — Z1329 Encounter for screening for other suspected endocrine disorder: Secondary | ICD-10-CM | POA: Diagnosis not present

## 2015-07-12 DIAGNOSIS — R7309 Other abnormal glucose: Secondary | ICD-10-CM | POA: Diagnosis not present

## 2015-08-08 DIAGNOSIS — M5136 Other intervertebral disc degeneration, lumbar region: Secondary | ICD-10-CM | POA: Diagnosis not present

## 2015-08-08 DIAGNOSIS — G8929 Other chronic pain: Secondary | ICD-10-CM | POA: Diagnosis not present

## 2015-08-08 DIAGNOSIS — M545 Low back pain: Secondary | ICD-10-CM | POA: Diagnosis not present

## 2015-08-08 DIAGNOSIS — M79605 Pain in left leg: Secondary | ICD-10-CM | POA: Diagnosis not present

## 2015-08-08 DIAGNOSIS — M25552 Pain in left hip: Secondary | ICD-10-CM | POA: Diagnosis not present

## 2015-08-08 DIAGNOSIS — M25551 Pain in right hip: Secondary | ICD-10-CM | POA: Diagnosis not present

## 2015-08-08 DIAGNOSIS — M160 Bilateral primary osteoarthritis of hip: Secondary | ICD-10-CM | POA: Diagnosis not present

## 2015-08-08 DIAGNOSIS — M7122 Synovial cyst of popliteal space [Baker], left knee: Secondary | ICD-10-CM | POA: Diagnosis not present

## 2015-08-08 DIAGNOSIS — R102 Pelvic and perineal pain: Secondary | ICD-10-CM | POA: Diagnosis not present

## 2015-08-10 NOTE — Progress Notes (Signed)
Cardiology Office Note Date:  08/11/2015   ID:  Thomas Henderson, DOB [birth year], MRN [id number]  PCP:  Irena Reichmann, DO  Cardiologist:  Tonny Bollman, MD    Chief Complaint  Patient presents with  . valvular heart disease  . essential hypertension  . Hyperlipidemia    History of Present Illness: Thomas Henderson is a 75 y.o. male who presents for follow-up of valvular heart disease and hypertension. The patient initially underwent bioprosthetic aortic valve replacement and mitral valve replacement in 2008. He underwent redo sternotomy for repair of an ascending thoracic aortic aneurysm with redo bioprosthetic aortic valve replacement in July 2014.  The patient has done well since his last office visit here. He's followed regularly by primary care and has his labs drawn through that office. He's had no chest pain, shortness of breath, leg swelling, orthopnea, PND, or heart palpitations. He's compliant with his medications. He's had some limitation related to his left knee and he underwent knee replacement last year.   Past Medical History  Diagnosis Date  . Mitral regurgitation     a. 2008 - MVR w/ 26mm annuloplasty ring;  b. 12/2007 Echo Triv MR  . Aortic insufficiency     a. 2008 - AVR w/ 25mm pericardial tissue valve;  a. 12/2007 Echo No AI.  Marland Kitchen Dyslipidemia     takes Simvastatin daily  . ED (erectile dysfunction)   . Aortic root dilatation (HCC)     a. 12/2007 Echo: Ao Root 48mm  . S/P aortic valve replacement with bioprosthetic valve 08/13/2006    #61mm Edwards Magna pericardial tissue valve  . S/P mitral valve repair 08/13/2006    #54mm Edwards Physio ring annuloplasty  . Thoracic ascending aortic aneurysm (HCC) 09/07/2011  . HTN (hypertension)     takes Lisinopril and Metoprolol daily  . Intermittent lightheadedness   . Arthritis     legs  . Back pain     several back surgeries  . GERD (gastroesophageal reflux disease)     takes Protonix daily  . History of colon polyps     . CHF (congestive heart failure) (HCC)     a. h/o severe LV dysfxn in setting of valvular dzs in 2008;  b. 12/2007 Echo: EF 55-60%, No RWMA, No AI, Mod Ao Root Dil, Triv MR/PR/TR, NL RV.  . S/P ascending aortic aneurysm repair 10/30/2012    Redo sternotomy for repair of ascending thoracic aortic aneurysm using Bentall aortic root replacement with [facility] Ease pericardial tissue valve-conduit (size 25mm valve, size 28mm graft conduit) with reimplantation of Left Main and Right Coronary Artery   . Left knee DJD 11/07/2012  . Type I or II open fracture of left tibial plateau with malunion 11/07/2012  . Pseudogout of left knee 11/07/2012    Past Surgical History  Procedure Laterality Date  . Leg surgery      left  . Eye surgery      left  . Tonsillectomy    . Lumbar laminectomy    . Lumbar disc surgery    . Aortic valve replacement  08/13/2006    #60mm [facility] pericardial tissue valve  . Mitral valve repair  08/13/2006    #12mm Edwards Physio ring annuloplasty  . Cardiac catheterization  07/31/06    10/13/12  . Colonoscopy    . Aortic valve replacement N/A 10/30/2012    Procedure: REDO AORTIC VALVE REPLACEMENT (AVR);  Surgeon: Purcell Nails, MD;  Location: [facility] OR;  Service: Open  Heart Surgery;  Laterality: N/A;  Zachary George procedure N/A 10/30/2012    Procedure: BENTALL PROCEDURE;  Surgeon: Purcell Nails, MD;  Location: [facility] OR;  Service: Open Heart Surgery;  Laterality: N/A;  . Replacement ascending aorta  10/30/2012    Procedure: REPLACEMENT ASCENDING AORTA;  Surgeon: Purcell Nails, MD;  Location: [facility] OR;  Service: Open Heart Surgery;;  . Left and right heart catheterization with coronary angiogram N/A 10/13/2012    Procedure: LEFT AND RIGHT HEART CATHETERIZATION WITH CORONARY ANGIOGRAM;  Surgeon: Tonny Bollman, MD;  Location: [facility] CATH LAB;  Service: Cardiovascular;  Laterality: N/A;    Current Outpatient Prescriptions  Medication Sig Dispense Refill  . aspirin 81 MG tablet Take 81  mg by mouth daily.    . Cholecalciferol (VITAMIN D3) 2000 UNITS TABS Take 1 capsule by mouth daily.    . hydrochlorothiazide (HYDRODIURIL) 25 MG tablet Take 1 tablet (25 mg total) by mouth daily. 30 tablet 1  . losartan (COZAAR) 50 MG tablet Take 1 tablet (50 mg total) by mouth daily. 90 tablet 3  . metoprolol succinate (TOPROL-XL) 50 MG 24 hr tablet TAKE ONE TABLET BY MOUTH ONCE DAILY (TAKE WITH OR IMMEDIATELY FOLLOWING A MEAL 30 tablet 6  . simvastatin (ZOCOR) 20 MG tablet Take 20 mg by mouth at bedtime.       No current facility-administered medications for this visit.    Allergies:   Review of patient's allergies indicates no known allergies.   Social History:  The patient  reports that he has never smoked. He does not have any smokeless tobacco history on file. He reports that he does not drink alcohol or use illicit drugs.   Family History:  The patient's family history includes Heart attack (age of onset: 63) in his brother; Other (age of onset: 15) in his mother.    ROS:  Please see the history of present illness.  Otherwise, review of systems is positive for Back pain, muscle pain.  All other systems are reviewed and negative.    PHYSICAL EXAM: VS:  BP 144/86 mmHg  Pulse 60 , BMI There is no weight on file to calculate BMI. GEN: Well nourished, well developed, in no acute distress HEENT: normal Neck: no JVD, no masses. No carotid bruits Cardiac: RRR with 2/6 SEM at the RUSB, no diastolic murmur           Respiratory:  clear to auscultation bilaterally, normal work of breathing GI: soft, nontender, nondistended, + BS MS: no deformity or atrophy Ext: trace pretibial edema, pedal pulses 2+= bilaterally Skin: warm and dry, no rash Neuro:  Strength and sensation are intact Psych: euthymic mood, full affect  EKG:  EKG is ordered today. The ekg ordered today shows normal sinus rhythm 65 bpm, left ventricular hypertrophy, otherwise within normal limits.  Recent Labs: No  results found for requested labs within last 365 days.   Lipid Panel     Component Value Date/Time   CHOL 97 11/20/2013 0952   TRIG 74.0 11/20/2013 0952   HDL 28.40* 11/20/2013 0952   CHOLHDL 3 11/20/2013 0952   VLDL 14.8 11/20/2013 0952   LDLCALC 54 11/20/2013 0952      Wt Readings from Last 3 Encounters:  05/19/14 210 lb 12.8 oz (95.618 kg)  11/20/13 211 lb (95.709 kg)  05/22/13 206 lb 6.4 oz (93.622 kg)     Cardiac Studies Reviewed: 2D Echo 05/24/2014: Study Conclusions  - Left ventricle: The cavity size was normal. Systolic function was  normal. The estimated ejection fraction was in the range of 55% to 60%. Wall motion was normal; there were no regional wall motion abnormalities. - Aortic valve: Tissue AVR with normal gradients and no perivalvular regurgitation. - Mitral valve: Post mitral valve repair with no MR and normal diatolic gradients. E/E&' lateral elevated but not likely reflective of EDP given annuloplasty ring. - Left atrium: The atrium was mildly to moderately dilated. - Atrial septum: No defect or patent foramen ovale was identified.  ASSESSMENT AND PLAN: 1.  Valvular heart disease status post mitral valve repair and bioprosthetic aortic valve replacement. [facility] with redo aortic valve replacement. He is doing quite well. He has NYHA functional class I symptoms. He will be continued on current medical therapy. We discussed the importance of increasing exercise and focusing on weight loss. His post-operative studies have shown intact MV repair and normal Ao Valve bioprosthetic function. Will repeat a 2D Echo prior to office visit next year.  2. Ascending thoracic aortic aneurysm status post repair: The patient is asymptomatic. He probably should have follow-up imaging and I will touch base with Dr. Cornelius Moraswen as to the best imaging modality.  3. Essential hypertension: Blood pressure on my  recheck is 126/70 and he will be continued on his current medicines.  Current medicines are reviewed with the patient today.  The patient does not have concerns regarding medicines.  Labs/ tests ordered today include:   Orders Placed This Encounter  Procedures  . EKG 12-Lead    Disposition:   FU one year  Signed, Tonny Bollmanooper, Gurtej Noyola, MD  08/11/2015 5:34 PM    [facility] [address], [place], [place]  [address] Phone: [phone]) [phone]; Fax: [phone]) [phone]

## 2015-08-11 ENCOUNTER — Ambulatory Visit (INDEPENDENT_AMBULATORY_CARE_PROVIDER_SITE_OTHER): Payer: PPO | Admitting: Cardiovascular Disease

## 2015-08-11 ENCOUNTER — Encounter: Payer: Self-pay | Admitting: Cardiovascular Disease

## 2015-08-11 VITALS — BP 144/86 | HR 60

## 2015-08-11 DIAGNOSIS — Z9889 Other specified postprocedural states: Secondary | ICD-10-CM

## 2015-08-11 DIAGNOSIS — I10 Essential (primary) hypertension: Secondary | ICD-10-CM

## 2015-08-11 DIAGNOSIS — I38 Endocarditis, valve unspecified: Secondary | ICD-10-CM | POA: Diagnosis not present

## 2015-08-11 DIAGNOSIS — Z8679 Personal history of other diseases of the circulatory system: Secondary | ICD-10-CM

## 2015-08-11 NOTE — Patient Instructions (Signed)

## 2015-08-16 DIAGNOSIS — M5442 Lumbago with sciatica, left side: Secondary | ICD-10-CM | POA: Diagnosis not present

## 2015-08-16 DIAGNOSIS — Z96652 Presence of left artificial knee joint: Secondary | ICD-10-CM | POA: Diagnosis not present

## 2015-08-17 DIAGNOSIS — Z125 Encounter for screening for malignant neoplasm of prostate: Secondary | ICD-10-CM | POA: Diagnosis not present

## 2015-08-17 DIAGNOSIS — E7800 Pure hypercholesterolemia, unspecified: Secondary | ICD-10-CM | POA: Diagnosis not present

## 2015-08-17 DIAGNOSIS — I10 Essential (primary) hypertension: Secondary | ICD-10-CM | POA: Diagnosis not present

## 2015-08-17 DIAGNOSIS — R7309 Other abnormal glucose: Secondary | ICD-10-CM | POA: Diagnosis not present

## 2015-08-19 ENCOUNTER — Telehealth: Payer: Self-pay

## 2015-08-19 DIAGNOSIS — Z8679 Personal history of other diseases of the circulatory system: Secondary | ICD-10-CM

## 2015-08-19 DIAGNOSIS — Z9889 Other specified postprocedural states: Principal | ICD-10-CM

## 2015-08-19 NOTE — Telephone Encounter (Signed)
Thomas Henderson - can you order a CTA chest on him? Let's do an echo before his next OV. thx     ----- Message -----     From: Purcell Nailslarence H Owen, MD     Sent: 08/12/2015  8:37 AM      To: Tonny BollmanMichael Cooper, MD        Non-gated CTA should be fine since his root was replaced. It's really just the descending aorta that needs f/u unless they see something worrisome in the proximal aorta        ----- Message -----     From: Tonny BollmanMichael Cooper, MD     Sent: 08/11/2015  5:47 PM      To: Purcell Nailslarence H Owen, MD        Cub - I saw Thomas Henderson today and he's doing fine. Should he have a CTA of the chest to follow his Bentall surgery from 2014? If so, is a gated scan best or just regular chest CTA? I plan to check a surface echo next year as his post-op studies have been fine.        thx

## 2015-08-25 DIAGNOSIS — M47817 Spondylosis without myelopathy or radiculopathy, lumbosacral region: Secondary | ICD-10-CM | POA: Diagnosis not present

## 2015-08-25 DIAGNOSIS — M4806 Spinal stenosis, lumbar region: Secondary | ICD-10-CM | POA: Diagnosis not present

## 2015-08-30 DIAGNOSIS — M5442 Lumbago with sciatica, left side: Secondary | ICD-10-CM | POA: Diagnosis not present

## 2015-08-30 DIAGNOSIS — M4806 Spinal stenosis, lumbar region: Secondary | ICD-10-CM | POA: Diagnosis not present

## 2015-08-30 NOTE — Telephone Encounter (Signed)
Left message on machine for pt to contact the office.   The pt will need a BMP prior to scheduling CTA of chest.

## 2015-08-31 DIAGNOSIS — R52 Pain, unspecified: Secondary | ICD-10-CM | POA: Diagnosis not present

## 2015-08-31 DIAGNOSIS — M79609 Pain in unspecified limb: Secondary | ICD-10-CM | POA: Diagnosis not present

## 2015-08-31 DIAGNOSIS — E559 Vitamin D deficiency, unspecified: Secondary | ICD-10-CM | POA: Diagnosis not present

## 2015-08-31 DIAGNOSIS — Z79899 Other long term (current) drug therapy: Secondary | ICD-10-CM | POA: Diagnosis not present

## 2015-08-31 DIAGNOSIS — Z01818 Encounter for other preprocedural examination: Secondary | ICD-10-CM | POA: Diagnosis not present

## 2015-08-31 DIAGNOSIS — Z01810 Encounter for preprocedural cardiovascular examination: Secondary | ICD-10-CM | POA: Diagnosis not present

## 2015-08-31 NOTE — Telephone Encounter (Signed)
I spoke with the Thomas Henderson and he said that he had lab work drawn today at [facility] as part of a pre-op work up for back surgery.  The Thomas Henderson also said he had an MRI performed recently at an outside facility in the [place] area.  I made him aware that I do not have access to these records and if he can obtain a copy and have them faxed to our office then I am happy to review them and determine if they obtained information in regards to aorta.  Fax number provided to the Thomas Henderson and he will attempt to obtain records. The Thomas Henderson also said that the surgeon is suppose to contact our office to address surgical clearance.

## 2015-09-06 NOTE — Telephone Encounter (Signed)
Records received and MRI Lumbar Spine without contrast did not show anything in regards to aorta. BMP was done 5/31 and showed BUN 22 and Creatinine 1.00. Request received for cardiac clearance in regards to Laminectomy and discectomy at lumbar 3-4, 4-5 by Garnette GunnerShakeel Durrani, MD. I will place this request in Dr Earmon Phoenixooper's folder for review. Surgery is not scheduled at this time. I have placed an order for CTA chest and will determine if this is needed prior to giving cardiac clearance.

## 2015-09-07 NOTE — Telephone Encounter (Signed)
Dr Excell Seltzerooper completed surgical clearance form.  Per Dr Excell Seltzerooper the pt does not require CTA prior to surgery.  I left the pt a message to contact the office to discuss CTA. The pt can go ahead and have this done before surgery or proceed after surgery depending on pt preference.

## 2015-09-08 NOTE — Telephone Encounter (Signed)
Pt is aware of Dr. Earmon Phoenixooper's  surgical clearance and  recommendations as written bellow per Iona CoachLauren W Brown RN. Pt verbalized understanding.

## 2015-09-08 NOTE — Telephone Encounter (Signed)
F/u  Pt returning RN phone call. Please call back and discuss.   

## 2015-09-09 ENCOUNTER — Telehealth: Payer: Self-pay | Admitting: Cardiovascular Disease

## 2015-09-09 NOTE — Telephone Encounter (Signed)
F/u  Message   Pt returning RN calling. PLEASE call back to discuss

## 2015-09-09 NOTE — Telephone Encounter (Signed)
I spoke with the pt and he did not understand the information that [place] discussed with him yesterday. I made the pt aware that he is cleared for surgery. CTA scheduled on 09/15/15 in our office.  Pt is aware nothing to eat or drink 4 hours prior to test.

## 2015-09-13 ENCOUNTER — Encounter: Payer: Self-pay | Admitting: Cardiovascular Disease

## 2015-09-15 ENCOUNTER — Ambulatory Visit (INDEPENDENT_AMBULATORY_CARE_PROVIDER_SITE_OTHER)
Admission: RE | Admit: 2015-09-15 | Discharge: 2015-09-15 | Disposition: A | Discharge status: Home / Self Care | Payer: PPO | Source: Ambulatory Visit | Attending: Cardiovascular Disease | Admitting: Cardiovascular Disease

## 2015-09-15 DIAGNOSIS — Z8679 Personal history of other diseases of the circulatory system: Secondary | ICD-10-CM | POA: Diagnosis not present

## 2015-09-15 DIAGNOSIS — Z9889 Other specified postprocedural states: Secondary | ICD-10-CM | POA: Diagnosis not present

## 2015-09-15 DIAGNOSIS — I712 Thoracic aortic aneurysm, without rupture: Secondary | ICD-10-CM | POA: Diagnosis not present

## 2015-09-15 MED ORDER — IOPAMIDOL (ISOVUE-370) INJECTION 76%
100.0000 mL | Freq: Once | INTRAVENOUS | Status: AC | PRN
  Administered 2015-09-15: 100 mL via INTRAVENOUS

## 2015-09-16 ENCOUNTER — Telehealth: Payer: Self-pay | Admitting: Cardiovascular Disease

## 2015-09-16 NOTE — Telephone Encounter (Signed)
F/u  Pt returning RN phone call- CT results. Please call back and discuss.   

## 2015-09-16 NOTE — Telephone Encounter (Signed)
PT  AWARE OF  CT RESULTS ./CY 

## 2015-09-19 DIAGNOSIS — I509 Heart failure, unspecified: Secondary | ICD-10-CM | POA: Diagnosis not present

## 2015-09-19 DIAGNOSIS — Z79899 Other long term (current) drug therapy: Secondary | ICD-10-CM | POA: Diagnosis not present

## 2015-09-19 DIAGNOSIS — R202 Paresthesia of skin: Secondary | ICD-10-CM | POA: Diagnosis not present

## 2015-09-19 DIAGNOSIS — E7800 Pure hypercholesterolemia, unspecified: Secondary | ICD-10-CM | POA: Diagnosis not present

## 2015-09-19 DIAGNOSIS — G8929 Other chronic pain: Secondary | ICD-10-CM | POA: Diagnosis not present

## 2015-09-19 DIAGNOSIS — Z7982 Long term (current) use of aspirin: Secondary | ICD-10-CM | POA: Diagnosis not present

## 2015-09-19 DIAGNOSIS — K219 Gastro-esophageal reflux disease without esophagitis: Secondary | ICD-10-CM | POA: Diagnosis not present

## 2015-09-19 DIAGNOSIS — M4806 Spinal stenosis, lumbar region: Secondary | ICD-10-CM | POA: Diagnosis not present

## 2015-09-19 DIAGNOSIS — Z96652 Presence of left artificial knee joint: Secondary | ICD-10-CM | POA: Diagnosis not present

## 2015-09-19 DIAGNOSIS — M5442 Lumbago with sciatica, left side: Secondary | ICD-10-CM | POA: Diagnosis not present

## 2015-09-19 DIAGNOSIS — Z952 Presence of prosthetic heart valve: Secondary | ICD-10-CM | POA: Diagnosis not present

## 2015-09-19 DIAGNOSIS — M1990 Unspecified osteoarthritis, unspecified site: Secondary | ICD-10-CM | POA: Diagnosis not present

## 2015-09-19 DIAGNOSIS — I119 Hypertensive heart disease without heart failure: Secondary | ICD-10-CM | POA: Diagnosis not present

## 2015-09-19 DIAGNOSIS — M79605 Pain in left leg: Secondary | ICD-10-CM | POA: Diagnosis not present

## 2015-09-19 DIAGNOSIS — I110 Hypertensive heart disease with heart failure: Secondary | ICD-10-CM | POA: Diagnosis not present

## 2015-11-16 DIAGNOSIS — M5442 Lumbago with sciatica, left side: Secondary | ICD-10-CM | POA: Diagnosis not present

## 2015-12-28 DIAGNOSIS — M5442 Lumbago with sciatica, left side: Secondary | ICD-10-CM | POA: Diagnosis not present

## 2016-01-09 DIAGNOSIS — Z23 Encounter for immunization: Secondary | ICD-10-CM | POA: Diagnosis not present

## 2016-02-06 DIAGNOSIS — Z0000 Encounter for general adult medical examination without abnormal findings: Secondary | ICD-10-CM | POA: Diagnosis not present

## 2016-02-06 DIAGNOSIS — Z87438 Personal history of other diseases of male genital organs: Secondary | ICD-10-CM | POA: Diagnosis not present

## 2016-02-06 DIAGNOSIS — I10 Essential (primary) hypertension: Secondary | ICD-10-CM | POA: Diagnosis not present

## 2016-02-06 DIAGNOSIS — E785 Hyperlipidemia, unspecified: Secondary | ICD-10-CM | POA: Diagnosis not present

## 2016-02-06 DIAGNOSIS — E559 Vitamin D deficiency, unspecified: Secondary | ICD-10-CM | POA: Diagnosis not present

## 2016-03-28 DIAGNOSIS — M5442 Lumbago with sciatica, left side: Secondary | ICD-10-CM | POA: Diagnosis not present

## 2016-08-06 DIAGNOSIS — R7303 Prediabetes: Secondary | ICD-10-CM | POA: Diagnosis not present

## 2016-08-06 DIAGNOSIS — E785 Hyperlipidemia, unspecified: Secondary | ICD-10-CM | POA: Diagnosis not present

## 2016-08-06 DIAGNOSIS — Z125 Encounter for screening for malignant neoplasm of prostate: Secondary | ICD-10-CM | POA: Diagnosis not present

## 2016-08-06 DIAGNOSIS — I10 Essential (primary) hypertension: Secondary | ICD-10-CM | POA: Diagnosis not present

## 2016-08-08 DIAGNOSIS — Z125 Encounter for screening for malignant neoplasm of prostate: Secondary | ICD-10-CM | POA: Diagnosis not present

## 2016-08-08 DIAGNOSIS — Z9889 Other specified postprocedural states: Secondary | ICD-10-CM | POA: Diagnosis not present

## 2016-08-08 DIAGNOSIS — R7303 Prediabetes: Secondary | ICD-10-CM | POA: Diagnosis not present

## 2016-08-08 DIAGNOSIS — R7309 Other abnormal glucose: Secondary | ICD-10-CM | POA: Diagnosis not present

## 2016-08-08 DIAGNOSIS — Z0000 Encounter for general adult medical examination without abnormal findings: Secondary | ICD-10-CM | POA: Diagnosis not present

## 2016-08-08 DIAGNOSIS — D509 Iron deficiency anemia, unspecified: Secondary | ICD-10-CM | POA: Diagnosis not present

## 2016-08-08 DIAGNOSIS — Z952 Presence of prosthetic heart valve: Secondary | ICD-10-CM | POA: Diagnosis not present

## 2016-08-08 DIAGNOSIS — R42 Dizziness and giddiness: Secondary | ICD-10-CM | POA: Diagnosis not present

## 2016-08-08 DIAGNOSIS — E785 Hyperlipidemia, unspecified: Secondary | ICD-10-CM | POA: Diagnosis not present

## 2016-08-08 DIAGNOSIS — I10 Essential (primary) hypertension: Secondary | ICD-10-CM | POA: Diagnosis not present

## 2016-08-08 DIAGNOSIS — E559 Vitamin D deficiency, unspecified: Secondary | ICD-10-CM | POA: Diagnosis not present

## 2016-08-08 DIAGNOSIS — Z8679 Personal history of other diseases of the circulatory system: Secondary | ICD-10-CM | POA: Diagnosis not present

## 2016-08-23 ENCOUNTER — Encounter: Payer: Self-pay | Admitting: Cardiovascular Disease

## 2016-09-10 ENCOUNTER — Encounter: Payer: Self-pay | Admitting: Cardiovascular Disease

## 2016-09-10 ENCOUNTER — Ambulatory Visit (INDEPENDENT_AMBULATORY_CARE_PROVIDER_SITE_OTHER): Payer: PPO | Admitting: Cardiovascular Disease

## 2016-09-10 VITALS — BP 130/80 | HR 70 | Ht 66.0 in | Wt 209.8 lb

## 2016-09-10 DIAGNOSIS — I38 Endocarditis, valve unspecified: Secondary | ICD-10-CM

## 2016-09-10 DIAGNOSIS — I10 Essential (primary) hypertension: Secondary | ICD-10-CM

## 2016-09-10 NOTE — Progress Notes (Signed)
Cardiology Office Note Date:  09/12/2016   ID:  Thomas Henderson, DOB 05/05/1940, MRN [id number]  PCP:  Irena Reichmannollins, Dana, DO  Cardiologist:  Tonny Bollmanooper, Io Dieujuste, MD    Chief Complaint  Patient presents with  . Follow-up    1 year     History of Present Illness: Thomas MccoyWillie D Henderson is a 76 y.o. male who presents for  follow-up of valvular heart disease and hypertension. The patient initially underwent bioprosthetic aortic valve replacement and mitral valve replacement in 2008. He underwent redo sternotomy for repair of an ascending thoracic aortic aneurysm with redo bioprosthetic aortic valve replacement in 2014.  The patient is here alone today. He has had an uneventful year with his health. He denies any chest pain, chest pressure, or shortness of breath at rest. He admits to shortness of breath when he does a lot of walking. He continues to have some limitation from knee pain. He denies orthopnea, PND, lightheadedness, or heart palpitations.  Past Medical History:  Diagnosis Date  . Aortic insufficiency    a. 2008 - AVR w/ 25mm pericardial tissue valve;  a. 12/2007 Echo No AI.  Marland Kitchen. Aortic root dilatation (HCC)    a. 12/2007 Echo: Ao Root 48mm  . Arthritis    legs  . Back pain    several back surgeries  . CHF (congestive heart failure) (HCC)    a. h/o severe LV dysfxn in setting of valvular dzs in 2008;  b. 12/2007 Echo: EF 55-60%, No RWMA, No AI, Mod Ao Root Dil, Triv MR/PR/TR, NL RV.  Marland Kitchen. Dyslipidemia    takes Simvastatin daily  . ED (erectile dysfunction)   . GERD (gastroesophageal reflux disease)    takes Protonix daily  . History of colon polyps   . HTN (hypertension)    takes Lisinopril and Metoprolol daily  . Intermittent lightheadedness   . Left knee DJD 11/07/2012  . Mitral regurgitation    a. 2008 - MVR w/ 26mm annuloplasty ring;  b. 12/2007 Echo Triv MR  . Pseudogout of left knee 11/07/2012  . S/P aortic valve replacement with bioprosthetic valve 08/13/2006   #6425mm Edwards Magna  pericardial tissue valve  . S/P ascending aortic aneurysm repair 10/30/2012   Redo sternotomy for repair of ascending thoracic aortic aneurysm using Bentall aortic root replacement with [facility] Ease pericardial tissue valve-conduit (size 25mm valve, size 28mm graft conduit) with reimplantation of Left Main and Right Coronary Artery   . S/P mitral valve repair 08/13/2006   #5926mm Edwards Physio ring annuloplasty  . Thoracic ascending aortic aneurysm (HCC) 09/07/2011  . Type I or II open fracture of left tibial plateau with malunion 11/07/2012    Past Surgical History:  Procedure Laterality Date  . AORTIC VALVE REPLACEMENT  08/13/2006   #5825mm [facility] Magna pericardial tissue valve  . AORTIC VALVE REPLACEMENT N/A 10/30/2012   Procedure: REDO AORTIC VALVE REPLACEMENT (AVR);  Surgeon: Purcell Nailslarence H Owen, MD;  [facility];  Service: Open Heart Surgery;  Laterality: N/A;  . BENTALL PROCEDURE N/A 10/30/2012   Procedure: BENTALL PROCEDURE;  Surgeon: Purcell Nailslarence H Owen, MD;  [facility];  Service: Open Heart Surgery;  Laterality: N/A;  . CARDIAC CATHETERIZATION  07/31/06   10/13/12  . COLONOSCOPY    . EYE SURGERY     left  . LEFT AND RIGHT HEART CATHETERIZATION WITH CORONARY ANGIOGRAM N/A 10/13/2012   Procedure: LEFT AND RIGHT HEART CATHETERIZATION WITH CORONARY ANGIOGRAM;  Surgeon: Tonny BollmanMichael Shermeka Rutt, MD;  [facility];  Service: Cardiovascular;  Laterality: N/A;  . LEG SURGERY     left  . LUMBAR DISC SURGERY    . LUMBAR LAMINECTOMY    . MITRAL VALVE REPAIR  08/13/2006   #21mm Edwards Physio ring annuloplasty  . REPLACEMENT ASCENDING AORTA  10/30/2012   Procedure: REPLACEMENT ASCENDING AORTA;  Surgeon: Purcell Nails, MD;  Location: [facility] OR;  Service: Open Heart Surgery;;  . TONSILLECTOMY      Current Outpatient Prescriptions  Medication Sig Dispense Refill  . aspirin 81 MG tablet Take 81 mg by mouth daily.    . Cholecalciferol (VITAMIN D3) 2000 UNITS TABS Take 1 capsule by mouth daily.      . hydrochlorothiazide (HYDRODIURIL) 25 MG tablet Take 1 tablet (25 mg total) by mouth daily. 30 tablet 1  . losartan (COZAAR) 50 MG tablet Take 1 tablet (50 mg total) by mouth daily. 90 tablet 3  . metoprolol succinate (TOPROL-XL) 50 MG 24 hr tablet TAKE ONE TABLET BY MOUTH ONCE DAILY (TAKE WITH OR IMMEDIATELY FOLLOWING A MEAL 30 tablet 6   No current facility-administered medications for this visit.     Allergies:   Patient has no known allergies.   Social History:  The patient  reports that he has never smoked. He has never used smokeless tobacco. He reports that he does not drink alcohol or use drugs.   Family History:  The patient's family history includes Heart attack (age of onset: 61) in his brother; Other (age of onset: 33) in his mother.   ROS:  Please see the history of present illness.  All other systems are reviewed and negative.   PHYSICAL EXAM: VS:  BP 130/80   Pulse 70   Ht 5\' 6"  (1.676 m)   Wt 209 lb 12.8 oz (95.2 kg)   BMI 33.86 kg/m  , BMI Body mass index is 33.86 kg/m. GEN: Well nourished, well developed, in no acute distress  HEENT: normal  Neck: no JVD, no masses. No carotid bruits Cardiac: RRR with loud S2, no murmur               Respiratory:  clear to auscultation bilaterally, normal work of breathing GI: soft, nontender, nondistended, + BS MS: no deformity or atrophy  Ext: no pretibial edema, pedal pulses 2+= bilaterally Skin: warm and dry, no rash Neuro:  Strength and sensation are intact Psych: euthymic mood, full affect  EKG:  EKG is ordered today. The ekg ordered today shows NSR 70 bpm, within normal limits  Recent Labs: No results found for requested labs within last 8760 hours.   Lipid Panel     Component Value Date/Time   CHOL 97 11/20/2013 0952   TRIG 74.0 11/20/2013 0952   HDL 28.40 (L) 11/20/2013 0952   CHOLHDL 3 11/20/2013 0952   VLDL 14.8 11/20/2013 0952   LDLCALC 54 11/20/2013 0952      Wt Readings from Last 3 Encounters:   09/10/16 209 lb 12.8 oz (95.2 kg)  05/19/14 210 lb 12.8 oz (95.6 kg)  11/20/13 211 lb (95.7 kg)     Cardiac Studies Reviewed: 2D Echo 05/24/2014: Study Conclusions  - Left ventricle: The cavity size was normal. Systolic function was normal. The estimated ejection fraction was in the range of 55% to 60%. Wall motion was normal; there were no regional wall motion abnormalities. - Aortic valve: Tissue AVR with normal gradients and no perivalvular regurgitation. - Mitral valve: Post mitral valve repair with no MR and normal diatolic gradients. E/E&'  lateral elevated but not likely reflective of EDP given annuloplasty ring. - Left atrium: The atrium was mildly to moderately dilated. - Atrial septum: No defect or patent foramen ovale was identified.  CT Angio Chest 09-15-2015: IMPRESSION: 1. Interval repair of the aortic root. No evidence for acute abnormality. 2. Remote aortic valve replacement and mitral annulus repair. 3. Stable, likely reactive mediastinal lymph nodes. 4. Hiatal hernia.  ASSESSMENT AND PLAN: 1.  Valvular heart disease with history of aortic valve replacement and mitral valve repair: The patient appears stable with New York Heart Association functional class II symptoms of shortness of breath with activity. His postoperative echo studies have shown normal valve function. The patient's aortic aneurysm repair is intact based on CT scan of the chest last year. He will continue on his current medical program without changes. We'll check an echocardiogram next year on the same day as his follow-up appointment.  2. Hypertension: Blood pressure is well controlled on hydrochlorothiazide, losartan, and metoprolol. Labs are reviewed through care everywhere. He just had lab work done 08/06/2016.  3. Aortic aneurysm s/p surgical repair: intact by CT scan last year.  Current medicines are reviewed with the patient today.  The patient does not have concerns  regarding medicines.  Labs/ tests ordered today include:   Orders Placed This Encounter  Procedures  . EKG 12-Lead  . ECHOCARDIOGRAM COMPLETE    Disposition:   FU one year with an echo same day as appointment  Signed, Tonny Bollman, MD  09/12/2016 1:48 PM    [facility] [address] [place], [place], [place]  [address] Phone: [phone]; Fax: [phone]

## 2016-09-10 NOTE — Patient Instructions (Signed)

## 2016-09-26 DIAGNOSIS — M5442 Lumbago with sciatica, left side: Secondary | ICD-10-CM | POA: Diagnosis not present

## 2016-12-06 DIAGNOSIS — H52223 Regular astigmatism, bilateral: Secondary | ICD-10-CM | POA: Diagnosis not present

## 2016-12-06 DIAGNOSIS — H5212 Myopia, left eye: Secondary | ICD-10-CM | POA: Diagnosis not present

## 2016-12-06 DIAGNOSIS — H2511 Age-related nuclear cataract, right eye: Secondary | ICD-10-CM | POA: Diagnosis not present

## 2016-12-06 DIAGNOSIS — H5201 Hypermetropia, right eye: Secondary | ICD-10-CM | POA: Diagnosis not present

## 2016-12-06 DIAGNOSIS — H524 Presbyopia: Secondary | ICD-10-CM | POA: Diagnosis not present

## 2017-02-25 DIAGNOSIS — M2550 Pain in unspecified joint: Secondary | ICD-10-CM | POA: Diagnosis not present

## 2017-02-25 DIAGNOSIS — E782 Mixed hyperlipidemia: Secondary | ICD-10-CM | POA: Diagnosis not present

## 2017-02-25 DIAGNOSIS — E1165 Type 2 diabetes mellitus with hyperglycemia: Secondary | ICD-10-CM | POA: Diagnosis not present

## 2017-02-25 DIAGNOSIS — I10 Essential (primary) hypertension: Secondary | ICD-10-CM | POA: Diagnosis not present

## 2017-02-25 DIAGNOSIS — Z0000 Encounter for general adult medical examination without abnormal findings: Secondary | ICD-10-CM | POA: Diagnosis not present

## 2017-04-19 DIAGNOSIS — K219 Gastro-esophageal reflux disease without esophagitis: Secondary | ICD-10-CM | POA: Diagnosis not present

## 2017-04-19 DIAGNOSIS — R42 Dizziness and giddiness: Secondary | ICD-10-CM | POA: Diagnosis not present

## 2017-09-10 ENCOUNTER — Other Ambulatory Visit (HOSPITAL_COMMUNITY): Payer: PPO

## 2017-10-28 ENCOUNTER — Encounter: Payer: Self-pay | Admitting: Cardiovascular Disease

## 2017-10-28 ENCOUNTER — Other Ambulatory Visit: Payer: Self-pay

## 2017-10-28 ENCOUNTER — Ambulatory Visit (HOSPITAL_COMMUNITY): Payer: PPO | Attending: Internal Medicine

## 2017-10-28 ENCOUNTER — Encounter (INDEPENDENT_AMBULATORY_CARE_PROVIDER_SITE_OTHER): Payer: Self-pay

## 2017-10-28 ENCOUNTER — Ambulatory Visit: Payer: PPO | Admitting: Cardiovascular Disease

## 2017-10-28 VITALS — BP 114/76 | HR 73 | Ht 66.0 in | Wt 211.0 lb

## 2017-10-28 DIAGNOSIS — I10 Essential (primary) hypertension: Secondary | ICD-10-CM | POA: Diagnosis not present

## 2017-10-28 DIAGNOSIS — E785 Hyperlipidemia, unspecified: Secondary | ICD-10-CM | POA: Diagnosis not present

## 2017-10-28 DIAGNOSIS — I712 Thoracic aortic aneurysm, without rupture: Secondary | ICD-10-CM | POA: Insufficient documentation

## 2017-10-28 DIAGNOSIS — I38 Endocarditis, valve unspecified: Secondary | ICD-10-CM | POA: Diagnosis not present

## 2017-10-28 DIAGNOSIS — I509 Heart failure, unspecified: Secondary | ICD-10-CM | POA: Diagnosis not present

## 2017-10-28 DIAGNOSIS — I110 Hypertensive heart disease with heart failure: Secondary | ICD-10-CM | POA: Insufficient documentation

## 2017-10-28 LAB — ECHOCARDIOGRAM COMPLETE
HEIGHTINCHES: 66 in
WEIGHTICAEL: 3376 [oz_av]

## 2017-10-28 NOTE — Patient Instructions (Signed)
Medication Instructions:  Your physician recommends that you continue on your current medications as directed. Please refer to the Current Medication list given to you today.   Labwork: None  Testing/Procedures: None  Follow-Up: Your physician wants you to follow-up in: 1 year with Dr. Cooper.  You will receive a reminder letter in the mail two months in advance. If you don't receive a letter, please call our office to schedule the follow-up appointment.  If you need a refill on your cardiac medications before your next appointment, please call your pharmacy.   

## 2017-10-28 NOTE — Progress Notes (Signed)
Cardiology Office Note Date:  10/28/2017   ID:  Thomas Henderson, DOB April 07, 1940, MRN [id number]  PCP:  Irena Reichmann, DO  Cardiologist:  Tonny Bollman, MD    Chief Complaint  Patient presents with  . Follow-up    Valvular heart disease     History of Present Illness: Thomas Henderson is a 77 y.o. male who presents for follow-up of valvular heart disease and hypertension. The patient initially underwent bioprosthetic aortic valve replacement and mitral valve replacement in 2008. He underwent redo sternotomy for repair of an ascending thoracic aortic aneurysm with redo bioprosthetic aortic valve replacement in 2014. He's done well since his most recent surgery in 2014.   The patient is here alone today. Today, he denies symptoms of palpitations, chest pain, shortness of breath, orthopnea, PND, lower extremity edema, dizziness, or syncope. He's not overly active, but states 'he does whatever he wants to do.' He complains of a scratch in his throat with nonproductive cough. Complains of 'acid reflux.'  Past Medical History:  Diagnosis Date  . Aortic insufficiency    a. 2008 - AVR w/ 25mm pericardial tissue valve;  a. 12/2007 Echo No AI.  Marland Kitchen Aortic root dilatation (HCC)    a. 12/2007 Echo: Ao Root 48mm  . Arthritis    legs  . Back pain    several back surgeries  . CHF (congestive heart failure) (HCC)    a. h/o severe LV dysfxn in setting of valvular dzs in 2008;  b. 12/2007 Echo: EF 55-60%, No RWMA, No AI, Mod Ao Root Dil, Triv MR/PR/TR, NL RV.  Marland Kitchen Dyslipidemia    takes Simvastatin daily  . ED (erectile dysfunction)   . GERD (gastroesophageal reflux disease)    takes Protonix daily  . History of colon polyps   . HTN (hypertension)    takes Lisinopril and Metoprolol daily  . Intermittent lightheadedness   . Left knee DJD 11/07/2012  . Mitral regurgitation    a. 2008 - MVR w/ 26mm annuloplasty ring;  b. 12/2007 Echo Triv MR  . Pseudogout of left knee 11/07/2012  . S/P aortic valve  replacement with bioprosthetic valve 08/13/2006   #60mm Edwards Magna pericardial tissue valve  . S/P ascending aortic aneurysm repair 10/30/2012   Redo sternotomy for repair of ascending thoracic aortic aneurysm using Bentall aortic root replacement with [facility] Ease pericardial tissue valve-conduit (size 25mm valve, size 28mm graft conduit) with reimplantation of Left Main and Right Coronary Artery   . S/P mitral valve repair 08/13/2006   #41mm Edwards Physio ring annuloplasty  . Thoracic ascending aortic aneurysm (HCC) 09/07/2011  . Type I or II open fracture of left tibial plateau with malunion 11/07/2012    Past Surgical History:  Procedure Laterality Date  . AORTIC VALVE REPLACEMENT  08/13/2006   #60mm [facility] pericardial tissue valve  . AORTIC VALVE REPLACEMENT N/A 10/30/2012   Procedure: REDO AORTIC VALVE REPLACEMENT (AVR);  Surgeon: Purcell Nails, MD;  Location: [facility] OR;  Service: Open Heart Surgery;  Laterality: N/A;  . BENTALL PROCEDURE N/A 10/30/2012   Procedure: BENTALL PROCEDURE;  Surgeon: Purcell Nails, MD;  Location: [facility] OR;  Service: Open Heart Surgery;  Laterality: N/A;  . CARDIAC CATHETERIZATION  07/31/06   10/13/12  . COLONOSCOPY    . EYE SURGERY     left  . LEFT AND RIGHT HEART CATHETERIZATION WITH CORONARY ANGIOGRAM N/A 10/13/2012   Procedure: LEFT AND RIGHT HEART CATHETERIZATION WITH CORONARY ANGIOGRAM;  Surgeon: Tonny Bollman,  MD;  Location: [facility] CATH LAB;  Service: Cardiovascular;  Laterality: N/A;  . LEG SURGERY     left  . LUMBAR DISC SURGERY    . LUMBAR LAMINECTOMY    . MITRAL VALVE REPAIR  08/13/2006   #22mm Edwards Physio ring annuloplasty  . REPLACEMENT ASCENDING AORTA  10/30/2012   Procedure: REPLACEMENT ASCENDING AORTA;  Surgeon: Purcell Nails, MD;  Location: [facility] OR;  Service: Open Heart Surgery;;  . TONSILLECTOMY      Current Outpatient Medications  Medication Sig Dispense Refill  . amoxicillin (AMOXIL) 500 MG capsule TAKE 4 CAPSULES BY MOUTH 1  HOUR PRIOR TO APPOINTMENT  1  . aspirin 81 MG tablet Take 81 mg by mouth daily.    . Cholecalciferol (VITAMIN D3) 2000 UNITS TABS Take 1 capsule by mouth daily.    . hydrochlorothiazide (HYDRODIURIL) 25 MG tablet Take 1 tablet (25 mg total) by mouth daily. 30 tablet 1  . losartan (COZAAR) 50 MG tablet Take 1 tablet (50 mg total) by mouth daily. 90 tablet 3  . metoprolol succinate (TOPROL-XL) 50 MG 24 hr tablet TAKE ONE TABLET BY MOUTH ONCE DAILY (TAKE WITH OR IMMEDIATELY FOLLOWING A MEAL 30 tablet 6   No current facility-administered medications for this visit.     Allergies:   Patient has no known allergies.   Social History:  The patient  reports that he has never smoked. He has never used smokeless tobacco. He reports that he does not drink alcohol or use drugs.   Family History:  The patient's family history includes Cardiomyopathy in his unknown relative; Heart attack (age of onset: 53) in his brother; Other (age of onset: 1) in his mother.   ROS:  Please see the history of present illness.  All other systems are reviewed and negative.   PHYSICAL EXAM: VS:  BP 114/76   Pulse 73   Ht 5\' 6"  (1.676 m)   Wt 211 lb (95.7 kg)   SpO2 99%   BMI 34.06 kg/m  , BMI Body mass index is 34.06 kg/m. GEN: Well nourished, well developed, in no acute distress  HEENT: normal  Neck: no JVD, no masses. No carotid bruits Cardiac: RRR with 2/6 SEM at the RUSB, no diastolic murmur               Respiratory:  clear to auscultation bilaterally, normal work of breathing GI: soft, nontender, nondistended, + BS MS: no deformity or atrophy  Ext: no pretibial edema, pedal pulses 2+= bilaterally Skin: warm and dry, no rash Neuro:  Strength and sensation are intact Psych: euthymic mood, full affect  EKG:  EKG is ordered today. The ekg ordered today shows normal sinus rhythm 73 bpm, within normal limits  Recent Labs: No results found for requested labs within last 8760 hours.   Lipid Panel       Component Value Date/Time   CHOL 97 11/20/2013 0952   TRIG 74.0 11/20/2013 0952   HDL 28.40 (L) 11/20/2013 0952   CHOLHDL 3 11/20/2013 0952   VLDL 14.8 11/20/2013 0952   LDLCALC 54 11/20/2013 0952      Wt Readings from Last 3 Encounters:  10/28/17 211 lb (95.7 kg)  09/10/16 209 lb 12.8 oz (95.2 kg)  05/19/14 210 lb 12.8 oz (95.6 kg)    ASSESSMENT AND PLAN: 1.  Valvular heart disease involving the aortic and mitral valves with intact bioprosthetic aortic valve replacement and mitral valve repair. NYHA functional class 1-2 symptoms of chronic diastolic heart  failure noted.  Overall has minimal limitation in his activity level.  He understands to follow SBE prophylaxis.  His medications are reviewed and no changes are made.  He will have a follow-up echocardiogram today to assess his valvular disease heart murmur.  His last study was 3 years ago.  2. HTN: Blood pressure is well controlled on current medical program.  3: aortic aneurysm s/p repair: intact by last CT in 2017 which is reviewed today.   Current medicines are reviewed with the patient today.  The patient does not have concerns regarding medicines.  Labs/ tests ordered today include:   Orders Placed This Encounter  Procedures  . EKG 12-Lead    Disposition:   FU one year  Signed, Tonny BollmanMichael Dayson Aboud, MD  10/28/2017 1:32 PM    [facility] [address], [place], [place]  [address] Phone: [phone]; Fax: [phone]

## 2018-03-12 DIAGNOSIS — K219 Gastro-esophageal reflux disease without esophagitis: Secondary | ICD-10-CM | POA: Diagnosis not present

## 2018-10-21 ENCOUNTER — Telehealth: Payer: Self-pay | Admitting: Cardiovascular Disease

## 2018-10-21 NOTE — Telephone Encounter (Signed)
New Message           Patient is needing a appointment, nothing is available, pls call to advise.

## 2018-10-21 NOTE — Telephone Encounter (Signed)
Per clarification with scheduler, Pt is refusing to see APP.  Will send to covering nurse.

## 2018-10-22 NOTE — Telephone Encounter (Signed)
Scheduled patient for OV with Dr. Burt Knack tomorrow at 1540.  He understands to wear a mask. He understands the "no visitors" policy. He understands to call prior to visit if below answers change. He was grateful for assistance.       COVID-19 Pre-Screening Questions:  . In the past 7 to 10 days have you had a cough,  shortness of breath, headache, congestion, fever (100 or greater) body aches, chills, sore throat, or sudden loss of taste or sense of smell? NO . Have you been around anyone with known Covid 19? NO . Have you been around anyone who is awaiting Covid 19 test results in the past 7 to 10 days? NO . Have you been around anyone who has been exposed to Covid 19, or has mentioned symptoms of Covid 19 within the past 7 to 10 days? NO

## 2018-10-22 NOTE — Telephone Encounter (Signed)
Left message to call back to offer appointment tomorrow (7/23) afternoon.

## 2018-10-23 ENCOUNTER — Other Ambulatory Visit: Payer: Self-pay

## 2018-10-23 ENCOUNTER — Encounter: Payer: Self-pay | Admitting: Cardiovascular Disease

## 2018-10-23 ENCOUNTER — Ambulatory Visit (INDEPENDENT_AMBULATORY_CARE_PROVIDER_SITE_OTHER): Payer: Medicare HMO | Admitting: Cardiovascular Disease

## 2018-10-23 VITALS — BP 110/80 | HR 79 | Ht 66.0 in | Wt 210.0 lb

## 2018-10-23 DIAGNOSIS — I712 Thoracic aortic aneurysm, without rupture, unspecified: Secondary | ICD-10-CM

## 2018-10-23 DIAGNOSIS — I38 Endocarditis, valve unspecified: Secondary | ICD-10-CM

## 2018-10-23 DIAGNOSIS — I10 Essential (primary) hypertension: Secondary | ICD-10-CM | POA: Diagnosis not present

## 2018-10-23 MED ORDER — AMOXICILLIN 500 MG PO CAPS: ORAL_CAPSULE | ORAL | 8 refills | Status: DC

## 2018-10-23 NOTE — Patient Instructions (Signed)

## 2018-10-23 NOTE — Progress Notes (Signed)
Cardiology Office Note:    Date:  10/23/2018   ID:  Thomas Henderson, DOB [birth year], MRN [id number]  PCP:  Janie Morning, DO  Cardiologist:  Sherren Mocha, MD  Electrophysiologist:  None   Referring MD: Janie Morning, DO   Chief Complaint  Patient presents with  . Follow-up    HTN/Valvular heart disease    History of Present Illness:    Thomas Henderson is a 78 y.o. male with a hx of valvular heart disease and hypertension. The patient initially underwent bioprosthetic aortic valve replacement and mitral valve repair in 2008. He underwent redo sternotomy for repair of an ascending thoracic aortic aneurysm with redo bioprosthetic aortic valve replacement in 2014. He's done well since his most recent surgery in 2014.  The patient reports no symptoms of chest pain, chest pressure, shortness of breath, edema, orthopnea, or PND.  He does have some pain in his right calf associated with standing.  There is no real association with ambulation.  Also feels like his legs get cold.  No ulcers or wounds noted.  No orthopnea, PND, or heart palpitations.  Past Medical History:  Diagnosis Date  . Aortic insufficiency    a. 2008 - AVR w/ 49mm pericardial tissue valve;  a. 12/2007 Echo No AI.  Marland Kitchen Aortic root dilatation ([place])    a. 12/2007 Echo: Ao Root 57mm  . Arthritis    legs  . Back pain    several back surgeries  . CHF (congestive heart failure) ([place])    a. h/o severe LV dysfxn in setting of valvular dzs in 2008;  b. 12/2007 Echo: EF 55-60%, No RWMA, No AI, Mod Ao Root Dil, Triv MR/PR/TR, NL RV.  Marland Kitchen Dyslipidemia    takes Simvastatin daily  . ED (erectile dysfunction)   . GERD (gastroesophageal reflux disease)    takes Protonix daily  . History of colon polyps   . HTN (hypertension)    takes Lisinopril and Metoprolol daily  . Intermittent lightheadedness   . Left knee DJD 11/07/2012  . Mitral regurgitation    a. 2008 - MVR w/ 56mm annuloplasty ring;  b. 12/2007 Echo Triv MR  . Pseudogout of  left knee 11/07/2012  . S/P aortic valve replacement with bioprosthetic valve 08/13/2006   #59mm Edwards Magna pericardial tissue valve  . S/P ascending aortic aneurysm repair 10/30/2012   Redo sternotomy for repair of ascending thoracic aortic aneurysm using Bentall aortic root replacement with [facility] Ease pericardial tissue valve-conduit (size 41mm valve, size 79mm graft conduit) with reimplantation of Left Main and Right Coronary Artery   . S/P mitral valve repair 08/13/2006   #72mm Edwards Physio ring annuloplasty  . Thoracic ascending aortic aneurysm ([place]) 09/07/2011  . Type I or II open fracture of left tibial plateau with malunion 11/07/2012    Past Surgical History:  Procedure Laterality Date  . AORTIC VALVE REPLACEMENT  08/13/2006   #64mm [facility] pericardial tissue valve  . AORTIC VALVE REPLACEMENT N/A 10/30/2012   Procedure: REDO AORTIC VALVE REPLACEMENT (AVR);  Surgeon: Rexene Alberts, MD;  Location: [place];  Service: Open Heart Surgery;  Laterality: N/A;  . BENTALL PROCEDURE N/A 10/30/2012   Procedure: BENTALL PROCEDURE;  Surgeon: Rexene Alberts, MD;  Location: [place];  Service: Open Heart Surgery;  Laterality: N/A;  . CARDIAC CATHETERIZATION  07/31/06   10/13/12  . COLONOSCOPY    . EYE SURGERY     left  . LEFT AND RIGHT HEART CATHETERIZATION WITH CORONARY  ANGIOGRAM N/A 10/13/2012   Procedure: LEFT AND RIGHT HEART CATHETERIZATION WITH CORONARY ANGIOGRAM;  Surgeon: Tonny BollmanMichael Patte Winkel, MD;  [facility];  Service: Cardiovascular;  Laterality: N/A;  . LEG SURGERY     left  . LUMBAR DISC SURGERY    . LUMBAR LAMINECTOMY    . MITRAL VALVE REPAIR  08/13/2006   #7126mm Edwards Physio ring annuloplasty  . REPLACEMENT ASCENDING AORTA  10/30/2012   Procedure: REPLACEMENT ASCENDING AORTA;  Surgeon: Purcell Nailslarence H Owen, MD;  Location: [facility] OR;  Service: Open Heart Surgery;;  . TONSILLECTOMY      Current Medications: Current Meds  Medication Sig  . amoxicillin (AMOXIL) 500 MG capsule  TAKE 4 CAPSULES BY MOUTH 1 HOUR PRIOR TO APPOINTMENT  . aspirin 81 MG tablet Take 81 mg by mouth daily.  . Cholecalciferol (VITAMIN D3) 2000 UNITS TABS Take 1 capsule by mouth daily.  . hydrochlorothiazide (HYDRODIURIL) 25 MG tablet Take 1 tablet (25 mg total) by mouth daily.  Marland Kitchen. losartan (COZAAR) 50 MG tablet Take 1 tablet (50 mg total) by mouth daily.  . metoprolol succinate (TOPROL-XL) 50 MG 24 hr tablet TAKE ONE TABLET BY MOUTH ONCE DAILY (TAKE WITH OR IMMEDIATELY FOLLOWING A MEAL     Allergies:   Patient has no known allergies.   Social History   Socioeconomic History  . Marital status: Married    Spouse name: Not on file  . Number of children: 3  . Years of education: Not on file  . Highest education level: Not on file  Occupational History  . Occupation: retired Research scientist (physical sciences)mechanic  Social Needs  . Financial resource strain: Not on file  . Food insecurity    Worry: Not on file    Inability: Not on file  . Transportation needs    Medical: Not on file    Non-medical: Not on file  Tobacco Use  . Smoking status: Never Smoker  . Smokeless tobacco: Never Used  Substance and Sexual Activity  . Alcohol use: No  . Drug use: No  . Sexual activity: Yes  Lifestyle  . Physical activity    Days per week: Not on file    Minutes per session: Not on file  . Stress: Not on file  Relationships  . Social Musicianconnections    Talks on phone: Not on file    Gets together: Not on file    Attends religious service: Not on file    Active member of club or organization: Not on file    Attends meetings of clubs or organizations: Not on file    Relationship status: Not on file  Other Topics Concern  . Not on file  Social History Narrative  . Not on file     Family History: The patient's family history includes Cardiomyopathy in his unknown relative; Heart attack (age of onset: 1265) in his brother; Other (age of onset: 3896) in his mother.  ROS:   Please see the history of present illness.    All  other systems reviewed and are negative.  EKGs/Labs/Other Studies Reviewed:    The following studies were reviewed today: Echo 10/28/2017: Study Conclusions  - Left ventricle: The cavity size was normal. Wall thickness was   increased in a pattern of mild LVH. Systolic function was normal.   The estimated ejection fraction was in the range of 55% to 60%.   Wall motion was normal; there were no regional wall motion   abnormalities. Doppler parameters are consistent with high  ventricular filling pressure. - Aortic valve: A bioprosthesis was present. - Mitral valve: Calcified annulus. Prior procedures included   surgical repair.  Impressions:  - Normal LV systolic function; elevated LV filling pressure; mild   LVH: s/p AVR with normal mean gradient of 8 mmHg and no AI; s/p   MV repair with mean gradient of 3 mmHg and no MR.  Left ventricle:  The cavity size was normal. Wall thickness was increased in a pattern of mild LVH. Systolic function was normal. The estimated ejection fraction was in the range of 55% to 60%. Wall motion was normal; there were no regional wall motion abnormalities. Doppler parameters are consistent with high ventricular filling pressure.  ------------------------------------------------------------------- Aortic valve:   Normal thickness leaflets. A bioprosthesis was present. Mobility was not restricted.  Doppler:  Transvalvular velocity was within the normal range. There was no stenosis. There was no regurgitation.    VTI ratio of LVOT to aortic valve: 0.54. Indexed valve area (VTI): 0.87 cm^2/m^2. Peak velocity ratio of LVOT to aortic valve: 0.52. Indexed valve area (Vmax): 0.84 cm^2/m^2. Mean velocity ratio of LVOT to aortic valve: 0.51. Indexed valve area (Vmean): 0.82 cm^2/m^2.    Mean gradient (S): 8 mm Hg. Peak gradient (S): 15 mm Hg.  ------------------------------------------------------------------- Aorta:  Aortic root: The aortic root  was normal in size.  ------------------------------------------------------------------- Mitral valve:   Calcified annulus. Prior procedures included surgical repair. Mobility was not restricted.  Doppler: Transvalvular velocity was within the normal range. There was no evidence for stenosis. There was no regurgitation.    Indexed valve area by continuity equation (using LVOT flow): 0.88 cm^2/m^2. Mean gradient (D): 3 mm Hg. Peak gradient (D): 7 mm Hg.  ------------------------------------------------------------------- Left atrium:  The atrium was normal in size.  ------------------------------------------------------------------- Right ventricle:  The cavity size was normal. Systolic function was normal.  ------------------------------------------------------------------- Pulmonic valve:    Doppler:  Transvalvular velocity was within the normal range. There was no evidence for stenosis.  ------------------------------------------------------------------- Tricuspid valve:   Structurally normal valve.    Doppler: Transvalvular velocity was within the normal range. There was trivial regurgitation.  ------------------------------------------------------------------- Right atrium:  The atrium was normal in size.  ------------------------------------------------------------------- Pericardium:  There was no pericardial effusion.  EKG:  EKG is ordered today.  The ekg ordered today demonstrates normal sinus rhythm 79 bpm, minimal voltage criteria for LVH may be normal variant.  Recent Labs: No results found for requested labs within last 8760 hours.  Recent Lipid Panel    Component Value Date/Time   CHOL 97 11/20/2013 0952   TRIG 74.0 11/20/2013 0952   HDL 28.40 (L) 11/20/2013 0952   CHOLHDL 3 11/20/2013 0952   VLDL 14.8 11/20/2013 0952   LDLCALC 54 11/20/2013 0952    Physical Exam:    VS:  BP 110/80   Pulse 79   Ht 5\' 6"  (1.676 m)   Wt 210 lb (95.3 kg)   SpO2  98%   BMI 33.89 kg/m     Wt Readings from Last 3 Encounters:  10/23/18 210 lb (95.3 kg)  10/28/17 211 lb (95.7 kg)  09/10/16 209 lb 12.8 oz (95.2 kg)     GEN:  Well nourished, well developed in no acute distress HEENT: Normal NECK: No JVD; No carotid bruits LYMPHATICS: No lymphadenopathy CARDIAC: RRR, no murmurs, rubs, gallops RESPIRATORY:  Clear to auscultation without rales, wheezing or rhonchi  ABDOMEN: Soft, non-tender, non-distended MUSCULOSKELETAL:  No edema; No deformity.  PT pulses are 2+ and equal bilaterally  SKIN: Warm and dry NEUROLOGIC:  Alert and oriented x 3 PSYCHIATRIC:  Normal affect   ASSESSMENT:    1. Essential hypertension   2. Valvular heart disease   3. Thoracic aortic aneurysm without rupture (HCC)    PLAN:    In order of problems listed above:  1. The patient appears to be doing well on a combination of hydrochlorothiazide, losartan, and metoprolol succinate.  No changes are made today.  He does have occasional episodes of low blood pressure associated with a dizzy feeling.  He is advised to drink plenty of fluids.  Overall his blood pressure control is excellent and no changes are recommended today. 2. The patient has a history of bioprosthetic aortic valve replacement and mitral valve repair.  His valves are functioning normally by exam.  His echocardiogram from 1 year ago is reviewed and showed normal function of his mitral valve repair and aortic valve replacement.  He follows SBE prophylaxis per guidelines and his amoxicillin is refilled today. 3. Patient status post surgical repair of his thoracic aortic aneurysm.   Medication Adjustments/Labs and Tests Ordered: Current medicines are reviewed at length with the patient today.  Concerns regarding medicines are outlined above.  Orders Placed This Encounter  Procedures  . EKG 12-Lead   No orders of the defined types were placed in this encounter.   Patient Instructions  Medication Instructions:   Your provider recommends that you continue on your current medications as directed. Please refer to the Current Medication list given to you today.    Labwork: None  Testing/Procedures: None  Follow-Up: Your provider wants you to follow-up in: 1 year with Dr. Excell Seltzerooper. You will receive a reminder letter in the mail two months in advance. If you don't receive a letter, please call our office to schedule the follow-up appointment.    Any Other Special Instructions Will Be Listed Below (If Applicable).     If you need a refill on your cardiac medications before your next appointment, please call your pharmacy.      Signed, Tonny BollmanMichael Skiler Tye, MD  10/23/2018 4:28 PM    [facility]

## 2018-10-30 ENCOUNTER — Ambulatory Visit: Payer: PPO | Admitting: Cardiology

## 2019-10-21 ENCOUNTER — Encounter: Payer: Self-pay | Admitting: *Deleted

## 2019-10-27 ENCOUNTER — Ambulatory Visit: Payer: Medicare HMO | Admitting: Physician Assistant

## 2019-11-02 NOTE — Progress Notes (Signed)
Cardiology Office Note:    Date:  11/03/2019   ID:  HADDEN STEIG, DOB 1940-06-21, MRN [id number]  PCP:  Irena Reichmann, DO  Cardiologist:  Tonny Bollman, MD  Electrophysiologist:  None   Referring MD: Irena Reichmann, DO   Chief Complaint:  Follow-up (Valvular Heart Disease)    Patient Profile:    Thomas Henderson is a 79 y.o. male with:   Valvular heart disease  S/p bioprosthetic AVR and MV repair in 2008  S/p redo bioprosthetic AVR in 2014  Ascending Thoracic Aortic Aneurysm   S/p repair of TAA in 2014   Coronary artery disease   Ectatic coronary arteries with diffuse non-obstructive CAD  Hypertension   Hyperlipidemia   GERD   Prior CV studies: Echocardiogram 10/28/17 Mild LVH, EF 55-60, no RWMA, s/p AVR with normal mean gradient (8 mmHg), no AI, s/p MV repair w mean gradient 3 mmHg  Carotid US 10/28/12 Bilat ICA 0-39  Cardiac catheterization 10/13/12 LAD diffuse ectasia, irreg suggestive of non-obstructive plaque LCx diffuse irregularity RCA dilated    History of Present Illness:    Thomas Henderson was last seen by Dr. [name] in 10/2018.  He returns for follow up.   He is here alone.  He continues to do well without chest discomfort or significant shortness of breath.  He has not had orthopnea, significant leg swelling or syncope.  He continues to have spells of dizziness/near syncope.  These occur maybe every 1 to 2 weeks.  He has had these since his last surgery.  He feels that he is hydrating well enough.  These can occur while seated or standing.  Past Medical History:  Diagnosis Date   Aortic insufficiency    a. 2008 - AVR w/ 104mm pericardial tissue valve;  a. 12/2007 Echo No AI.   Aortic root dilatation (HCC)    a. 12/2007 Echo: Ao Root 11mm   Arthritis    legs   Back pain    several back surgeries   CHF (congestive heart failure) (HCC)    a. h/o severe LV dysfxn in setting of valvular dzs in 2008;  b. 12/2007 Echo: EF 55-60%, No RWMA, No AI, Mod Ao Root  Dil, Triv MR/PR/TR, NL RV.   Dyslipidemia    takes Simvastatin daily   ED (erectile dysfunction)    GERD (gastroesophageal reflux disease)    takes Protonix daily   History of colon polyps    HTN (hypertension)    takes Lisinopril and Metoprolol daily   Intermittent lightheadedness    Left knee DJD 11/07/2012   Mitral regurgitation    a. 2008 - MVR w/ 90mm annuloplasty ring;  b. 12/2007 Echo Triv MR   Pseudogout of left knee 11/07/2012   S/P aortic valve replacement with bioprosthetic valve 08/13/2006   #28mm Edwards Magna pericardial tissue valve   S/P ascending aortic aneurysm repair 10/30/2012   Redo sternotomy for repair of ascending thoracic aortic aneurysm using Bentall aortic root replacement with [facility] Ease pericardial tissue valve-conduit (size 74mm valve, size 47mm graft conduit) with reimplantation of Left Main and Right Coronary Artery    S/P mitral valve repair 08/13/2006   #73mm Edwards Physio ring annuloplasty   Thoracic ascending aortic aneurysm (HCC) 09/07/2011   Type I or II open fracture of left tibial plateau with malunion 11/07/2012    Current Medications: Current Meds  Medication Sig   amoxicillin (AMOXIL) 500 MG capsule Take 4 pills (2,000 mg) one hour prior to all dental visits.  aspirin 81 MG tablet Take 81 mg by mouth daily.   Cholecalciferol (VITAMIN D3) 2000 UNITS TABS Take 1 capsule by mouth daily.   hydrochlorothiazide (HYDRODIURIL) 25 MG tablet Take 1 tablet (25 mg total) by mouth daily.   losartan (COZAAR) 50 MG tablet Take 1 tablet (50 mg total) by mouth daily.   metoprolol succinate (TOPROL-XL) 50 MG 24 hr tablet TAKE ONE TABLET BY MOUTH ONCE DAILY (TAKE WITH OR IMMEDIATELY FOLLOWING A MEAL     Allergies:   Patient has no known allergies.   Social History   Tobacco Use   Smoking status: Never Smoker   Smokeless tobacco: Never Used  Vaping Use   Vaping Use: Never used  Substance Use Topics   Alcohol use: No   Drug  use: No     Family Hx: The patient's family history includes Cardiomyopathy in an other family member; Heart attack (age of onset: 50) in his brother; Other (age of onset: 42) in his mother.  ROS   EKGs/Labs/Other Test Reviewed:    EKG:  EKG is  ordered today.  The ekg ordered today demonstrates normal sinus rhythm, heart rate 81, normal axis, no ST-T wave changes, QTC 439, similar to prior tracing  Recent Labs:  Labs from PCP personally reviewed/interpreted 04/30/2019: K 4.0, creatinine 1.18, albumin 4.1, ALT 22, Hgb A1c 6.0 04/22/2019: LDL 104, TC 152, HDL 31, triglycerides 86  Physical Exam:    VS:  BP 120/70    Pulse 81    Ht 5\' 6"  (1.676 m)    Wt 208 lb (94.3 kg)    SpO2 96%    BMI 33.57 kg/m     Wt Readings from Last 3 Encounters:  11/03/19 208 lb (94.3 kg)  10/23/18 210 lb (95.3 kg)  10/28/17 211 lb (95.7 kg)     Constitutional:      Appearance: Healthy appearance. Not in distress.  Neck:     Vascular: JVD normal.  Pulmonary:     Effort: Pulmonary effort is normal.     Breath sounds: No wheezing. No rales.  Cardiovascular:     Normal rate. Regular rhythm. Normal S1. Normal S2.     Murmurs: There is no murmur.  Edema:    Peripheral edema absent.  Abdominal:     Palpations: Abdomen is soft.  Skin:    General: Skin is warm and dry.  Neurological:     General: No focal deficit present.     Mental Status: Alert and oriented to person, place and time.     Cranial Nerves: Cranial nerves are intact.      ASSESSMENT & PLAN:    1. Valvular heart disease 2. Thoracic aortic aneurysm without rupture (HCC) History of bioprosthetic aortic valve replacement and mitral valve repair in 2008.  He subsequently underwent repair of thoracic aortic aneurysm and redo aortic valve replacement in 2014.  His most recent echocardiogram in 2019 demonstrated normal aortic valve and mitral valve function.  He is currently doing well without symptoms to suggest recurrent valvular  disease.  His exam suggests well-functioning valve prosthesis.  Continue SBE prophylaxis.  Follow-up in 1 year.  3. Essential hypertension The patient's blood pressure is controlled on his current regimen.  Continue current therapy.   4. Near syncope He continues to have episodes of dizziness/near syncope.  These have been occurring since his surgery.  He feels that he is hydrating well enough.  He has had episodes while seated in the wintertime.  I have  suggested that we proceed with a 14-day ZIO monitor to rule out tachycardia or bradycardia arrhythmias.  -14 day Zio (Live AT) Monitor    Dispo:  Return in about 1 year (around 11/02/2020) for Routine Follow Up, w/ Dr. [name], or Thomas Newcomer, PA-C, in person.   Medication Adjustments/Labs and Tests Ordered: Current medicines are reviewed at length with the patient today.  Concerns regarding medicines are outlined above.  Tests Ordered: Orders Placed This Encounter  Procedures   LONG TERM MONITOR-LIVE TELEMETRY (3-14 DAYS)   EKG 12-Lead   Medication Changes: No orders of the defined types were placed in this encounter.   Signed, Thomas Newcomer, PA-C  11/03/2019 8:37 AM    [facility] [address] [place], [place], [place]  [address] Phone: [phone]; Fax: [phone]

## 2019-11-03 ENCOUNTER — Other Ambulatory Visit: Payer: Self-pay

## 2019-11-03 ENCOUNTER — Ambulatory Visit: Payer: Medicare HMO | Admitting: Physician Assistant

## 2019-11-03 ENCOUNTER — Encounter: Payer: Self-pay | Admitting: *Deleted

## 2019-11-03 ENCOUNTER — Encounter: Payer: Self-pay | Admitting: Physician Assistant

## 2019-11-03 VITALS — BP 120/70 | HR 81 | Ht 66.0 in | Wt 208.0 lb

## 2019-11-03 DIAGNOSIS — R55 Syncope and collapse: Secondary | ICD-10-CM | POA: Diagnosis not present

## 2019-11-03 DIAGNOSIS — I712 Thoracic aortic aneurysm, without rupture, unspecified: Secondary | ICD-10-CM

## 2019-11-03 DIAGNOSIS — I10 Essential (primary) hypertension: Secondary | ICD-10-CM | POA: Diagnosis not present

## 2019-11-03 DIAGNOSIS — I38 Endocarditis, valve unspecified: Secondary | ICD-10-CM | POA: Diagnosis not present

## 2019-11-03 DIAGNOSIS — E782 Mixed hyperlipidemia: Secondary | ICD-10-CM

## 2019-11-03 NOTE — Patient Instructions (Signed)
Medication Instructions:  Your physician recommends that you continue on your current medications as directed. Please refer to the Current Medication list given to you today.  *If you need a refill on your cardiac medications before your next appointment, please call your pharmacy*  Lab Work: None ordered today  Testing/Procedures: A zio monitor was ordered today. It will remain on for 14 days. You will then return monitor and event diary in provided box. It takes 1-2 weeks for report to be downloaded and returned to [place]. We will call you with the results. If monitor falls off or has orange flashing light, please call Zio for further instructions.   Follow-Up: At [facility], you and your health needs are our priority.  As part of our continuing mission to provide you with exceptional heart care, we have created designated Provider Care Teams.  These Care Teams include your primary Cardiologist (physician) and Advanced Practice Providers (APPs -  Physician Assistants and Nurse Practitioners) who all work together to provide you with the care you need, when you need it.  We recommend signing up for the patient portal called "MyChart".  Sign up information is provided on this After Visit Summary.  MyChart is used to connect with patients for Virtual Visits (Telemedicine).  Patients are able to view lab/test results, encounter notes, upcoming appointments, etc.  Non-urgent messages can be sent to your provider as well.   To learn more about what you can do with MyChart, go to [email or web address].    Your next appointment:   12 month(s)  The format for your next appointment:   In Person  Provider:   You may see Tonny Bollman, MD or Tereso Newcomer, PA-C

## 2019-11-03 NOTE — Progress Notes (Signed)
Patient ID: Thomas Henderson, male   DOB: [birth year], 79 y.o.   MRN: [id number] Patient enrolled for [employer] to ship a 14 day ZIO AT long term monitor-Live Telemetry to his home. Letter with instructions mailed to patient.

## 2019-11-10 ENCOUNTER — Encounter (INDEPENDENT_AMBULATORY_CARE_PROVIDER_SITE_OTHER): Payer: Medicare HMO

## 2019-11-10 DIAGNOSIS — R55 Syncope and collapse: Secondary | ICD-10-CM | POA: Diagnosis not present

## 2019-12-28 ENCOUNTER — Encounter: Payer: Self-pay | Admitting: Physician Assistant

## 2020-10-04 ENCOUNTER — Telehealth: Payer: Self-pay | Admitting: Cardiovascular Disease

## 2020-10-04 MED ORDER — APIXABAN 5 MG PO TABS: 5.0000 mg | ORAL_TABLET | Freq: Two times a day (BID) | ORAL | 11 refills | Status: DC

## 2020-10-04 MED ORDER — METOPROLOL TARTRATE 25 MG PO TABS: 25.0000 mg | ORAL_TABLET | Freq: Two times a day (BID) | ORAL | 11 refills | Status: DC

## 2020-10-04 NOTE — Telephone Encounter (Signed)
    Thomas Henderson Speak with [facility] called, she said they saw pt there and had an EKG last Friday and it shows pt is in Afib, pcp is requesting for pt to be seen sooner with Dr. [name] or APP. She gave office # [phone]

## 2020-10-04 NOTE — Telephone Encounter (Signed)
Returned call to [place] at [employer] at [place] regarding patient found to be in atrial fib today during an office visit with Francee Piccolo, NP. He presented for leg pain and the NP thought his heart sounded irregular; was found to be in a fib confirmed by ekg. I have asked that the ekg be faxed to [place] along with the office visit note. Veryl Speak states that the provider has left the office for the remainder of today but did not start the patient on any new medications today. I will review with Dr. Eldridge Dace, DOD once the ekg is received. I thanked [place] for her assistance.

## 2020-10-04 NOTE — Telephone Encounter (Signed)
EKG and office visit notes were received and reviewed with DOD, Dr. Eldridge Dace. Per Dr. Eldridge Dace, will start Eliquis 5 mg BID, metoprolol 25 mg BID and plan for office visit in 1-2 weeks. Parameters for starting Eliquis 5 mg were reviewed and verified by Margaretmary Dys, PharmD.  I called and spoke with patient and reviewed the plan of care. He is agreeable to stop aspirin 81 mg and start Eliquis 5 mg BID. He is aware to call back if he needs samples or needs assistance paying for the medication. Advised him to stop Toprol XL and start Lopressor 25 mg BID. I scheduled him to see Dr. [name] on 7/25 (ok'ed by Dr. [name] to add on). I advised him to call back prior to appointment with questions or concerns. He thanked me for the call.  Notes placed in Dr. Earmon Phoenix box for review.

## 2020-10-23 NOTE — Progress Notes (Signed)
Cardiology Office Note:    Date:  10/24/2020   ID:  Thomas MccoyWillie D Fullman, DOB 04/24/1940, MRN [id number]  PCP:  Hadley Penobbins, Robert A, MD   [facility] Providers Cardiologist:  Tonny BollmanMichael Doxie Augenstein, MD     Referring MD: Irena Reichmannollins, Dana, DO   Chief Complaint  Patient presents with   Atrial Fibrillation    History of Present Illness:    Thomas Henderson is a 80 y.o. male with a hx of: Valvular heart disease S/p bioprosthetic AVR and MV repair in 2008 S/p redo bioprosthetic AVR in 2014 Ascending Thoracic Aortic Aneurysm S/p repair of TAA in 2014 Coronary artery disease Ectatic coronary arteries with diffuse non-obstructive CAD Hypertension Hyperlipidemia GERD   The patient is here alone today.  He was recently seen by his primary care physician for evaluation of knee pain.  When he had his physical examination, he was noted to have an irregular heart rate.  An EKG was done and this demonstrates atrial fibrillation with rapid ventricular rate of 113 bpm and frequent PVCs.  I have a copy of that tracing for review today.  He was started on apixaban for anticoagulation and metoprolol for rate control.  However, within a few days he began to have diffuse muscle aches and he stopped both of these medications.  His symptoms have resolved.  He has had no heart palpitations, lightheadedness, chest pain, or shortness of breath.  He denies leg swelling, orthopnea, PND, or syncope.  Past Medical History:  Diagnosis Date   Aortic insufficiency    a. 2008 - AVR w/ 25mm pericardial tissue valve;  a. 12/2007 Echo No AI.   Aortic root dilatation (HCC)    a. 12/2007 Echo: Ao Root 48mm   Arthritis    legs   Back pain    several back surgeries   CHF (congestive heart failure) (HCC)    a. h/o severe LV dysfxn in setting of valvular dzs in 2008;  b. 12/2007 Echo: EF 55-60%, No RWMA, No AI, Mod Ao Root Dil, Triv MR/PR/TR, NL RV.   Dyslipidemia    takes Simvastatin daily   ED (erectile dysfunction)    GERD  (gastroesophageal reflux disease)    takes Protonix daily   History of colon polyps    HTN (hypertension)    takes Lisinopril and Metoprolol daily   Intermittent lightheadedness    Left knee DJD 11/07/2012   Mitral regurgitation    a. 2008 - MVR w/ 26mm annuloplasty ring;  b. 12/2007 Echo Triv MR   Near syncope    Monitor 9/21: NSR, average HR 83, no atrial fibrillation, high-grade heart block or pathologic pauses; rare PVCs with few short 3-4 beat ventricular runs and rare supraventricular runs (no longer than 5 beats)   Pseudogout of left knee 11/07/2012   S/P aortic valve replacement with bioprosthetic valve 08/13/2006   #2025mm Edwards Magna pericardial tissue valve   S/P ascending aortic aneurysm repair 10/30/2012   Redo sternotomy for repair of ascending thoracic aortic aneurysm using Bentall aortic root replacement with [facility] Magn[facility] Ease pericardial tissue valve-conduit (size 25mm valve, size 28mm graft conduit) with reimplantation of Left Main and Right Coronary Artery    S/P mitral valve repair 08/13/2006   #6726mm Edwards Physio ring annuloplasty   Thoracic ascending aortic aneurysm (HCC) 09/07/2011   Type I or II open fracture of left tibial plateau with malunion 11/07/2012    Past Surgical History:  Procedure Laterality Date   AORTIC VALVE REPLACEMENT  08/13/2006   #  83mm Edwards Magna pericardial tissue valve   AORTIC VALVE REPLACEMENT N/A 10/30/2012   Procedure: REDO AORTIC VALVE REPLACEMENT (AVR);  Surgeon: Purcell Nails, MD;  Location: [facility] OR;  Service: Open Heart Surgery;  Laterality: N/A;   BENTALL PROCEDURE N/A 10/30/2012   Procedure: BENTALL PROCEDURE;  Surgeon: Purcell Nails, MD;  [facility] OR;  Service: Open Heart Surgery;  Laterality: N/A;   CARDIAC CATHETERIZATION  07/31/06   10/13/12   COLONOSCOPY     EYE SURGERY     left   LEFT AND RIGHT HEART CATHETERIZATION WITH CORONARY ANGIOGRAM N/A 10/13/2012   Procedure: LEFT AND RIGHT HEART CATHETERIZATION WITH CORONARY ANGIOGRAM;   Surgeon: Tonny Bollman, MD;  Location: [facility] CATH LAB;  Service: Cardiovascular;  Laterality: N/A;   LEG SURGERY     left   LUMBAR DISC SURGERY     LUMBAR LAMINECTOMY     MITRAL VALVE REPAIR  08/13/2006   #75mm Edwards Physio ring annuloplasty   REPLACEMENT ASCENDING AORTA  10/30/2012   Procedure: REPLACEMENT ASCENDING AORTA;  Surgeon: Purcell Nails, MD;  Location: [facility] OR;  Service: Open Heart Surgery;;   TONSILLECTOMY      Current Medications: Current Meds  Medication Sig   amoxicillin (AMOXIL) 500 MG capsule Take 4 pills (2,000 mg) one hour prior to all dental visits.   calcium carbonate (OS-CAL) 1250 (500 Ca) MG chewable tablet Chew by mouth.   Cholecalciferol (VITAMIN D3) 2000 UNITS TABS Take 1 capsule by mouth daily.   hydrochlorothiazide (HYDRODIURIL) 25 MG tablet Take 1 tablet (25 mg total) by mouth daily.   ketoconazole (NIZORAL) 2 % cream Apply to areas on feet daily   losartan (COZAAR) 50 MG tablet Take 1 tablet (50 mg total) by mouth daily.   sodium fluoride (FLUORISHIELD) 1.1 % GEL dental gel 2 (two) times daily.     Allergies:   Patient has no known allergies.   Social History   Socioeconomic History   Marital status: Married    Spouse name: Not on file   Number of children: 3   Years of education: Not on file   Highest education level: Not on file  Occupational History   Occupation: retired Curator  Tobacco Use   Smoking status: Never   Smokeless tobacco: Never  Vaping Use   Vaping Use: Never used  Substance and Sexual Activity   Alcohol use: No   Drug use: No   Sexual activity: Yes  Other Topics Concern   Not on file  Social History Narrative   Not on file   Social Determinants of Health   Financial Resource Strain: Not on file  Food Insecurity: Not on file  Transportation Needs: Not on file  Physical Activity: Not on file  Stress: Not on file  Social Connections: Not on file     Family History: The patient's family history includes  Cardiomyopathy in an other family member; Heart attack (age of onset: 22) in his brother; Other (age of onset: 10) in his mother.  ROS:   Please see the history of present illness.    All other systems reviewed and are negative.  EKGs/Labs/Other Studies Reviewed:    The following studies were reviewed today: Outside EKG is reviewed and demonstrates atrial fibrillation with a ventricular rate of 113 bpm, frequent PVCs.  A CT scan of the chest dated 10/07/2020 was negative for pulmonary embolus or acute disease.  EKG:  EKG is ordered today.  The ekg ordered today demonstrates normal sinus  rhythm 76 bpm, within normal limits.  Recent Labs: No results found for requested labs within last 8760 hours.  Recent Lipid Panel    Component Value Date/Time   CHOL 97 11/20/2013 0952   TRIG 74.0 11/20/2013 0952   HDL 28.40 (L) 11/20/2013 0952   CHOLHDL 3 11/20/2013 0952   VLDL 14.8 11/20/2013 0952   LDLCALC 54 11/20/2013 0952     Risk Assessment/Calculations:    CHA2DS2-VASc Score = 3  This indicates a 3.2% annual risk of stroke. The patient's score is based upon: CHF History: No HTN History: Yes Diabetes History: No Stroke History: No Vascular Disease History: No Age Score: 2 Gender Score: 0         Physical Exam:    VS:  BP 120/80   Pulse 76   Ht 5\' 6"  (1.676 m)   Wt 208 lb (94.3 kg)   SpO2 98%   BMI 33.57 kg/m     Wt Readings from Last 3 Encounters:  10/24/20 208 lb (94.3 kg)  11/03/19 208 lb (94.3 kg)  10/23/18 210 lb (95.3 kg)     GEN:  Well nourished, well developed in no acute distress HEENT: Normal NECK: No JVD; No carotid bruits LYMPHATICS: No lymphadenopathy CARDIAC: RRR, no murmurs, rubs, gallops RESPIRATORY:  Clear to auscultation without rales, wheezing or rhonchi  ABDOMEN: Soft, non-tender, non-distended MUSCULOSKELETAL:  No edema; No deformity  SKIN: Warm and dry NEUROLOGIC:  Alert and oriented x 3 PSYCHIATRIC:  Normal affect   ASSESSMENT:     1. Paroxysmal atrial fibrillation (HCC)   2. Essential hypertension   3. Valvular heart disease   4. Thoracic aortic aneurysm without rupture (HCC)    PLAN:    In order of problems listed above:  The patient has newly diagnosed atrial fibrillation.  Fortunately he is back in sinus rhythm today.  We discussed the importance of oral anticoagulation for stroke prevention.  He is willing to try to take apixaban again.  He will start back on 5 mg twice daily.  I will not start his metoprolol back since both of these medicines were initiated at the same time and he had myalgias.  I think myalgias are a rare complaint associated with either apixaban or metoprolol, but if we reinitiate them 1 at a time we should be able to sort this out.  If he is intolerant to apixaban, would probably switch him to rivaroxaban.  He will otherwise continue with observation as he seemed to be completely asymptomatic with respect to his atrial fibrillation.  We will check a 2D echocardiogram since it has been several years since his last study. Blood pressure controlled on hydrochlorothiazide and losartan. Check 2D echo.  Exam suggests normal function of his aortic and mitral valves. Normal aortic dimensions on recent CT scan.        Medication Adjustments/Labs and Tests Ordered: Current medicines are reviewed at length with the patient today.  Concerns regarding medicines are outlined above.  Orders Placed This Encounter  Procedures   EKG 12-Lead   ECHOCARDIOGRAM COMPLETE   No orders of the defined types were placed in this encounter.   Patient Instructions  Medication Instructions:  1) RESTART Eliquis 5mg  twice daily  *If you need a refill on your cardiac medications before your next appointment, please call your pharmacy*   Lab Work: None If you have labs (blood work) drawn today and your tests are completely normal, you will receive your results only by: MyChart Message (if you  have MyChart) OR A  paper copy in the mail If you have any lab test that is abnormal or we need to change your treatment, we will call you to review the results.   Testing/Procedures: Your physician has requested that you have an echocardiogram. Echocardiography is a painless test that uses sound waves to create images of your heart. It provides your doctor with information about the size and shape of your heart and how well your heart's chambers and valves are working. This procedure takes approximately one hour. There are no restrictions for this procedure.   Follow-Up: At [facility], you and your health needs are our priority.  As part of our continuing mission to provide you with exceptional heart care, we have created designated Provider Care Teams.  These Care Teams include your primary Cardiologist (physician) and Advanced Practice Providers (APPs -  Physician Assistants and Nurse Practitioners) who all work together to provide you with the care you need, when you need it.  We recommend signing up for the patient portal called "MyChart".  Sign up information is provided on this After Visit Summary.  MyChart is used to connect with patients for Virtual Visits (Telemedicine).  Patients are able to view lab/test results, encounter notes, upcoming appointments, etc.  Non-urgent messages can be sent to your provider as well.   To learn more about what you can do with MyChart, go to [email or web address].    Your next appointment:   3 month(s)  The format for your next appointment:   In Person  Provider:   You will see one of the following Advanced Practice Providers on your designated Care Team:   Tereso Newcomer, PA-C Vin Campus, PA-C Georgie Chard, NP Nada Boozer, NP Jacolyn Reedy, PA-C   Other Instructions     Signed, Tonny Bollman, MD  10/24/2020 5:38 PM    [facility]

## 2020-10-24 ENCOUNTER — Other Ambulatory Visit: Payer: Self-pay

## 2020-10-24 ENCOUNTER — Ambulatory Visit: Payer: Medicare HMO | Admitting: Cardiovascular Disease

## 2020-10-24 ENCOUNTER — Encounter: Payer: Self-pay | Admitting: Cardiovascular Disease

## 2020-10-24 VITALS — BP 120/80 | HR 76 | Ht 66.0 in | Wt 208.0 lb

## 2020-10-24 DIAGNOSIS — I10 Essential (primary) hypertension: Secondary | ICD-10-CM | POA: Diagnosis not present

## 2020-10-24 DIAGNOSIS — I712 Thoracic aortic aneurysm, without rupture, unspecified: Secondary | ICD-10-CM

## 2020-10-24 DIAGNOSIS — I38 Endocarditis, valve unspecified: Secondary | ICD-10-CM

## 2020-10-24 DIAGNOSIS — I480 Paroxysmal atrial fibrillation: Secondary | ICD-10-CM

## 2020-10-24 NOTE — Patient Instructions (Signed)
Medication Instructions:  1) RESTART Eliquis 5mg  twice daily  *If you need a refill on your cardiac medications before your next appointment, please call your pharmacy*   Lab Work: None If you have labs (blood work) drawn today and your tests are completely normal, you will receive your results only by: MyChart Message (if you have MyChart) OR A paper copy in the mail If you have any lab test that is abnormal or we need to change your treatment, we will call you to review the results.   Testing/Procedures: Your physician has requested that you have an echocardiogram. Echocardiography is a painless test that uses sound waves to create images of your heart. It provides your doctor with information about the size and shape of your heart and how well your heart's chambers and valves are working. This procedure takes approximately one hour. There are no restrictions for this procedure.   Follow-Up: At [facility], you and your health needs are our priority.  As part of our continuing mission to provide you with exceptional heart care, we have created designated Provider Care Teams.  These Care Teams include your primary Cardiologist (physician) and Advanced Practice Providers (APPs -  Physician Assistants and Nurse Practitioners) who all work together to provide you with the care you need, when you need it.  We recommend signing up for the patient portal called "MyChart".  Sign up information is provided on this After Visit Summary.  MyChart is used to connect with patients for Virtual Visits (Telemedicine).  Patients are able to view lab/test results, encounter notes, upcoming appointments, etc.  Non-urgent messages can be sent to your provider as well.   To learn more about what you can do with MyChart, go to [facility].    Your next appointment:   3 month(s)  The format for your next appointment:   In Person  Provider:   You will see one of the following Advanced Practice  Providers on your designated Care Team:   [email or web address], PA-C Vin [place], PA-C [place], Georgie Chard [place], NP Nada Boozer, PA-C   Other Instructions

## 2020-10-28 ENCOUNTER — Telehealth: Payer: Self-pay | Admitting: Cardiovascular Disease

## 2020-10-28 NOTE — Telephone Encounter (Signed)
Spoke with the patient who states that he is unable to tolerate the Eliquis. He has stopped taking it. States that it was causing his muscles to ache. He has started back taking a baby aspirin daily.  Advised that I will let Dr. [name] know and that he will possibly try him on a different blood thinner. Patient verbalized understanding.

## 2020-10-28 NOTE — Telephone Encounter (Signed)
Pt c/o medication issue:  1. Name of Medication: Eliquis  2. How are you currently taking this medication (dosage and times per day)? Stopped taking medicine on 10/25/20  3. Are you having a reaction (difficulty breathing--STAT)? No  4. What is your medication issue? Pt was seen in office on 10/24/20 and started taking Eliquis, pt stopped taking Eliquis on 10/25/20 because it makes his whole body ache. Per Pt..he was asked to notify our office if he doesn't want to take the medicine any longer.

## 2020-10-31 NOTE — Telephone Encounter (Signed)
Since he cannot tolerate apixaban, recommend trial of Xarelto 20 mg if he is willing to try that. thanks

## 2020-11-01 ENCOUNTER — Ambulatory Visit: Payer: Medicare HMO | Admitting: Physician Assistant

## 2020-11-01 MED ORDER — RIVAROXABAN 20 MG PO TABS: 20.0000 mg | ORAL_TABLET | Freq: Every day | ORAL | 11 refills | Status: DC

## 2020-11-01 NOTE — Telephone Encounter (Signed)
Left message to call back  

## 2020-11-01 NOTE — Telephone Encounter (Signed)
Spoke with pt and he is agreeable to try Xarelto.  He will come by the office one day this week to pick up samples.  Pt aware to call if any issues with this medication.  He is aware to stop ASA when he starts Xarelto.

## 2020-11-08 ENCOUNTER — Ambulatory Visit (HOSPITAL_COMMUNITY): Payer: Medicare HMO | Attending: Internal Medicine

## 2020-11-08 ENCOUNTER — Other Ambulatory Visit: Payer: Self-pay

## 2020-11-08 DIAGNOSIS — I480 Paroxysmal atrial fibrillation: Secondary | ICD-10-CM | POA: Insufficient documentation

## 2020-11-08 LAB — ECHOCARDIOGRAM COMPLETE
AR max vel: 1.39 cm2
AV Area VTI: 1.65 cm2
AV Area mean vel: 1.62 cm2
AV Mean grad: 12.3 mmHg
AV Peak grad: 23.4 mmHg
Ao pk vel: 2.42 m/s
Area-P 1/2: 2.29 cm2
S' Lateral: 2.9 cm

## 2020-11-21 ENCOUNTER — Telehealth: Payer: Self-pay | Admitting: Cardiovascular Disease

## 2020-11-21 NOTE — Telephone Encounter (Signed)
Pt states that his SBP has been running higher than usual at 150-160.  DBP 79-84.  HR 59-74.  Has intermittent dizziness when BP elevated.  Gets better when BP comes down.  Asked when he is taking readings and he said prior to his medications.  Advised pt to monitor his BP at least two hours after medications this week and call on Friday with new readings.  Explained we expect BP to be slightly elevated as meds are wearing off and that we want to see what they look like on the meds.  Pt agreeable to plan and will call back Friday with new readings.

## 2020-11-21 NOTE — Telephone Encounter (Signed)
Pt c/o BP issue: STAT if pt c/o blurred vision, one-sided weakness or slurred speech  1. What are your last 5 BP readings? Runs 150-160  2. Are you having any other symptoms (ex. Dizziness, headache, blurred vision, passed out)? dizziness  3. What is your BP issue? Pt is calling with concerns hi BP is running higher than normal.Pt s wanting to know should he start back taking his medication again

## 2020-11-21 NOTE — Telephone Encounter (Signed)
Agree thank you 

## 2021-01-19 NOTE — Progress Notes (Signed)
Cardiology Office Note:    Date:  01/20/2021   ID:  Thomas Henderson, DOB [birth year], MRN [id number]  PCP:  Thomas Pen, MD   [facility] Providers Cardiologist:  Thomas Bollman, MD Cardiology APP:  Thomas Henderson     Referring MD: Thomas Pen, MD   Chief Complaint:  F/u for AFib     ASSESSMENT & PLAN:   Paroxysmal atrial fibrillation [facility]) He seems to be maintaining sinus rhythm by exam today.  Overall, he seems to be tolerating anticoagulation well.  He had a lot of questions regarding his diagnosis and how that diagnosis was made.  I explained that the diagnosis was made by electrocardiography.  We discussed the importance of anticoagulation to reduce the risk of stroke.  Continue rivaroxaban 20 mg daily.  Continue metoprolol succinate 50 mg daily.  Obtain follow-up BMET, CBC in 2 to 4 weeks.  Follow-up in 6 months.  Essential hypertension Blood pressure is elevated.  Repeat blood pressure by me was 146/86.  I did check his cuff today.  The cuff is too small.  It did read about 10 points higher on the systolic.  I have asked him to see if he can get a larger cuff.  Increase losartan to 50 mg daily.  Continue hydrochlorothiazide 25 mg daily, metoprolol succinate 50 mg daily.  Follow-up BMET in 2 to 4 weeks.  Thoracic ascending aortic aneurysm History of bioprosthetic aortic valve replacement and mitral valve repair in 2008.  He subsequently underwent repair of thoracic aortic aneurysm and redo aortic valve replacement in 2014.    Most recent echocardiogram demonstrates normal LV function, moderate diastolic dysfunction, normally functioning mitral valve repair and aortic valve replacement.  Continue SBE prophylaxis.  Benign positional vertigo He describes episodes of dizziness.  We did a monitor last year that demonstrated no significant arrhythmia.  His symptoms seem to be consistent with benign positional vertigo.  He describes a spinning sensation that is often  brought on with head position changes.  I offered to refer him to physical therapy.  However, he lives in [place].  Therefore, I have asked him to discuss this with primary care.    Patient Profile:   Thomas Henderson is a 80 y.o. male with:  Paroxysmal atrial fibrillation  Intol of Apixaban >> Rivaroxaban Valvular heart disease S/p bioprosthetic AVR and MV repair in 2008 S/p redo bioprosthetic AVR in 2014 Ascending Thoracic Aortic Aneurysm  S/p repair of TAA in 2014  Coronary artery disease  Ectatic coronary arteries with diffuse non-obstructive CAD Hypertension  Hyperlipidemia  GERD     Prior CV studies: CARDIAC TELEMETRY MONITORING-INTERPRETATION ONLY 12/09/2019 Narrative 1. The basic rhythm is normal sinus with an average HR of 83 bpm 2. No atrial fibrillation or flutter 3. No high-grade heart block or pathologic pauses 4. There are rare PVC's with few short 3-4 beat ventricular runs 5. There are rare supraventricular runs, none longer than 5 beats Benign monitor result  Echocardiogram 11/08/20 EF 60-65, no RWMA, GRII DD, moderate LVH, normal RVSF, moderate LAE, normally functioning MV repair with trivial MR, normally functioning AVR with mean gradient 12.2 mmHg  Chest CTA 10/07/20 Thomas Henderson) No PE Aortic atherosclerosis  Echocardiogram 10/28/17 Mild LVH, EF 55-60, no RWMA, s/p AVR with normal mean gradient (8 mmHg), no AI, s/p MV repair w mean gradient 3 mmHg   Carotid US 10/28/12 Bilat ICA 0-39   Cardiac catheterization 10/13/12 LAD diffuse ectasia, irreg suggestive of non-obstructive plaque LCx  diffuse irregularity RCA dilated      History of Present Illness: Thomas Henderson was last seen by Thomas Henderson 10/24/2020.  He had been diagnosed with atrial fibrillation with rapid rate by primary care and placed on apixaban and metoprolol.  He developed diffuse muscle aches and stopped both of these medications.  He was back in sinus rhythm when he saw Thomas Henderson.  He was placed back  on apixaban but not metoprolol.  A follow-up echocardiogram demonstrated normal ejection fraction, moderate diastolic dysfunction, normally functioning mitral valve repair and normally functioning aortic valve replacement with mean gradient 12.2 mmHg.  He developed recurrent myalgias on Apixaban and stopped the medication.  He was switched to rivaroxaban.  He returns for follow-up.  He is here alone.  He continues to have episodic dizzy spells.  He notes a spinning sensation with changes in head position.  He has not had chest pain.  He has shortness of breath with some activities but no significant change.  He has not had orthopnea, significant leg edema, syncope.        Past Medical History:  Diagnosis Date   Aortic insufficiency    a. 2008 - AVR w/ 20mm pericardial tissue valve;  a. 12/2007 Echo No AI.   Aortic root dilatation (HCC)    a. 12/2007 Echo: Ao Root 61mm   Arthritis    legs   Back pain    several back surgeries   CHF (congestive heart failure) (HCC)    a. h/o severe LV dysfxn in setting of valvular dzs in 2008;  b. 12/2007 Echo: EF 55-60%, No RWMA, No AI, Mod Ao Root Dil, Triv MR/PR/TR, NL RV.   Dyslipidemia    takes Simvastatin daily   ED (erectile dysfunction)    GERD (gastroesophageal reflux disease)    takes Protonix daily   History of colon polyps    HTN (hypertension)    takes Lisinopril and Metoprolol daily   Intermittent lightheadedness    Left knee DJD 11/07/2012   Mitral regurgitation    a. 2008 - MVR w/ 18mm annuloplasty ring;  b. 12/2007 Echo Triv MR   Near syncope    Monitor 9/21: NSR, average HR 83, no atrial fibrillation, high-grade heart block or pathologic pauses; rare PVCs with few short 3-4 beat ventricular runs and rare supraventricular runs (no longer than 5 beats)   Paroxysmal atrial fibrillation (HCC) 01/20/2021   Pseudogout of left knee 11/07/2012   S/P aortic valve replacement with bioprosthetic valve 08/13/2006   #56mm Edwards Magna pericardial tissue  valve   S/P ascending aortic aneurysm repair 10/30/2012   Redo sternotomy for repair of ascending thoracic aortic aneurysm using [place] aortic root replacement with [facility] Ease pericardial tissue valve-conduit (size 15mm valve, size 51mm graft conduit) with reimplantation of Left Main and Right Coronary Artery    S/P mitral valve repair 08/13/2006   #63mm Edwards Physio ring annuloplasty   Thoracic ascending aortic aneurysm 09/07/2011   Type I or II open fracture of left tibial plateau with malunion 11/07/2012   Current Medications: Current Meds  Medication Sig   Cholecalciferol (VITAMIN D3) 2000 UNITS TABS Take 1 capsule by mouth daily.   hydrochlorothiazide (HYDRODIURIL) 25 MG tablet Take 1 tablet (25 mg total) by mouth daily.   meloxicam (MOBIC) 15 MG tablet Take 15 mg by mouth daily.   metoprolol succinate (TOPROL-XL) 50 MG 24 hr tablet Take 50 mg by mouth daily. Take with or immediately following a meal.   rivaroxaban (  XARELTO) 20 MG TABS tablet Take 1 tablet (20 mg total) by mouth daily with supper.   [DISCONTINUED] losartan (COZAAR) 25 MG tablet Take 25 mg by mouth daily.    Allergies:   Patient has no known allergies.   Social History   Tobacco Use   Smoking status: Never   Smokeless tobacco: Never  Vaping Use   Vaping Use: Never used  Substance Use Topics   Alcohol use: No   Drug use: No    Family Hx: The patient's family history includes Cardiomyopathy in an other family member; Heart attack (age of onset: 47) in his brother; Other (age of onset: 69) in his mother.  Review of Systems  Gastrointestinal:  Negative for hematochezia.  Genitourinary:  Negative for hematuria.    EKGs/Labs/Other Test Reviewed:    EKG:  EKG is not ordered today.  The ekg ordered today demonstrates n/a  Recent Labs: No results found for requested labs within last 8760 hours.   Recent Lipid Panel Lab Results  Component Value Date/Time   CHOL 97 11/20/2013 09:52 AM   TRIG 74.0  11/20/2013 09:52 AM   HDL 28.40 (L) 11/20/2013 09:52 AM   LDLCALC 54 11/20/2013 09:52 AM     Risk Assessment/Calculations:    CHA2DS2-VASc Score = 3   This indicates a 3.2% annual risk of stroke. The patient's score is based upon: CHF History: 0 HTN History: 1 Diabetes History: 0 Stroke History: 0 Vascular Disease History: 0 Age Score: 2 Gender Score: 0         Physical Exam:    VS:  BP (!) 160/70   Pulse 70   Ht 5\' 6"  (1.676 m)   Wt 211 lb 12.8 oz (96.1 kg)   SpO2 98%   BMI 34.19 kg/m     Wt Readings from Last 3 Encounters:  01/20/21 211 lb 12.8 oz (96.1 kg)  10/24/20 208 lb (94.3 kg)  11/03/19 208 lb (94.3 kg)    Constitutional:      Appearance: Healthy appearance. Not in distress.  Neck:     Vascular: JVD normal.  Pulmonary:     Effort: Pulmonary effort is normal.     Breath sounds: No wheezing. No rales.  Cardiovascular:     Normal rate. Regular rhythm. Normal S1. Normal S2.      Murmurs: There is a grade 2/6 systolic murmur at the URSB.  Edema:    Peripheral edema absent.  Abdominal:     Palpations: Abdomen is soft.  Skin:    General: Skin is warm and dry.  Neurological:     General: No focal deficit present.     Mental Status: Alert and oriented to person, place and time.     Cranial Nerves: Cranial nerves are intact.        Dispo:  Return in about 6 months (around 07/21/2021) for Routine Follow Up, w/ Dr. 07/23/2021, or [name], PA-C.   Medication Adjustments/Labs and Tests Ordered: Current medicines are reviewed at length with the patient today.  Concerns regarding medicines are outlined above.  Tests Ordered: Orders Placed This Encounter  Procedures   Basic metabolic panel   CBC   Basic Metabolic Panel (BMET)   CBC    Medication Changes: Meds ordered this encounter  Medications   losartan (COZAAR) 50 MG tablet    Sig: Take 1 tablet (50 mg total) by mouth daily.    Dispense:  90 tablet    Refill:  3    Signed,  Tereso Newcomer,  PA-C  01/20/2021 1:33 PM    [facility] [address] [place], [place], [place]  [address] Phone: [phone]; Fax: [phone]

## 2021-01-20 ENCOUNTER — Encounter: Payer: Self-pay | Admitting: Physician Assistant

## 2021-01-20 ENCOUNTER — Other Ambulatory Visit: Payer: Self-pay

## 2021-01-20 ENCOUNTER — Ambulatory Visit: Payer: Medicare HMO | Admitting: Physician Assistant

## 2021-01-20 VITALS — BP 160/70 | HR 70 | Ht 66.0 in | Wt 211.8 lb

## 2021-01-20 DIAGNOSIS — Z9889 Other specified postprocedural states: Secondary | ICD-10-CM

## 2021-01-20 DIAGNOSIS — I10 Essential (primary) hypertension: Secondary | ICD-10-CM

## 2021-01-20 DIAGNOSIS — I38 Endocarditis, valve unspecified: Secondary | ICD-10-CM

## 2021-01-20 DIAGNOSIS — I480 Paroxysmal atrial fibrillation: Secondary | ICD-10-CM | POA: Diagnosis not present

## 2021-01-20 DIAGNOSIS — Z8679 Personal history of other diseases of the circulatory system: Secondary | ICD-10-CM

## 2021-01-20 DIAGNOSIS — Z953 Presence of xenogenic heart valve: Secondary | ICD-10-CM

## 2021-01-20 DIAGNOSIS — H8110 Benign paroxysmal vertigo, unspecified ear: Secondary | ICD-10-CM | POA: Diagnosis not present

## 2021-01-20 DIAGNOSIS — I7121 Aneurysm of the ascending aorta, without rupture: Secondary | ICD-10-CM

## 2021-01-20 HISTORY — DX: Paroxysmal atrial fibrillation: I48.0

## 2021-01-20 MED ORDER — LOSARTAN POTASSIUM 50 MG PO TABS: 50.0000 mg | ORAL_TABLET | Freq: Every day | ORAL | 3 refills | Status: AC

## 2021-01-20 NOTE — Assessment & Plan Note (Signed)
History of bioprosthetic aortic valve replacement and mitral valve repair in 2008.  He subsequently underwent repair of thoracic aortic aneurysm and redo aortic valve replacement in 2014.    Most recent echocardiogram demonstrates normal LV function, moderate diastolic dysfunction, normally functioning mitral valve repair and aortic valve replacement.  Continue SBE prophylaxis.

## 2021-01-20 NOTE — Assessment & Plan Note (Signed)
Blood pressure is elevated.  Repeat blood pressure by me was 146/86.  I did check his cuff today.  The cuff is too small.  It did read about 10 points higher on the systolic.  I have asked him to see if he can get a larger cuff.  Increase losartan to 50 mg daily.  Continue hydrochlorothiazide 25 mg daily, metoprolol succinate 50 mg daily.  Follow-up BMET in 2 to 4 weeks.

## 2021-01-20 NOTE — Assessment & Plan Note (Signed)
He describes episodes of dizziness.  We did a monitor last year that demonstrated no significant arrhythmia.  His symptoms seem to be consistent with benign positional vertigo.  He describes a spinning sensation that is often brought on with head position changes.  I offered to refer him to physical therapy.  However, he lives in [place].  Therefore, I have asked him to discuss this with primary care.

## 2021-01-20 NOTE — Patient Instructions (Addendum)
Medication Instructions:   INCREASE LOSARTAN one tablet by mouth ( 50 mg) daily.   *If you need a refill on your cardiac medications before your next appointment, please call your pharmacy*  Lab Work:  2-4 weeks.  Please get CBC/BMET in [place] either at [place] office @ [address] or [employer].   If you have labs (blood work) drawn today and your tests are completely normal, you will receive your results only by: [employer] (if you have MyChart) OR A paper copy in the mail If you have any lab test that is abnormal or we need to change your treatment, we will call you to review the results.  Testing/Procedures:  -NONE  Follow-Up: At [facility], you and your health needs are our priority.  As part of our continuing mission to provide you with exceptional heart care, we have created designated Provider Care Teams.  These Care Teams include your primary Cardiologist (physician) and Advanced Practice Providers (APPs -  Physician Assistants and Nurse Practitioners) who all work together to provide you with the care you need, when you need it.  We recommend signing up for the patient portal called "MyChart".  Sign up information is provided on this After Visit Summary.  MyChart is used to connect with patients for Virtual Visits (Telemedicine).  Patients are able to view lab/test results, encounter notes, upcoming appointments, etc.  Non-urgent messages can be sent to your provider as well.   To learn more about what you can do with MyChart, go to [email or web address].    Your next appointment:   6 month(s)  The format for your next appointment:   In Person  Provider:   You may see Thomas Bollman, MD or one of the following Advanced Practice Providers on your designated Care Team:   Tereso Newcomer, PA-C  Other Instructions  Your physician wants you to follow-up in: 6 month with Dr. [name] or Tereso Newcomer, PA-C. You will receive a reminder letter in the mail two months in  advance. If you don't receive a letter, please call our office to schedule the follow-up appointment.  Please follow up with your PCP and ask about a referral to Physical Therapy for your Vertigo.

## 2021-01-20 NOTE — Assessment & Plan Note (Signed)
He seems to be maintaining sinus rhythm by exam today.  Overall, he seems to be tolerating anticoagulation well.  He had a lot of questions regarding his diagnosis and how that diagnosis was made.  I explained that the diagnosis was made by electrocardiography.  We discussed the importance of anticoagulation to reduce the risk of stroke.  Continue rivaroxaban 20 mg daily.  Continue metoprolol succinate 50 mg daily.  Obtain follow-up BMET, CBC in 2 to 4 weeks.  Follow-up in 6 months.

## 2021-08-18 ENCOUNTER — Encounter: Payer: Self-pay | Admitting: Cardiovascular Disease

## 2021-08-18 ENCOUNTER — Ambulatory Visit: Payer: Medicare PPO | Admitting: Cardiovascular Disease

## 2021-08-18 VITALS — BP 120/74 | HR 78 | Ht 66.0 in | Wt 209.6 lb

## 2021-08-18 DIAGNOSIS — N522 Drug-induced erectile dysfunction: Secondary | ICD-10-CM

## 2021-08-18 DIAGNOSIS — I10 Essential (primary) hypertension: Secondary | ICD-10-CM | POA: Diagnosis not present

## 2021-08-18 DIAGNOSIS — I38 Endocarditis, valve unspecified: Secondary | ICD-10-CM | POA: Diagnosis not present

## 2021-08-18 DIAGNOSIS — I7121 Aneurysm of the ascending aorta, without rupture: Secondary | ICD-10-CM | POA: Diagnosis not present

## 2021-08-18 DIAGNOSIS — I480 Paroxysmal atrial fibrillation: Secondary | ICD-10-CM | POA: Diagnosis not present

## 2021-08-18 MED ORDER — SILDENAFIL CITRATE 50 MG PO TABS: 50.0000 mg | ORAL_TABLET | ORAL | 6 refills | Status: AC | PRN

## 2021-08-18 NOTE — Progress Notes (Signed)
Cardiology Office Note:    Date:  08/18/2021   ID:  [name], DOB 1940-06-14, MRN [id number]  PCP:  Myrlene Broker, MD   [facility] Providers Cardiologist:  Sherren Mocha, MD Cardiology APP:  Sharmon Revere     Referring MD: Myrlene Broker, MD   Chief Complaint  Patient presents with   Follow-up    Valvular heart disease    History of Present Illness:    Thomas Henderson is a 81 y.o. male with a hx of: Paroxysmal atrial fibrillation  Intol of Apixaban >> Rivaroxaban D/C'd rivaroxaban secondary to 'feeling bad all over' Valvular heart disease S/p bioprosthetic AVR and MV repair in 2008 S/p redo bioprosthetic AVR in 2014 Ascending Thoracic Aortic Aneurysm  S/p repair of TAA in 2014  Coronary artery disease  Ectatic coronary arteries with diffuse non-obstructive CAD Hypertension  Hyperlipidemia  GERD   The patient is here alone today.  He is doing well from a cardiac perspective with no complaints of chest pain, shortness of breath, or leg swelling.  He denies heart palpitations, orthopnea, or PND.  He does have occasional dizziness which has been longstanding.  He complains of calf discomfort bilaterally, but not exacerbated by walking.  This primarily occurs at rest.  He complains of erectile dysfunction.  He has stopped taking rivaroxaban on his own and is back to taking aspirin 81 mg daily.  He has now had intolerances to apixaban and rivaroxaban and states today that he does not want to take any blood thinning medicines.  Past Medical History:  Diagnosis Date   Aortic insufficiency    a. 2008 - AVR w/ 80mm pericardial tissue valve;  a. 12/2007 Echo No AI.   Aortic root dilatation ([place])    a. 12/2007 Echo: Ao Root 47mm   Arthritis    legs   Back pain    several back surgeries   CHF (congestive heart failure) ([place])    a. h/o severe LV dysfxn in setting of valvular dzs in 2008;  b. 12/2007 Echo: EF 55-60%, No RWMA, No AI, Mod Ao Root Dil, Triv  MR/PR/TR, NL RV.   Dyslipidemia    takes Simvastatin daily   ED (erectile dysfunction)    GERD (gastroesophageal reflux disease)    takes Protonix daily   History of colon polyps    HTN (hypertension)    takes Lisinopril and Metoprolol daily   Intermittent lightheadedness    Left knee DJD 11/07/2012   Mitral regurgitation    a. 2008 - MVR w/ 53mm annuloplasty ring;  b. 12/2007 Echo Triv MR   Near syncope    Monitor 9/21: NSR, average HR 83, no atrial fibrillation, high-grade heart block or pathologic pauses; rare PVCs with few short 3-4 beat ventricular runs and rare supraventricular runs (no longer than 5 beats)   Paroxysmal atrial fibrillation ([place]) 01/20/2021   Pseudogout of left knee 11/07/2012   S/P aortic valve replacement with bioprosthetic valve 08/13/2006   #31mm Edwards Magna pericardial tissue valve   S/P ascending aortic aneurysm repair 10/30/2012   Redo sternotomy for repair of ascending thoracic aortic aneurysm using Bentall aortic root replacement with [facility] Ease pericardial tissue valve-conduit (size 52mm valve, size 22mm graft conduit) with reimplantation of Left Main and Right Coronary Artery    S/P mitral valve repair 08/13/2006   #22mm Edwards Physio ring annuloplasty   Thoracic ascending aortic aneurysm ([place]) 09/07/2011   Type I or II open fracture of left  tibial plateau with malunion 11/07/2012    Past Surgical History:  Procedure Laterality Date   AORTIC VALVE REPLACEMENT  08/13/2006   #8mm Edwards Magna pericardial tissue valve   AORTIC VALVE REPLACEMENT N/A 10/30/2012   Procedure: REDO AORTIC VALVE REPLACEMENT (AVR);  Surgeon: Purcell Nails, MD;  Location: [facility] OR;  Service: Open Heart Surgery;  Laterality: N/A;   BENTALL PROCEDURE N/A 10/30/2012   Procedure: BENTALL PROCEDURE;  Surgeon: Purcell Nails, MD;  Location: [facility] OR;  Service: Open Heart Surgery;  Laterality: N/A;   CARDIAC CATHETERIZATION  07/31/06   10/13/12   COLONOSCOPY     EYE SURGERY     left    LEFT AND RIGHT HEART CATHETERIZATION WITH CORONARY ANGIOGRAM N/A 10/13/2012   Procedure: LEFT AND RIGHT HEART CATHETERIZATION WITH CORONARY ANGIOGRAM;  Surgeon: Tonny Bollman, MD;  Location: [facility] CATH LAB;  Service: Cardiovascular;  Laterality: N/A;   LEG SURGERY     left   LUMBAR DISC SURGERY     LUMBAR LAMINECTOMY     MITRAL VALVE REPAIR  08/13/2006   #29mm Edwards Physio ring annuloplasty   REPLACEMENT ASCENDING AORTA  10/30/2012   Procedure: REPLACEMENT ASCENDING AORTA;  Surgeon: Purcell Nails, MD;  Location: [facility] OR;  Service: Open Heart Surgery;;   TONSILLECTOMY      Current Medications: Current Meds  Medication Sig   Cholecalciferol (VITAMIN D3) 2000 UNITS TABS Take 1 capsule by mouth daily.   hydrochlorothiazide (HYDRODIURIL) 25 MG tablet Take 1 tablet (25 mg total) by mouth daily.   losartan (COZAAR) 50 MG tablet Take 1 tablet (50 mg total) by mouth daily.   meloxicam (MOBIC) 15 MG tablet Take 15 mg by mouth daily.   metoprolol succinate (TOPROL-XL) 50 MG 24 hr tablet Take 50 mg by mouth daily. Take with or immediately following a meal.   [DISCONTINUED] sildenafil (VIAGRA) 50 MG tablet Take by mouth as needed.     Allergies:   Patient has no known allergies.   Social History   Socioeconomic History   Marital status: Married    Spouse name: Not on file   Number of children: 3   Years of education: Not on file   Highest education level: Not on file  Occupational History   Occupation: retired Curator  Tobacco Use   Smoking status: Never   Smokeless tobacco: Never  Vaping Use   Vaping Use: Never used  Substance and Sexual Activity   Alcohol use: No   Drug use: No   Sexual activity: Yes  Other Topics Concern   Not on file  Social History Narrative   Not on file   Social Determinants of Health   Financial Resource Strain: Not on file  Food Insecurity: Not on file  Transportation Needs: Not on file  Physical Activity: Not on file  Stress: Not on file  Social  Connections: Not on file     Family History: The patient's family history includes Cardiomyopathy in an other family member; Heart attack (age of onset: 42) in his brother; Other (age of onset: 77) in his mother.  ROS:   Please see the history of present illness.    All other systems reviewed and are negative.  EKGs/Labs/Other Studies Reviewed:    The following studies were reviewed today: Echo 11/08/20: 1. Left ventricular ejection fraction, by estimation, is 60 to 65%. The  left ventricle has normal function. The left ventricle has no regional  wall motion abnormalities. There is moderate left ventricular hypertrophy.  Left ventricular diastolic  parameters are consistent with Grade II diastolic dysfunction  (pseudonormalization). Elevated left ventricular end-diastolic pressure.  The E/e' is 24.   2. Right ventricular systolic function is normal. The right ventricular  size is normal.   3. Left atrial size was moderately dilated.   4. The mitral valve has been repaired/replaced. Trivial mitral valve  regurgitation. No evidence of mitral stenosis. There is a 26 mm prosthetic  annuloplasty ring present in the mitral position. Procedure Date:  08/13/2006.   5. The aortic valve has been repaired/replaced. Aortic valve  regurgitation is not visualized. There is a 25 mm Edwards MagnaEase valve  present in the aortic position. Procedure Date: 08/13/2006. Aortic valve  mean gradient measures 12.2 mmHg. Aortic valve   Vmax measures 2.42 m/s.   6. Bentall aortic root replacement - 28 mm conduit (10/30/2012). Aortic  root/ascending aorta has been repaired/replaced.   7. The inferior vena cava is normal in size with greater than 50%  respiratory variability, suggesting right atrial pressure of 3 mmHg.   Comparison(s): Changes from prior study are noted. 10/28/2017: LVEF 55-60%,  bioprosthetic AVR - mean gradient 8 mmHg, s/p MV annuloplasty repair.   EKG:  EKG is not ordered today.     Recent Labs: No results found for requested labs within last 8760 hours.  Recent Lipid Panel    Component Value Date/Time   CHOL 97 11/20/2013 0952   TRIG 74.0 11/20/2013 0952   HDL 28.40 (L) 11/20/2013 0952   CHOLHDL 3 11/20/2013 0952   VLDL 14.8 11/20/2013 0952   LDLCALC 54 11/20/2013 0952     Risk Assessment/Calculations:    CHA2DS2-VASc Score = 3   This indicates a 3.2% annual risk of stroke. The patient's score is based upon: CHF History: 0 HTN History: 1 Diabetes History: 0 Stroke History: 0 Vascular Disease History: 0 Age Score: 2 Gender Score: 0          Physical Exam:    VS:  BP 120/74   Pulse 78   Ht 5\' 6"  (1.676 m)   Wt 209 lb 9.6 oz (95.1 kg)   SpO2 99%   BMI 33.83 kg/m     Wt Readings from Last 3 Encounters:  08/18/21 209 lb 9.6 oz (95.1 kg)  01/20/21 211 lb 12.8 oz (96.1 kg)  10/24/20 208 lb (94.3 kg)     GEN:  Well nourished, well developed in no acute distress HEENT: Normal NECK: No JVD; No carotid bruits LYMPHATICS: No lymphadenopathy CARDIAC: RRR, 2/6 systolic ejection murmur at the right upper sternal border RESPIRATORY:  Clear to auscultation without rales, wheezing or rhonchi  ABDOMEN: Soft, non-tender, non-distended MUSCULOSKELETAL:  No edema; No deformity  SKIN: Warm and dry NEUROLOGIC:  Alert and oriented x 3 PSYCHIATRIC:  Normal affect   ASSESSMENT:    1. Paroxysmal atrial fibrillation (HCC)   2. Aneurysm of ascending aorta without rupture ([place])   3. Essential hypertension   4. Valvular heart disease   5. Drug-induced erectile dysfunction    PLAN:    In order of problems listed above:  My exam he appears to be maintaining sinus rhythm.  He has not had any atrial fibrillation related symptoms.  He is treated with metoprolol succinate.  The patient has discontinued rivaroxaban due to side effects.  He has now discontinued both apixaban and rivaroxaban.  I discussed consideration of Pradaxa and warfarin with him.  He  does not want to take anticoagulant drugs.  He understands the  increased risk of stroke and thromboembolism is approximately 3% annually.  He will continue on aspirin 81 mg daily.  A shared decision-making discussion occurred today. The patient has been stable after surgical repair.  He had a CT of the chest last year at [facility].  I reviewed the report again today and there is no comment about residual aneurysmal disease. Blood pressure is well controlled on losartan, hydrochlorothiazide, and metoprolol succinate. The patient is status post aortic valve replacement and mitral valve repair with normal function at both valve sites based on echocardiogram last year Prescription for sildenafil written at patient's request     Medication Adjustments/Labs and Tests Ordered: Current medicines are reviewed at length with the patient today.  Concerns regarding medicines are outlined above.  No orders of the defined types were placed in this encounter.  Meds ordered this encounter  Medications   sildenafil (VIAGRA) 50 MG tablet    Sig: Take 1 tablet (50 mg total) by mouth as needed.    Dispense:  10 tablet    Refill:  6    Patient Instructions  Medication Instructions:  Refilled Sildenafil *If you need a refill on your cardiac medications before your next appointment, please call your pharmacy*   Lab Work: NONE If you have labs (blood work) drawn today and your tests are completely normal, you will receive your results only by: [place] (if you have MyChart) OR A paper copy in the mail If you have any lab test that is abnormal or we need to change your treatment, we will call you to review the results.   Testing/Procedures: NONE   Follow-Up: At [facility], you and your health needs are our priority.  As part of our continuing mission to provide you with exceptional heart care, we have created designated Provider Care Teams.  These Care Teams include your primary  Cardiologist (physician) and Advanced Practice Providers (APPs -  Physician Assistants and Nurse Practitioners) who all work together to provide you with the care you need, when you need it.  We recommend signing up for the patient portal called "MyChart".  Sign up information is provided on this After Visit Summary.  MyChart is used to connect with patients for Virtual Visits (Telemedicine).  Patients are able to view lab/test results, encounter notes, upcoming appointments, etc.  Non-urgent messages can be sent to your provider as well.   To learn more about what you can do with MyChart, go to [email or web address].    Your next appointment:   1 year(s)  The format for your next appointment:   In Person  Provider:   Sherren Mocha, MD       Important Information About Sugar         Signed, Sherren Mocha, MD  08/18/2021 8:29 AM    [facility]

## 2021-08-18 NOTE — Patient Instructions (Signed)
Medication Instructions:  Refilled Sildenafil *If you need a refill on your cardiac medications before your next appointment, please call your pharmacy*   Lab Work: NONE If you have labs (blood work) drawn today and your tests are completely normal, you will receive your results only by: MyChart Message (if you have MyChart) OR A paper copy in the mail If you have any lab test that is abnormal or we need to change your treatment, we will call you to review the results.   Testing/Procedures: NONE   Follow-Up: At [facility], you and your health needs are our priority.  As part of our continuing mission to provide you with exceptional heart care, we have created designated Provider Care Teams.  These Care Teams include your primary Cardiologist (physician) and Advanced Practice Providers (APPs -  Physician Assistants and Nurse Practitioners) who all work together to provide you with the care you need, when you need it.  We recommend signing up for the patient portal called "MyChart".  Sign up information is provided on this After Visit Summary.  MyChart is used to connect with patients for Virtual Visits (Telemedicine).  Patients are able to view lab/test results, encounter notes, upcoming appointments, etc.  Non-urgent messages can be sent to your provider as well.   To learn more about what you can do with MyChart, go to [email or web address].    Your next appointment:   1 year(s)  The format for your next appointment:   In Person  Provider:   Tonny Bollman, MD       Important Information About Sugar

## 2022-09-05 ENCOUNTER — Encounter: Payer: Self-pay | Admitting: Cardiovascular Disease

## 2022-09-05 ENCOUNTER — Ambulatory Visit: Payer: PPO | Attending: Cardiovascular Disease | Admitting: Cardiovascular Disease

## 2022-09-05 VITALS — BP 124/82 | HR 64 | Ht 66.0 in | Wt 205.0 lb

## 2022-09-05 DIAGNOSIS — I480 Paroxysmal atrial fibrillation: Secondary | ICD-10-CM | POA: Diagnosis not present

## 2022-09-05 DIAGNOSIS — I38 Endocarditis, valve unspecified: Secondary | ICD-10-CM

## 2022-09-05 DIAGNOSIS — I10 Essential (primary) hypertension: Secondary | ICD-10-CM | POA: Diagnosis not present

## 2022-09-05 DIAGNOSIS — I7121 Aneurysm of the ascending aorta, without rupture: Secondary | ICD-10-CM

## 2022-09-05 NOTE — Progress Notes (Signed)
Cardiology Office Note:    Date:  09/05/2022   ID:  DURANT RODRIGES, DOB November 04, 1940, MRN [id number]  PCP:  Hadley Pen, MD   [facility] Providers Cardiologist:  Tonny Bollman, MD Cardiology APP:  Kennon Rounds     Referring MD: Hadley Pen, MD   Chief Complaint  Patient presents with   Follow-up    Valvular heart disease    History of Present Illness:    Thomas Henderson is a 82 y.o. male presenting for follow-up evaluation.  His cardiovascular problem list is outlined below:  Paroxysmal atrial fibrillation  Intol of Apixaban >> Rivaroxaban D/C'd rivaroxaban secondary to 'feeling bad all over' Valvular heart disease S/p bioprosthetic AVR and MV repair in 2008 S/p redo bioprosthetic AVR in 2014 Ascending Thoracic Aortic Aneurysm  S/p repair of TAA in 2014  Coronary artery disease  Ectatic coronary arteries with diffuse non-obstructive CAD Hypertension  Hyperlipidemia  GERD   The patient is here alone today.  He is doing pretty well overall.  He has occasional shortness of breath with activity but states that he gets a "second wind" and his breathing is improved with ability to continue on with his physical activity.  He denies chest pain or pressure.  Denies orthopnea or PND.  He had some swelling in his right leg after dropping a table on his leg.  No heart palpitations, lightheadedness, or syncope. Past Medical History:  Diagnosis Date   Aortic insufficiency    a. 2008 - AVR w/ 25mm pericardial tissue valve;  a. 12/2007 Echo No AI.   Aortic root dilatation (HCC)    a. 12/2007 Echo: Ao Root 48mm   Arthritis    legs   Back pain    several back surgeries   CHF (congestive heart failure) (HCC)    a. h/o severe LV dysfxn in setting of valvular dzs in 2008;  b. 12/2007 Echo: EF 55-60%, No RWMA, No AI, Mod Ao Root Dil, Triv MR/PR/TR, NL RV.   Dyslipidemia    takes Simvastatin daily   ED (erectile dysfunction)    GERD (gastroesophageal reflux  disease)    takes Protonix daily   History of colon polyps    HTN (hypertension)    takes Lisinopril and Metoprolol daily   Intermittent lightheadedness    Left knee DJD 11/07/2012   Mitral regurgitation    a. 2008 - MVR w/ 26mm annuloplasty ring;  b. 12/2007 Echo Triv MR   Near syncope    Monitor 9/21: NSR, average HR 83, no atrial fibrillation, high-grade heart block or pathologic pauses; rare PVCs with few short 3-4 beat ventricular runs and rare supraventricular runs (no longer than 5 beats)   Paroxysmal atrial fibrillation (HCC) 01/20/2021   Pseudogout of left knee 11/07/2012   S/P aortic valve replacement with bioprosthetic valve 08/13/2006   #38mm Edwards Magna pericardial tissue valve   S/P ascending aortic aneurysm repair 10/30/2012   Redo sternotomy for repair of ascending thoracic aortic aneurysm using Bentall aortic root replacement with [facility] Ease pericardial tissue valve-conduit (size 25mm valve, size 28mm graft conduit) with reimplantation of Left Main and Right Coronary Artery    S/P mitral valve repair 08/13/2006   #35mm Edwards Physio ring annuloplasty   Thoracic ascending aortic aneurysm (HCC) 09/07/2011   Type I or II open fracture of left tibial plateau with malunion 11/07/2012    Past Surgical History:  Procedure Laterality Date   AORTIC VALVE REPLACEMENT  08/13/2006   #  25mm Edwards Magna pericardial tissue valve   AORTIC VALVE REPLACEMENT N/A 10/30/2012   Procedure: REDO AORTIC VALVE REPLACEMENT (AVR);  Surgeon: Purcell Nails, MD;  Location: [facility] OR;  Service: Open Heart Surgery;  Laterality: N/A;   BENTALL PROCEDURE N/A 10/30/2012   Procedure: BENTALL PROCEDURE;  Surgeon: Purcell Nails, MD;  Location: [facility] OR;  Service: Open Heart Surgery;  Laterality: N/A;   CARDIAC CATHETERIZATION  07/31/06   10/13/12   COLONOSCOPY     EYE SURGERY     left   LEFT AND RIGHT HEART CATHETERIZATION WITH CORONARY ANGIOGRAM N/A 10/13/2012   Procedure: LEFT AND RIGHT HEART  CATHETERIZATION WITH CORONARY ANGIOGRAM;  Surgeon: Tonny Bollman, MD;  Location: [facility] CATH LAB;  Service: Cardiovascular;  Laterality: N/A;   LEG SURGERY     left   LUMBAR DISC SURGERY     LUMBAR LAMINECTOMY     MITRAL VALVE REPAIR  08/13/2006   #25mm Edwards Physio ring annuloplasty   REPLACEMENT ASCENDING AORTA  10/30/2012   Procedure: REPLACEMENT ASCENDING AORTA;  Surgeon: Purcell Nails, MD;  Location: [facility] OR;  Service: Open Heart Surgery;;   TONSILLECTOMY      Current Medications: Current Meds  Medication Sig   aspirin EC 81 MG tablet Take 81 mg by mouth daily.   Cholecalciferol (VITAMIN D3) 2000 UNITS TABS Take 1 capsule by mouth daily.   cyanocobalamin (VITAMIN B12) 1000 MCG tablet Take 1,000 mcg by mouth daily. Per patient taking gummies   hydrochlorothiazide (HYDRODIURIL) 25 MG tablet Take 1 tablet (25 mg total) by mouth daily.   meloxicam (MOBIC) 15 MG tablet Take 15 mg by mouth daily.   metoprolol succinate (TOPROL-XL) 50 MG 24 hr tablet Take 50 mg by mouth daily. Take with or immediately following a meal.   sildenafil (VIAGRA) 50 MG tablet Take 1 tablet (50 mg total) by mouth as needed.     Allergies:   Patient has no known allergies.   Social History   Socioeconomic History   Marital status: Married    Spouse name: Not on file   Number of children: 3   Years of education: Not on file   Highest education level: Not on file  Occupational History   Occupation: retired Curator  Tobacco Use   Smoking status: Never   Smokeless tobacco: Never  Vaping Use   Vaping Use: Never used  Substance and Sexual Activity   Alcohol use: No   Drug use: No   Sexual activity: Yes  Other Topics Concern   Not on file  Social History Narrative   Not on file   Social Determinants of Health   Financial Resource Strain: Not on file  Food Insecurity: Not on file  Transportation Needs: Not on file  Physical Activity: Not on file  Stress: Not on file  Social Connections: Not on  file     Family History: The patient's family history includes Cardiomyopathy in an other family member; Heart attack (age of onset: 35) in his brother; Other (age of onset: 22) in his mother.  ROS:   Please see the history of present illness.    Positive for erectile dysfunction.  All other systems reviewed and are negative.  EKGs/Labs/Other Studies Reviewed:    The following studies were reviewed today: Cardiac Studies & Procedures       ECHOCARDIOGRAM  ECHOCARDIOGRAM COMPLETE 11/08/2020  Narrative ECHOCARDIOGRAM REPORT    Patient Name:   TREMOND PRADA Date of Exam: 11/08/2020 Medical Rec #:  [id number]      Height:       66.0 in Accession #:    [id number]     Weight:       208.0 lb Date of Birth:  12-25-1940       BSA:          2.033 m Patient Age:    80 years       BP:           120/80 mmHg Patient Gender: M              HR:           69 bpm. Exam Location:  [address]  Procedure: 2D Echo, Cardiac Doppler and Color Doppler  Indications:    I48.0 Paroxysmal atrial fibrillation  History:        Patient has prior history of Echocardiogram examinations, most recent 10/28/2017. CHF, S/p AVR, Arrythmias:Atrial Fibrillation; Risk Factors:Dyslipidemia. Aortic Valve: 25 mm Edwards MagnaEase valve is present in the aortic position. Procedure Date: 08/13/2006. Mitral Valve: 26 mm prosthetic annuloplasty ring valve is present in the mitral position. Procedure Date: 08/13/2006.  Sonographer:    Samule Ohm RDCS Referring Phys: [phone] Keymari Sato  IMPRESSIONS   1. Left ventricular ejection fraction, by estimation, is 60 to 65%. The left ventricle has normal function. The left ventricle has no regional wall motion abnormalities. There is moderate left ventricular hypertrophy. Left ventricular diastolic parameters are consistent with Grade II diastolic dysfunction (pseudonormalization). Elevated left ventricular end-diastolic pressure. The E/e' is 24. 2. Right ventricular  systolic function is normal. The right ventricular size is normal. 3. Left atrial size was moderately dilated. 4. The mitral valve has been repaired/replaced. Trivial mitral valve regurgitation. No evidence of mitral stenosis. There is a 26 mm prosthetic annuloplasty ring present in the mitral position. Procedure Date: 08/13/2006. 5. The aortic valve has been repaired/replaced. Aortic valve regurgitation is not visualized. There is a 25 mm Edwards MagnaEase valve present in the aortic position. Procedure Date: 08/13/2006. Aortic valve mean gradient measures 12.2 mmHg. Aortic valve Vmax measures 2.42 m/s. 6. Bentall aortic root replacement - 28 mm conduit (10/30/2012). Aortic root/ascending aorta has been repaired/replaced. 7. The inferior vena cava is normal in size with greater than 50% respiratory variability, suggesting right atrial pressure of 3 mmHg.  Comparison(s): Changes from prior study are noted. 10/28/2017: LVEF 55-60%, bioprosthetic AVR - mean gradient 8 mmHg, s/p MV annuloplasty repair.  FINDINGS Left Ventricle: Left ventricular ejection fraction, by estimation, is 60 to 65%. The left ventricle has normal function. The left ventricle has no regional wall motion abnormalities. The left ventricular internal cavity size was normal in size. There is moderate left ventricular hypertrophy. Left ventricular diastolic parameters are consistent with Grade II diastolic dysfunction (pseudonormalization). Elevated left ventricular end-diastolic pressure. The E/e' is 24.  Right Ventricle: The right ventricular size is normal. No increase in right ventricular wall thickness. Right ventricular systolic function is normal.  Left Atrium: Left atrial size was moderately dilated.  Right Atrium: Right atrial size was normal in size.  Pericardium: There is no evidence of pericardial effusion.  Mitral Valve: The mitral valve has been repaired/replaced. Trivial mitral valve regurgitation. There is a 26 mm  prosthetic annuloplasty ring present in the mitral position. Procedure Date: 08/13/2006. No evidence of mitral valve stenosis.  Tricuspid Valve: The tricuspid valve is grossly normal. Tricuspid valve regurgitation is trivial.  Aortic Valve: The aortic valve has been repaired/replaced. Aortic valve regurgitation is not visualized.  Aortic valve mean gradient measures 12.2 mmHg. Aortic valve peak gradient measures 23.4 mmHg. Aortic valve area, by VTI measures 1.65 cm. There is a 25 mm Edwards MagnaEase valve present in the aortic position. Procedure Date: 08/13/2006.  Pulmonic Valve: The pulmonic valve was grossly normal. Pulmonic valve regurgitation is trivial.  Aorta: Bentall aortic root replacement - 28 mm conduit (10/30/2012). The aortic root/ascending aorta has been repaired/replaced.  Venous: The inferior vena cava is normal in size with greater than 50% respiratory variability, suggesting right atrial pressure of 3 mmHg.  IAS/Shunts: No atrial level shunt detected by color flow Doppler.   LEFT VENTRICLE PLAX 2D LVIDd:         4.20 cm  Diastology LVIDs:         2.90 cm  LV e' medial:    7.29 cm/s LV PW:         1.50 cm  LV E/e' medial:  23.7 LV IVS:        1.20 cm  LV e' lateral:   6.42 cm/s LVOT diam:     2.00 cm  LV E/e' lateral: 26.9 LV SV:         84 LV SV Index:   41 LVOT Area:     3.14 cm   RIGHT VENTRICLE             IVC RV S prime:     13.50 cm/s  IVC diam: 0.80 cm TAPSE (M-mode): 2.0 cm  LEFT ATRIUM             Index       RIGHT ATRIUM           Index LA diam:        5.00 cm 2.46 cm/m  RA Pressure: 3.00 mmHg LA Vol (A2C):   62.9 ml 30.93 ml/m RA Area:     15.00 cm LA Vol (A4C):   92.5 ml 45.49 ml/m RA Volume:   41.10 ml  20.21 ml/m LA Biplane Vol: 82.8 ml 40.72 ml/m AORTIC VALVE AV Area (Vmax):    1.39 cm AV Area (Vmean):   1.62 cm AV Area (VTI):     1.65 cm AV Vmax:           241.75 cm/s AV Vmean:          161.750 cm/s AV VTI:            0.509 m AV  Peak Grad:      23.4 mmHg AV Mean Grad:      12.2 mmHg LVOT Vmax:         107.00 cm/s LVOT Vmean:        83.300 cm/s LVOT VTI:          0.268 m LVOT/AV VTI ratio: 0.53  AORTA Ao Root diam: 3.50 cm Ao Asc diam:  2.90 cm  MITRAL VALVE                TRICUSPID VALVE MV Area (PHT): 2.29 cm     Estimated RAP:  3.00 mmHg MV Decel Time: 331 msec MV E velocity: 173.00 cm/s  SHUNTS MV A velocity: 116.00 cm/s  Systemic VTI:  0.27 m MV E/A ratio:  1.49         Systemic Diam: 2.00 cm  Zoila Shutter MD Electronically signed by Zoila Shutter MD Signature Date/Time: 11/08/2020/12:02:53 PM    Final    MONITORS  LONG TERM MONITOR-LIVE TELEMETRY (3-14 DAYS) 12/28/2019  Narrative 1. The basic rhythm  is normal sinus with an average HR of 83 bpm 2. No atrial fibrillation or flutter 3. No high-grade heart block or pathologic pauses 4. There are rare PVC's with few short 3-4 beat ventricular runs 5. There are rare supraventricular runs, none longer than 5 beats  Benign monitor result            EKG:  EKG is ordered today.  The ekg ordered today demonstrates normal sinus rhythm 64 bpm with sinus arrhythmia, minimal voltage criteria for LVH may be normal variant.  Recent Labs: No results found for requested labs within last 365 days.  Recent Lipid Panel    Component Value Date/Time   CHOL 97 11/20/2013 0952   TRIG 74.0 11/20/2013 0952   HDL 28.40 (L) 11/20/2013 0952   CHOLHDL 3 11/20/2013 0952   VLDL 14.8 11/20/2013 0952   LDLCALC 54 11/20/2013 0952     Risk Assessment/Calculations:    CHA2DS2-VASc Score = 3   This indicates a 3.2% annual risk of stroke. The patient's score is based upon: CHF History: 0 HTN History: 1 Diabetes History: 0 Stroke History: 0 Vascular Disease History: 0 Age Score: 2 Gender Score: 0               Physical Exam:    VS:  BP 124/82   Pulse 64   Ht 5\' 6"  (1.676 m)   Wt 205 lb (93 kg)   SpO2 97%   BMI 33.09 kg/m     Wt Readings from  Last 3 Encounters:  09/05/22 205 lb (93 kg)  08/18/21 209 lb 9.6 oz (95.1 kg)  01/20/21 211 lb 12.8 oz (96.1 kg)     GEN:  Well nourished, well developed in no acute distress HEENT: Normal NECK: No JVD; No carotid bruits LYMPHATICS: No lymphadenopathy CARDIAC: RRR, 2/6 systolic ejection murmur at the right upper sternal border, no diastolic murmur RESPIRATORY:  Clear to auscultation without rales, wheezing or rhonchi  ABDOMEN: Soft, non-tender, non-distended MUSCULOSKELETAL:  No edema; No deformity  SKIN: Warm and dry NEUROLOGIC:  Alert and oriented x 3 PSYCHIATRIC:  Normal affect   ASSESSMENT:    1. Paroxysmal atrial fibrillation (HCC)   2. Aneurysm of ascending aorta without rupture (HCC)   3. Essential hypertension   4. Valvular heart disease    PLAN:    In order of problems listed above:  No symptoms or current evidence of atrial fibrillation.  Patient has been intolerant to rivaroxaban and apixaban in the past.  See last year's note for details, but he has declined further anticoagulation.  He will continue aspirin 81 mg daily.  Continue metoprolol succinate.  Follow-up 1 year. Check 2D echo.  Stable after surgical repair. Blood pressure well-controlled on combination of hydrochlorothiazide, losartan, and metoprolol succinate.  Recent labs reviewed with creatinine 1.19 and potassium of 4.2.  Continue current management. Patient with history of mitral valve repair and redo bioprosthetic aortic valve replacement, 10 years out from his most recent heart surgery.  Check 2D echo.  Exam suggests normal function of his heart valves.  As long as his echo is stable, anticipate follow-up in 1 year.     Medication Adjustments/Labs and Tests Ordered: Current medicines are reviewed at length with the patient today.  Concerns regarding medicines are outlined above.  Orders Placed This Encounter  Procedures   EKG 12-Lead   ECHOCARDIOGRAM COMPLETE   No orders of the defined types were  placed in this encounter.   Patient Instructions  Medication  Instructions:  Your physician recommends that you continue on your current medications as directed. Please refer to the Current Medication list given to you today.  *If you need a refill on your cardiac medications before your next appointment, please call your pharmacy*   Testing/Procedures: Your physician has requested that you have an echocardiogram. Echocardiography is a painless test that uses sound waves to create images of your heart. It provides your doctor with information about the size and shape of your heart and how well your heart's chambers and valves are working. This procedure takes approximately one hour. There are no restrictions for this procedure. Please do NOT wear cologne, perfume, aftershave, or lotions (deodorant is allowed). Please arrive 15 minutes prior to your appointment time.    Follow-Up: At [facility], you and your health needs are our priority.  As part of our continuing mission to provide you with exceptional heart care, we have created designated Provider Care Teams.  These Care Teams include your primary Cardiologist (physician) and Advanced Practice Providers (APPs -  Physician Assistants and Nurse Practitioners) who all work together to provide you with the care you need, when you need it.  We recommend signing up for the patient portal called "MyChart".  Sign up information is provided on this After Visit Summary.  MyChart is used to connect with patients for Virtual Visits (Telemedicine).  Patients are able to view lab/test results, encounter notes, upcoming appointments, etc.  Non-urgent messages can be sent to your provider as well.   To learn more about what you can do with MyChart, go to [email or web address].    Your next appointment:   1 year(s)  Provider:   Tonny Bollman, MD        Signed, Tonny Bollman, MD  09/05/2022 8:27 AM    [facility]

## 2022-09-05 NOTE — Patient Instructions (Addendum)
Medication Instructions:  Your physician recommends that you continue on your current medications as directed. Please refer to the Current Medication list given to you today.  *If you need a refill on your cardiac medications before your next appointment, please call your pharmacy*   Testing/Procedures: Your physician has requested that you have an echocardiogram. Echocardiography is a painless test that uses sound waves to create images of your heart. It provides your doctor with information about the size and shape of your heart and how well your heart's chambers and valves are working. This procedure takes approximately one hour. There are no restrictions for this procedure. Please do NOT wear cologne, perfume, aftershave, or lotions (deodorant is allowed). Please arrive 15 minutes prior to your appointment time.    Follow-Up: At [facility], you and your health needs are our priority.  As part of our continuing mission to provide you with exceptional heart care, we have created designated Provider Care Teams.  These Care Teams include your primary Cardiologist (physician) and Advanced Practice Providers (APPs -  Physician Assistants and Nurse Practitioners) who all work together to provide you with the care you need, when you need it.  We recommend signing up for the patient portal called "MyChart".  Sign up information is provided on this After Visit Summary.  MyChart is used to connect with patients for Virtual Visits (Telemedicine).  Patients are able to view lab/test results, encounter notes, upcoming appointments, etc.  Non-urgent messages can be sent to your provider as well.   To learn more about what you can do with MyChart, go to [email or web address].    Your next appointment:   1 year(s)  Provider:   Tonny Bollman, MD

## 2022-10-08 ENCOUNTER — Ambulatory Visit (HOSPITAL_COMMUNITY): Payer: PPO | Attending: Cardiovascular Disease

## 2022-10-08 DIAGNOSIS — I10 Essential (primary) hypertension: Secondary | ICD-10-CM | POA: Diagnosis not present

## 2022-10-08 DIAGNOSIS — I7121 Aneurysm of the ascending aorta, without rupture: Secondary | ICD-10-CM | POA: Diagnosis not present

## 2022-10-08 DIAGNOSIS — I38 Endocarditis, valve unspecified: Secondary | ICD-10-CM

## 2022-10-08 DIAGNOSIS — I480 Paroxysmal atrial fibrillation: Secondary | ICD-10-CM

## 2022-10-08 LAB — ECHOCARDIOGRAM COMPLETE
AR max vel: 2.03 cm2
AV Area VTI: 2.26 cm2
AV Area mean vel: 2.22 cm2
AV Mean grad: 9 mmHg
AV Peak grad: 18 mmHg
Ao pk vel: 2.12 m/s
Area-P 1/2: 2.95 cm2
S' Lateral: 2.4 cm

## 2024-01-30 ENCOUNTER — Encounter: Payer: Self-pay | Admitting: Cardiovascular Disease

## 2024-01-30 ENCOUNTER — Ambulatory Visit: Attending: Cardiovascular Disease | Admitting: Cardiovascular Disease

## 2024-01-30 VITALS — BP 136/90 | Ht 66.0 in | Wt 211.8 lb

## 2024-01-30 DIAGNOSIS — I7121 Aneurysm of the ascending aorta, without rupture: Secondary | ICD-10-CM

## 2024-01-30 DIAGNOSIS — I10 Essential (primary) hypertension: Secondary | ICD-10-CM

## 2024-01-30 DIAGNOSIS — Z953 Presence of xenogenic heart valve: Secondary | ICD-10-CM | POA: Diagnosis not present

## 2024-01-30 DIAGNOSIS — Z9889 Other specified postprocedural states: Secondary | ICD-10-CM | POA: Diagnosis not present

## 2024-01-30 NOTE — Patient Instructions (Addendum)
 Medication Instructions:  No medication changes today at this appointment *If you need a refill on your cardiac medications before your next appointment, please call your pharmacy*  Lab Work: None ordered today. If you have labs (blood work) drawn today and your tests are completely normal, you will receive your results only by: MyChart Message (if you have MyChart) OR A paper copy in the mail If you have any lab test that is abnormal or we need to change your treatment, we will call you to review the results.  Testing/Procedures: Your physician has requested that you have an echocardiogram prior to 1 year follow up. Echocardiography is a painless test that uses sound waves to create images of your heart. It provides your doctor with information about the size and shape of your heart and how well your heart's chambers and valves are working. This procedure takes approximately one hour. There are no restrictions for this procedure. Please do NOT wear cologne, perfume, aftershave, or lotions (deodorant is allowed). Please arrive 15 minutes prior to your appointment time.  Please note: We ask at that you not bring children with you during ultrasound (echo/ vascular) testing. Due to room size and safety concerns, children are not allowed in the ultrasound rooms during exams. Our front office staff cannot provide observation of children in our lobby area while testing is being conducted. An adult accompanying a patient to their appointment will only be allowed in the ultrasound room at the discretion of the ultrasound technician under special circumstances. We apologize for any inconvenience.   Follow-Up: At [facility], you and your health needs are our priority.  As part of our continuing mission to provide you with exceptional heart care, our providers are all part of one team.  This team includes your primary Cardiologist (physician) and Advanced Practice Providers or APPs (Physician  Assistants and Nurse Practitioners) who all work together to provide you with the care you need, when you need it.  Your next appointment:   1 year(s)  Provider:   Ozell Fell, MD

## 2024-01-30 NOTE — Progress Notes (Unsigned)
 Cardiology Office Note:    Date:  01/30/2024   ID:  Thomas Henderson, DOB 01/01/1941, MRN [id number]  PCP:  Thomas Lamar LABOR, MD   [facility] Providers Cardiologist:  Thomas Fell, MD Cardiology APP:  Thomas Henderson     Referring MD: Thomas Lamar LABOR, MD   Chief Complaint  Patient presents with   Hypertension    History of Present Illness:    Thomas Henderson is a 83 y.o. male with a hx of:  Paroxysmal atrial fibrillation  Intol of Apixaban  >> Rivaroxaban  D/C'd rivaroxaban  secondary to 'feeling bad all over' Valvular heart disease S/p bioprosthetic AVR and MV repair in 2008 S/p redo bioprosthetic AVR in 2014 Ascending Thoracic Aortic Aneurysm  S/p repair of TAA in 2014  Coronary artery disease  Ectatic coronary arteries with diffuse non-obstructive CAD Hypertension  Hyperlipidemia  GERD   The patient is here alone today.  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  He denies any chest pain or pressure.  He has mild dyspnea when he first moves around but this clears as he continues to walk.  This is unchanged over time.  No orthopnea, PND, or leg swelling.  He has occasional heart palpitations.  Overall reports no interval changes since his visit last year.  Current Medications: Current Meds  Medication Sig   amoxicillin  (AMOXIL ) 500 MG tablet Take 500 mg by mouth as needed. Prior to dental work.   aspirin  EC 81 MG tablet Take 81 mg by mouth daily.   Cholecalciferol (VITAMIN D3) 2000 UNITS TABS Take 1 capsule by mouth daily.   cyanocobalamin (VITAMIN B12) 1000 MCG tablet Take 1,000 mcg by mouth daily. Per patient taking gummies   hydrochlorothiazide  (HYDRODIURIL ) 25 MG tablet Take 1 tablet (25 mg total) by mouth daily.   losartan  (COZAAR ) 50 MG tablet Take 1 tablet (50 mg total) by mouth daily.   meloxicam (MOBIC) 15 MG tablet Take 15 mg by mouth daily.   metoprolol  succinate (TOPROL -XL) 50 MG 24 hr tablet Take 50 mg by mouth daily. Take  with or immediately following a meal.   pregabalin (LYRICA) 50 MG capsule Take 50 mg by mouth 2 (two) times daily.   sildenafil  (VIAGRA ) 50 MG tablet Take 1 tablet (50 mg total) by mouth as needed.     Allergies:   Patient has no known allergies.   ROS:   Please see the history of present illness.    All other systems reviewed and are negative.  EKGs/Labs/Other Studies Reviewed:    The following studies were reviewed today: Cardiac Studies & Procedures   ______________________________________________________________________________________________     ECHOCARDIOGRAM  ECHOCARDIOGRAM COMPLETE 10/08/2022  Narrative ECHOCARDIOGRAM REPORT    Patient Name:   Thomas Henderson Date of Exam: 10/08/2022 Medical Rec #:  [id number]      Height:       66.0 in Accession #:    [id number]     Weight:       205.0 lb Date of Birth:  Oct 24, 1940       BSA:          2.021 m Patient Age:    82 years       BP:           124/82 mmHg Patient Gender: M              HR:           69 bpm. Exam Location:  [address]  Procedure: 2D Echo, Cardiac Doppler and Color Doppler  Indications:    I48.0 Paroxysmal atrial fibrillation  History:        Patient has prior history of Echocardiogram examinations, most recent 11/08/2020. CHF, CAD; Risk Factors:Hypertension and Dyslipidemia. Valvular heart disease. Aortic valve replacement (25mm Edwards Magna pericardial tissue valve). Mitral valve repair. Aneurysm of ascending aorta. S/P ascending aortic aneurysm repair. Near syncope. Aortic Valve: 25 mm Edwards MagnaEase valve is present in the aortic position.  Sonographer:    Thomas Henderson RCS Referring Phys: [phone] Thomas Henderson  IMPRESSIONS   1. Left ventricular ejection fraction, by estimation, is 60 to 65%. The left ventricle has normal function. The left ventricle has no regional wall motion abnormalities. There is mild concentric left ventricular hypertrophy. Left ventricular diastolic function could not  be evaluated. 2. Right ventricular systolic function is normal. The right ventricular size is normal. There is normal pulmonary artery systolic pressure. 3. Left atrial size was mildly dilated. 4. The mitral valve has been repaired/replaced. Trivial mitral valve regurgitation. No evidence of mitral stenosis. The mean mitral valve gradient is 4.0 mmHg with average heart rate of 68 bpm. 5. The aortic valve has been repaired/replaced. Aortic valve regurgitation is not visualized. No aortic stenosis is present. There is a 25 mm Edwards MagnaEase valve present in the aortic position. Echo findings are consistent with normal structure and function of the aortic valve prosthesis. Aortic valve area, by VTI measures 2.26 cm. Aortic valve mean gradient measures 9.0 mmHg. Aortic valve Vmax measures 2.12 m/s. 6. The inferior vena cava is normal in size with greater than 50% respiratory variability, suggesting right atrial pressure of 3 mmHg.  FINDINGS Left Ventricle: Left ventricular ejection fraction, by estimation, is 60 to 65%. The left ventricle has normal function. The left ventricle has no regional wall motion abnormalities. The left ventricular internal cavity size was normal in size. There is mild concentric left ventricular hypertrophy. Left ventricular diastolic function could not be evaluated due to mitral valve repair. Left ventricular diastolic function could not be evaluated.  Right Ventricle: The right ventricular size is normal. No increase in right ventricular wall thickness. Right ventricular systolic function is normal. There is normal pulmonary artery systolic pressure. The tricuspid regurgitant velocity is 1.02 m/s, and with an assumed right atrial pressure of 3 mmHg, the estimated right ventricular systolic pressure is 7.2 mmHg.  Left Atrium: Left atrial size was mildly dilated.  Right Atrium: Right atrial size was normal in size.  Pericardium: There is no evidence of pericardial  effusion.  Mitral Valve: The mitral valve has been repaired/replaced. Trivial mitral valve regurgitation. There is a prosthetic annuloplasty ring present in the mitral position. No evidence of mitral valve stenosis. MV peak gradient, 10.2 mmHg. The mean mitral valve gradient is 4.0 mmHg with average heart rate of 68 bpm.  Tricuspid Valve: The tricuspid valve is normal in structure. Tricuspid valve regurgitation is trivial. No evidence of tricuspid stenosis.  Aortic Valve: The aortic valve has been repaired/replaced. Aortic valve regurgitation is not visualized. No aortic stenosis is present. Aortic valve mean gradient measures 9.0 mmHg. Aortic valve peak gradient measures 18.0 mmHg. Aortic valve area, by VTI measures 2.26 cm. There is a 25 mm Edwards MagnaEase valve present in the aortic position. Echo findings are consistent with normal structure and function of the aortic valve prosthesis.  Pulmonic Valve: The pulmonic valve was normal in structure. Pulmonic valve regurgitation is mild. No evidence of pulmonic stenosis.  Aorta: The aortic root is normal in size and structure.  Venous: The inferior vena cava is normal in size with greater than 50% respiratory variability, suggesting right atrial pressure of 3 mmHg.  IAS/Shunts: No atrial level shunt detected by color flow Doppler.   LEFT VENTRICLE PLAX 2D LVIDd:         4.10 cm LVIDs:         2.40 cm LV PW:         1.10 cm LV IVS:        1.10 cm LVOT diam:     2.50 cm LV SV:         105 LV SV Index:   52 LVOT Area:     4.91 cm   RIGHT VENTRICLE RV S prime:     10.60 cm/s TAPSE (M-mode): 1.6 cm  LEFT ATRIUM             Index        RIGHT ATRIUM           Index LA diam:        5.30 cm 2.62 cm/m   RA Area:     16.00 cm LA Vol (A2C):   57.3 ml 28.35 ml/m  RA Volume:   41.30 ml  20.44 ml/m LA Vol (A4C):   62.2 ml 30.78 ml/m LA Biplane Vol: 60.8 ml 30.09 ml/m AORTIC VALVE                     PULMONIC VALVE AV Area (Vmax):     2.03 cm      PR End Diast Vel: 5.20 msec AV Area (Vmean):   2.22 cm AV Area (VTI):     2.26 cm AV Vmax:           212.00 cm/s AV Vmean:          131.500 cm/s AV VTI:            0.465 m AV Peak Grad:      18.0 mmHg AV Mean Grad:      9.0 mmHg LVOT Vmax:         87.80 cm/s LVOT Vmean:        59.400 cm/s LVOT VTI:          0.214 m LVOT/AV VTI ratio: 0.46  AORTA Ao Asc diam: 3.30 cm  MITRAL VALVE                TRICUSPID VALVE MV Area (PHT): 2.95 cm     TR Peak grad:   4.2 mmHg MV Peak grad:  10.2 mmHg    TR Vmax:        102.00 cm/s MV Mean grad:  4.0 mmHg MV Vmax:       1.60 m/s     SHUNTS MV Vmean:      92.8 cm/s    Systemic VTI:  0.21 m MV Decel Time: 257 msec     Systemic Diam: 2.50 cm MV E velocity: 167.00 cm/s MV A velocity: 98.50 cm/s MV E/A ratio:  1.70  Annabella Scarce MD Electronically signed by Annabella Scarce MD Signature Date/Time: 10/08/2022/11:38:55 AM    Final    MONITORS  LONG TERM MONITOR-LIVE TELEMETRY (3-14 DAYS) 12/09/2019  Narrative 1. The basic rhythm is normal sinus with an average HR of 83 bpm 2. No atrial fibrillation or flutter 3. No high-grade heart block or pathologic pauses 4. There are rare PVC's with few short 3-4 beat  ventricular runs 5. There are rare supraventricular runs, none longer than 5 beats  Benign monitor result       ______________________________________________________________________________________________      EKG:   EKG Interpretation Date/Time:  Thursday January 30 2024 16:28:36 EDT Ventricular Rate:  71 PR Interval:  174 QRS Duration:  84 QT Interval:  400 QTC Calculation: 434 R Axis:   28  Text Interpretation: Normal sinus rhythm T wave abnormality, consider inferior ischemia When compared with ECG of 31-Oct-2012 06:55, Vent. rate has increased BY  27 BPM T wave inversion now evident in Inferior leads Nonspecific T wave abnormality, improved in Lateral leads QT has lengthened Confirmed by Wonda Sharper [phone]) on 01/30/2024 4:40:45 PM    Recent Labs: No results found for requested labs within last 365 days.  Recent Lipid Panel    Component Value Date/Time   CHOL 97 11/20/2013 0952   TRIG 74.0 11/20/2013 0952   HDL 28.40 (L) 11/20/2013 0952   CHOLHDL 3 11/20/2013 0952   VLDL 14.8 11/20/2013 0952   LDLCALC 54 11/20/2013 0952            Physical Exam:    VS:  BP (!) 136/90 (BP Location: Left Arm, Patient Position: Sitting)   Ht 5' 6 (1.676 m)   Wt 211 lb 12.8 oz (96.1 kg)   SpO2 99%   BMI 34.19 kg/m     Wt Readings from Last 3 Encounters:  01/30/24 211 lb 12.8 oz (96.1 kg)  09/05/22 205 lb (93 kg)  08/18/21 209 lb 9.6 oz (95.1 kg)     GEN:  Well nourished, well developed in no acute distress HEENT: Normal NECK: No JVD; No carotid bruits LYMPHATICS: No lymphadenopathy CARDIAC: RRR, 2/6 ejection murmur at the right upper sternal border RESPIRATORY:  Clear to auscultation without rales, wheezing or rhonchi  ABDOMEN: Soft, non-tender, non-distended MUSCULOSKELETAL:  No edema; No deformity  SKIN: Warm and dry NEUROLOGIC:  Alert and oriented x 3 PSYCHIATRIC:  Normal affect   Assessment & Plan S/P mitral valve repair Echocardiogram last year showed no evidence of mitral stenosis and only trivial mitral regurgitation, with a mean transmitral gradient of 4 mmHg. S/P aortic valve replacement with bioprosthetic valve The patient's aortic valve was functioning normally at the time of his last echo with a mean gradient of 9 mmHg and no paravalvular regurgitation.  He has previously been treated with a 25 mm [facility] Ease valve.  I have recommended a repeat echocardiogram next year since he is now greater than 10 years out from bioprosthetic aortic valve replacement. Aneurysm of ascending aorta without rupture Patient status postsurgical repair.  Last year's echocardiogram showed normal aortic dimensions. Essential hypertension Blood pressure reported to be  occasionally above 140 mmHg, but he reports that diastolic pressures have been normal.  He will remain on losartan , hydrochlorothiazide , and metoprolol  succinate at current doses.            Medication Adjustments/Labs and Tests Ordered: Current medicines are reviewed at length with the patient today.  Concerns regarding medicines are outlined above.  Orders Placed This Encounter  Procedures   EKG 12-Lead   ECHOCARDIOGRAM COMPLETE   No orders of the defined types were placed in this encounter.   Patient Instructions  Medication Instructions:  No medication changes today at this appointment *If you need a refill on your cardiac medications before your next appointment, please call your pharmacy*  Lab Work: None ordered today. If you have labs (blood work) drawn  today and your tests are completely normal, you will receive your results only by: MyChart Message (if you have MyChart) OR A paper copy in the mail If you have any lab test that is abnormal or we need to change your treatment, we will call you to review the results.  Testing/Procedures: Your physician has requested that you have an echocardiogram prior to 1 year follow up. Echocardiography is a painless test that uses sound waves to create images of your heart. It provides your doctor with information about the size and shape of your heart and how well your heart's chambers and valves are working. This procedure takes approximately one hour. There are no restrictions for this procedure. Please do NOT wear cologne, perfume, aftershave, or lotions (deodorant is allowed). Please arrive 15 minutes prior to your appointment time.  Please note: We ask at that you not bring children with you during ultrasound (echo/ vascular) testing. Due to room size and safety concerns, children are not allowed in the ultrasound rooms during exams. Our front office staff cannot provide observation of children in our lobby area while testing is  being conducted. An adult accompanying a patient to their appointment will only be allowed in the ultrasound room at the discretion of the ultrasound technician under special circumstances. We apologize for any inconvenience.   Follow-Up: At [facility], you and your health needs are our priority.  As part of our continuing mission to provide you with exceptional heart care, our providers are all part of one team.  This team includes your primary Cardiologist (physician) and Advanced Practice Providers or APPs (Physician Assistants and Nurse Practitioners) who all work together to provide you with the care you need, when you need it.  Your next appointment:   1 year(s)  Provider:   Ozell Fell, MD          Signed, Thomas Fell, MD  01/30/2024 4:57 PM    [facility]

## 2024-01-30 NOTE — Assessment & Plan Note (Signed)
 Patient status postsurgical repair.  Last year's echocardiogram showed normal aortic dimensions.

## 2024-01-30 NOTE — Assessment & Plan Note (Signed)
 Blood pressure reported to be occasionally above 140 mmHg, but he reports that diastolic pressures have been normal.  He will remain on losartan , hydrochlorothiazide , and metoprolol  succinate at current doses.

## 2024-01-30 NOTE — Assessment & Plan Note (Signed)
 Echocardiogram last year showed no evidence of mitral stenosis and only trivial mitral regurgitation, with a mean transmitral gradient of 4 mmHg.

## 2024-01-30 NOTE — Assessment & Plan Note (Signed)
 The patient's aortic valve was functioning normally at the time of his last echo with a mean gradient of 9 mmHg and no paravalvular regurgitation.  He has previously been treated with a 25 mm [facility] Ease valve.  I have recommended a repeat echocardiogram next year since he is now greater than 10 years out from bioprosthetic aortic valve replacement.
